# Patient Record
Sex: Male | Born: 1935
Health system: Southern US, Community
[De-identification: ages and names within clinical notes are randomized; demographics above are authoritative.]

## PROBLEM LIST (undated history)

## (undated) DIAGNOSIS — I10 Essential (primary) hypertension: Secondary | ICD-10-CM

## (undated) DIAGNOSIS — C801 Malignant (primary) neoplasm, unspecified: Secondary | ICD-10-CM

## (undated) DIAGNOSIS — R251 Tremor, unspecified: Secondary | ICD-10-CM

## (undated) DIAGNOSIS — G709 Myoneural disorder, unspecified: Secondary | ICD-10-CM

## (undated) DIAGNOSIS — L910 Hypertrophic scar: Secondary | ICD-10-CM

## (undated) DIAGNOSIS — M1990 Unspecified osteoarthritis, unspecified site: Secondary | ICD-10-CM

## (undated) DIAGNOSIS — R413 Other amnesia: Secondary | ICD-10-CM

## (undated) HISTORY — DX: Other amnesia: R41.3

## (undated) HISTORY — PX: HERNIA REPAIR: SHX51

## (undated) HISTORY — PX: EYE SURGERY: SHX253

## (undated) HISTORY — DX: Tremor, unspecified: R25.1

---

## 2000-02-25 ENCOUNTER — Observation Stay (HOSPITAL_COMMUNITY): Admission: RE | Admit: 2000-02-25 | Discharge: 2000-02-26 | Payer: Self-pay | Admitting: General Surgery

## 2000-11-24 ENCOUNTER — Encounter: Admission: RE | Admit: 2000-11-24 | Discharge: 2000-11-24 | Payer: Self-pay | Admitting: Orthopaedic Surgery

## 2000-11-24 ENCOUNTER — Encounter: Payer: Self-pay | Admitting: Orthopaedic Surgery

## 2004-07-16 ENCOUNTER — Ambulatory Visit (HOSPITAL_COMMUNITY): Admission: RE | Admit: 2004-07-16 | Discharge: 2004-07-16 | Payer: Self-pay | Admitting: Urology

## 2004-08-04 ENCOUNTER — Ambulatory Visit: Admission: RE | Admit: 2004-08-04 | Discharge: 2004-08-21 | Payer: Self-pay | Admitting: Radiation Oncology

## 2004-09-16 ENCOUNTER — Ambulatory Visit: Admission: RE | Admit: 2004-09-16 | Discharge: 2004-12-05 | Payer: Self-pay | Admitting: Radiation Oncology

## 2005-01-01 ENCOUNTER — Ambulatory Visit: Admission: RE | Admit: 2005-01-01 | Discharge: 2005-01-01 | Payer: Self-pay | Admitting: Radiation Oncology

## 2011-08-17 ENCOUNTER — Other Ambulatory Visit: Payer: Self-pay | Admitting: Ophthalmology

## 2011-08-18 ENCOUNTER — Other Ambulatory Visit: Payer: Self-pay | Admitting: Ophthalmology

## 2011-08-18 NOTE — H&P (Signed)
H I S T O R Y   A N D   P H Y S I C A L  Patient's Name Todd Holmes Date   08/12/2011  Patient's Age 77 Time 18:19  Medical Record # [id number] Doctor Chalmers Guest   SUBJECTIVE: Referred By:   Chief Complaint: Cataract  Blurry vision at times(maybe due to TrobraDex St  ointment used qhs OU) A-scan OD schedule cat.surgery OD     Onset: Months   Duration:  Intermittent    Quality: Worse   Location:  Right   Intensity / Severity: Moderate    Mental Status: Oriented to Time, Person, Place     REVIEW OF SYSTEMS:     GEN:  Cancer:PROSTATE  Associated/Constitutional: negative.   Endocrine:negative.  Hematologic/Lymphatic:negative.  Allergic/Immunologic:negative.  JYN:WGNFAOZH.  Eyes:  LUNGS/Respiratory:negative.  HEART/Cardiovascular:negative.  ABD/Gastrointestinal:negative.  GENT/Genitourinary:negative.  BJE/Musculoskeletal: reportedArthritis.  SKIN/Integumentary:negative.  NEURO:negative  ACTIVE PROBLEMS: Nuclear sclerosis   ICD#366.16  .OD cataract  causing double vision, along with dryness   with up gaze Hypotropia   ICD#378.32    Drusen (degenerative)   ICD#362.57   Madarosis   ICD#374.55  SURGERIES: Cataract surgery with intraocular lens implant OS July 07, 2006  MEDICATIONS: AzaSite: 1% (solution) SIG-  one drop Ou qhs    TobraDex ST: Strength-  SIG-  OU qhs  DISCONTINUED MEDICATIONS:  not found  ALLERGIES: Drug Allergies.  No Known.  INTERVENTIONS: Refraction: 10.25.2012 OD: +2.50 + .050 x 150.....20/40 OS: +0.50 sph..................20/20 Add: 3.00  OD plus 1.25 combine with plus 0.50 axis 150 20/40 OS plus 0.25 combined with plus 0.50 axis 152 20/20                     add +3.00  SOCIAL HISTORY: Retired Mudlogger  OBJECTIVE: Vision:   OD: Acuity: With Correction -2 20/50 PinHole: 20/40  OS: Acuity: With Correction 20/20   Habitual Prescription: OD: +1.25 + 0.50 x 150 OS: +0.25 + 0.50 x  152 Add:  AutoRefraction: OD:  OS:  Refraction: 10.25.2012 OD: +2.50 + .050 x 150.....20/40 OS: +0.50 sph..................20/20 Add: 3.00  Visual  Fields: OD:  Visual Fields: Full To Confrontation OS:  Visual Fields: Full To Confrontation  Lids and Lashes:   Clear   Motility: Full with mild hypotropia upgage worse with head tilted to the right    SLIT-LAMP EXAMINATION  Conjunctiva OD: Conjunctiva:  Clear  OS: Conjunctiva:  Clear   Pupils: OD: Pupils:   3mm __ millimeters not detected Page Spiro   OS: Pupils:   3mm    __ millimeters not detected Page Spiro  Iris: OD: IrisManson Passey  OS: Iris:  Brown   Cornea: OD: Cornea: Arcus  OS: Cornea: Arcus    Anterior Chamber: OD: Anterior Chamber  deep & quiet  OS: Anterior Chamber  deep & quiet   Lens: OD: 2+ -3+  Nuclear Sclerotic Anterior Vacular  OS: Posterior Chamber IntraOcular Lens   Vitreous: OD: Vitreous detachment   ICD#379.21    OS:  Vitreous:   POSTERIOR VITREOUS DETACHMENT #379.21.   Intra-Ocular Pressures in mm of Mercury:  Target: Today: OD:12 OS:12 Time:08/12/2011 16:52   Central Corneal Thickness:  Gonio: OD: Gonio Open 360 Degrees to Scleral Spur   OS: Gonio Open 360 Degrees to Scleral Spur    Dilation Phenylephrine 2.5% Tropicamide   Optic Discs: OD: Optic Discs 25% cup pink OS: Optic Discs  25% cup pink  Macula: OD: Macula:  drusen OS: Macula: Drusen   Retina and Vessels:drusen OU OD: Retina and Vessels Clear OS: Retina and Vessels  Clear  Periphery: OD: Periphery Clear OS: Periphery Clear      PHYSICAL  Blood Pressure:130/82  Pulse:68  Respiration:20  GENERAL: No acute distress  HEENT: MM's WNL, TM's normal, PERLA, Conjunctiva WNL, Fundi  As Before  note above  NECK: Full ROM, no adenopathy or masses, Thyroid WNL  LUNGS: Clear to auscultation, equal BS  HEART: RR&R, no murmur, gallop, or rub, not enlarged  ABD: Normal  GENT: deferred  BJE:  normal  NEURO: CN II through XII intact  SKIN: normal  STUDIES:  A-Scan  ASSESSMENT: Nuclear sclerosis   ICD#366.16  .OD cataract  causing double vision, along with dryness   with up gaze Hypotropia   ICD#378.32    Drusen (degenerative)   ICD#362.57   Madarosis   ICD#374.55  PLAN: A-scan, k's today  Discussed the procedure with the patient and he decided to proceed.  Schedule for phacoemulsification with intraocular lens implant [facility] on August 26, 2011.    MEDICATION: ocuvite  TobraDex ST: Strength-  SIG-  OU qhs Dispense-   Substitutions-  Refills- 0 Notes-  FOLLOW UP:    One Day PostOp    Printed on 08/18/2011 at 18:19   Blake Divine MD

## 2011-08-25 ENCOUNTER — Encounter (HOSPITAL_COMMUNITY): Payer: Self-pay

## 2011-08-25 MED ORDER — PHENYLEPHRINE HCL 2.5 % OP SOLN: 1.0000 [drp] | OPHTHALMIC | Status: AC

## 2011-08-25 MED ORDER — KETOROLAC TROMETHAMINE 0.5 % OP SOLN: 1.0000 [drp] | OPHTHALMIC | Status: AC

## 2011-08-25 MED ORDER — CYCLOPENTOLATE-PHENYLEPHRINE 0.2-1 % OP SOLN: 1.0000 [drp] | OPHTHALMIC | Status: AC

## 2011-08-26 ENCOUNTER — Encounter (HOSPITAL_COMMUNITY): Payer: Self-pay

## 2011-08-26 ENCOUNTER — Other Ambulatory Visit: Payer: Self-pay

## 2011-08-26 ENCOUNTER — Ambulatory Visit (HOSPITAL_COMMUNITY)
Admission: RE | Admit: 2011-08-26 | Discharge: 2011-08-26 | Disposition: A | Discharge status: Home / Self Care | Payer: Medicare Other | Source: Ambulatory Visit | Attending: Ophthalmology | Admitting: Ophthalmology

## 2011-08-26 DIAGNOSIS — Z538 Procedure and treatment not carried out for other reasons: Secondary | ICD-10-CM | POA: Insufficient documentation

## 2011-08-26 DIAGNOSIS — H269 Unspecified cataract: Secondary | ICD-10-CM | POA: Insufficient documentation

## 2011-08-26 DIAGNOSIS — Z01810 Encounter for preprocedural cardiovascular examination: Secondary | ICD-10-CM | POA: Insufficient documentation

## 2011-08-26 HISTORY — DX: Myoneural disorder, unspecified: G70.9

## 2011-08-26 HISTORY — DX: Hypertrophic scar: L91.0

## 2011-08-26 HISTORY — DX: Unspecified osteoarthritis, unspecified site: M19.90

## 2011-08-26 HISTORY — DX: Malignant (primary) neoplasm, unspecified: C80.1

## 2011-08-26 MED ORDER — PHENYLEPHRINE HCL 2.5 % OP SOLN
OPHTHALMIC | Status: AC
  Filled 2011-08-26: qty 3

## 2011-08-26 MED ORDER — CYCLOPENTOLATE-PHENYLEPHRINE 0.2-1 % OP SOLN
OPHTHALMIC | Status: AC
  Filled 2011-08-26: qty 2

## 2011-08-26 MED ORDER — KETOROLAC TROMETHAMINE 0.5 % OP SOLN
OPHTHALMIC | Status: AC
  Filled 2011-08-26: qty 5

## 2011-08-26 NOTE — Progress Notes (Signed)
Asked A. Zelenak,PAC to review EKG, per pt. last ekg- 1980's. Pt .reports that he regularly sees Dr. Earl Gala, [facility] Fearing

## 2011-08-26 NOTE — Progress Notes (Signed)
Pt. Being d/c'd today, surgery not done to sch. Problem, per  Dr Harlon Flor.  Pt. Aware that MD will followup with date & time.

## 2011-08-26 NOTE — Patient Instructions (Signed)
Pt. Informed that surgery is going to be rescheduled for January 2, MD office  will call pt. To confirm & pt. Is aware.

## 2011-08-28 NOTE — Consult Note (Addendum)
Anesthesia:  Patient is an 77 year old male who was initially scheduled for phacoemulsification with intraocular lens for a right cataract on 08/26/11.  There were some OR delays however, and Dr. Harlon Flor decided to reschedule the procedure for 09/16/11.  An EKG was done as part of his preoperative evaluation, but other testing was deferred since his procedure was canceled.  I was asked to review his EKG showing SB with bifascicular block, LVH with QRS widening and repolarization abnormality.  He has rather diffuse T wave abnormality, but more prominent in lateral leads.  These findings appear similar to an old EKG from 02/23/00 which was done prior to him undergoing left IHR.  He did not report any other cardiac studies.  His history includes OSA, OA, GERD, prostate CA, and benign tremors.  Dr. Theressa Millard is his PCP.  He was prescribed Corgard for his benign tremors, not for history of HTN.  I reviewed his history and prior EKGs with Dr. Jean Rosenthal, who agrees that since EKGs are stable, Anesthesia would not require any additional cardiac testing preoperatively unless he developed new CV symptoms.  I was unable to reach him today, so I will attempt to contact him next week.    CXR and labs to be done when he returns for PAT appointment or surgery.  Addendum:  09/01/11 1700 I called to speak with Mr. Thayer Jew.  He reports a prior stress test but over five years ago.  When asked about CV symptoms he reports an occasional left sided chest "ache" that typically only lasts a few seconds up to a minute.  This occurs about once per month over the past several months.  Since his symptoms were so mild and rather infrequent, he has not been impressed to discuss them with Dr. Earl Gala.  He has not noticed a particular pattern with his symptoms, and they resolved spontaneously.  He has not had any associated jaw or left arm pain, diaphoresis, nausea, or palpitations.  He has had occasional left back pain located at the  location of his scapula, but this has not been associated with other symptoms.  He does not do daily aerobic activity, but walks laps at the mall on occasion without difficulty.  This summer he was able to mow his lawn with a self propelled push lawn mower.  He did not have any CP with this, and only described becoming a little SOB if it was humid and he was going up an incline.  He is asymptomatic currently.  He is aware that he should contact Dr. Earl Gala or EMS in the event of persistent or worsening symptoms.  I reviewed above and his prior EKGs with Dr. Ivin Booty.  This procedure is posted to be done under MAC.  Again, his EKG is felt overall stable, and he has reasonable exercise tolerance.  At this point, Anesthesia feels Mr. Pantaleo can proceed to the OR if his labs are CXR results are acceptable and if he does not have progression of his symptoms.

## 2011-09-10 ENCOUNTER — Other Ambulatory Visit: Payer: Self-pay | Admitting: Ophthalmology

## 2011-09-10 NOTE — H&P (Signed)
   Chief Complaint: Cataract  Blurry vision OD   Onset: Months   Duration:  Intermittent    Quality: Worse   Location:  Right   Intensity / Severity: Moderate    Mental Status: Oriented to Time, Person, Place     REVIEW OF SYSTEMS:     GEN:  Cancer:PROSTATE  Associated/Constitutional: negative.   Endocrine:negative.  Hematologic/Lymphatic:negative.  Allergic/Immunologic:negative.  BJY:NWGNFAOZ.  Eyes:  LUNGS/Respiratory:negative.  HEART/Cardiovascular:negative.  ABD/Gastrointestinal:negative.  GENT/Genitourinary:negative.  BJE/Musculoskeletal: (+)Arthritis.  SKIN/Integumentary:negative.  NEURO:negative  OBjECTIVE:   Vision:   OD: Acuity: With Correction -2 20/50 PinHole: 20/40  OS: Acuity: With Correction 20/20   Habitual Prescription: OD: +1.25 + 0.50 x 150 OS: +0.25 + 0.50 x 152 Add:  AutoRefraction: OD:  OS:  Refraction: 10.25.2012 OD: +2.50 + .050 x 150.....20/40 OS: +0.50 sph..................20/20 Add: 3.00  Visual  Fields: OD:  Visual Fields: Full To Confrontation OS:  Visual Fields: Full To Confrontation  Lids and Lashes:   Clear   Motility: Full with mild hypotropia upgage worse with head tilted to the right    SLIT-LAMP EXAMINATION  Conjunctiva OD: Conjunctiva:  Clear  OS: Conjunctiva:  Clear   Pupils: OD: Pupils:   3mm __ millimeters (-) Page Spiro   OS: Pupils:   3mm    __ millimeters (-) Page Spiro  Iris: OD: IrisManson Passey  OS: Iris:  Brown   Cornea: OD: Cornea: Arcus  OS: Cornea: Arcus    Anterior Chamber: OD: Anterior Chamber  deep & quiet  OS: Anterior Chamber  deep & quiet   Lens: OD: 2+ -3+  Nuclear Sclerotic Anterior Vacular  OS: Posterior Chamber IntraOcular Lens   Vitreous: OD: Vitreous detachment   ICD#379.21    OS:  Vitreous:   POSTERIOR VITREOUS DETACHMENT #379.21.   Intra-Ocular Pressures in mm of Mercury: OD:12 OS:12 Time:08/12/2011 16:52   Gonio: OD: Gonio Open 360 Degrees to Scleral Spur     OS: Gonio Open 360 Degrees to Scleral Spur    Dilation Phenylephrine 2.5% Tropicamide   Optic Discs: OD: Optic Discs 25% cup pink OS: Optic Discs  25% cup pink  Macula: OD: Macula: drusen OS: Macula: Drusen   Retina and Vessels:drusen OU OD: Retina and Vessels Clear OS: Retina and Vessels  Clear  Periphery: OD: Periphery Clear OS: Periphery Clear      PHYSICAL  Blood Pressure:130/82  Pulse:68  Respiration:20  GENERAL: No acute distress  HEENT: MM's WNL, TM's normal, PERLA, Conjunctiva WNL, Fundi  As Before  note above  NECK: Full ROM, no adenopathy or masses, Thyroid WNL  LUNGS: Clear to auscultation, equal BS  HEART: RR&R, no murmur, gallop, or rub, not enlarged  ABD: Normal  GENT: deferred  BJE: normal  NEURO: CN II through XII intact  SKIN: normal  STUDIES:  A-Scan     ASSESSMENT:   Nuclear sclerosis   ICD#366.16  .OD cataract  causing double vision, along with dryness   with up gaze Hypotropia   ICD#378.32    Drusen (degenerative)   ICD#362.57   Madarosis   ICD#374.55         PLAN:  A-scan, k's today  Discussed the procedure with the patient and he decided to proceed.  Schedule for phacoemulsification with intraocular lens implant OD Hospital.   FOLLOW UP:  One Day PostOp

## 2011-09-11 ENCOUNTER — Encounter (HOSPITAL_COMMUNITY): Payer: Self-pay

## 2011-09-11 ENCOUNTER — Encounter (HOSPITAL_COMMUNITY)
Admission: RE | Admit: 2011-09-11 | Discharge: 2011-09-11 | Disposition: A | Discharge status: Home / Self Care | Payer: Medicare Other | Source: Ambulatory Visit | Attending: Ophthalmology | Admitting: Ophthalmology

## 2011-09-11 ENCOUNTER — Other Ambulatory Visit (HOSPITAL_COMMUNITY): Payer: Self-pay | Admitting: *Deleted

## 2011-09-11 ENCOUNTER — Ambulatory Visit (HOSPITAL_COMMUNITY)
Admission: RE | Admit: 2011-09-11 | Discharge: 2011-09-11 | Disposition: A | Discharge status: Home / Self Care | Payer: Medicare Other | Source: Ambulatory Visit | Attending: Anesthesiology | Admitting: Anesthesiology

## 2011-09-11 DIAGNOSIS — Z01818 Encounter for other preprocedural examination: Secondary | ICD-10-CM | POA: Insufficient documentation

## 2011-09-11 DIAGNOSIS — I77819 Aortic ectasia, unspecified site: Secondary | ICD-10-CM | POA: Insufficient documentation

## 2011-09-11 DIAGNOSIS — M4120 Other idiopathic scoliosis, site unspecified: Secondary | ICD-10-CM | POA: Insufficient documentation

## 2011-09-11 DIAGNOSIS — Z01812 Encounter for preprocedural laboratory examination: Secondary | ICD-10-CM | POA: Insufficient documentation

## 2011-09-11 LAB — BASIC METABOLIC PANEL
BUN: 14 mg/dL (ref 6–23)
CO2: 27 mEq/L (ref 19–32)
Calcium: 9.5 mg/dL (ref 8.4–10.5)
Glucose, Bld: 109 mg/dL — ABNORMAL HIGH (ref 70–99)
Sodium: 143 mEq/L (ref 135–145)

## 2011-09-11 LAB — CBC
MCH: 29.6 pg (ref 26.0–34.0)
MCHC: 33.8 g/dL (ref 30.0–36.0)
MCV: 87.8 fL (ref 78.0–100.0)
Platelets: 166 10*3/uL (ref 150–400)
RBC: 5.33 MIL/uL (ref 4.22–5.81)

## 2011-09-11 NOTE — Pre-Procedure Instructions (Addendum)
22 Todd Holmes  09/11/2011   Your procedure is scheduled on:  09/16/11  Report to [facility] at 800AM.  Call this number if you have problems the morning of surgery: [phone]   Remember:   Do not eat food:After Midnight.  May have clear liquids: up to 4 Hours before arrival.  Clear liquids include soda, tea, black coffee, apple or grape juice, broth.  Take these medicines the morning of surgery with A SIP OF WATER  :corgard, claritin   Do not wear jewelry, make-up or nail polish.  Do not wear lotions, powders, or perfumes. You may wear deodorant.  Do not shave 48 hours prior to surgery.  Do not bring valuables to the hospital.  Contacts, dentures or bridgework may not be worn into surgery.  Leave suitcase in the car. After surgery it may be brought to your room.  For patients admitted to the hospital, checkout time is 11:00 AM the day of discharge.   Patients discharged the day of surgery will not be allowed to drive home.  Name and phone number of your driver:earnie cain [phone]  Special Instructions: CHG Shower Use Special Wash: 1/2 bottle night before surgery and 1/2 bottle morning of surgery.   Please read over the following fact sheets that you were given: Pain Booklet, Coughing and Deep Breathing, MRSA Information and Surgical Site Infection Prevention

## 2011-09-11 NOTE — Progress Notes (Signed)
Patients surgery changed to 09/23/11 per pt req per office

## 2011-09-16 SURGERY — PHACOEMULSIFICATION, CATARACT, WITH IOL INSERTION
Anesthesia: Monitor Anesthesia Care | Laterality: Right

## 2011-09-18 ENCOUNTER — Other Ambulatory Visit: Payer: Self-pay | Admitting: Ophthalmology

## 2011-09-21 ENCOUNTER — Encounter (HOSPITAL_COMMUNITY): Payer: Self-pay | Admitting: *Deleted

## 2011-09-21 NOTE — Progress Notes (Signed)
Told patient to be here at 1000 on 09/23/2011 for surgery.  Patient has instructions that were given in preop.

## 2011-09-23 ENCOUNTER — Encounter (HOSPITAL_COMMUNITY)
Admission: RE | Disposition: A | Discharge status: Home / Self Care | Payer: Self-pay | Source: Ambulatory Visit | Attending: Ophthalmology

## 2011-09-23 ENCOUNTER — Encounter (HOSPITAL_COMMUNITY): Payer: Self-pay | Admitting: *Deleted

## 2011-09-23 ENCOUNTER — Ambulatory Visit (HOSPITAL_COMMUNITY)
Admission: RE | Admit: 2011-09-23 | Discharge: 2011-09-23 | Disposition: A | Discharge status: Home / Self Care | Payer: Medicare Other | Source: Ambulatory Visit | Attending: Ophthalmology | Admitting: Ophthalmology

## 2011-09-23 ENCOUNTER — Encounter (HOSPITAL_COMMUNITY): Payer: Self-pay | Admitting: Vascular Surgery

## 2011-09-23 ENCOUNTER — Ambulatory Visit (HOSPITAL_COMMUNITY): Payer: Medicare Other | Admitting: Vascular Surgery

## 2011-09-23 DIAGNOSIS — H269 Unspecified cataract: Secondary | ICD-10-CM | POA: Diagnosis not present

## 2011-09-23 DIAGNOSIS — H35369 Drusen (degenerative) of macula, unspecified eye: Secondary | ICD-10-CM | POA: Diagnosis not present

## 2011-09-23 DIAGNOSIS — H5052 Exophoria: Secondary | ICD-10-CM | POA: Diagnosis not present

## 2011-09-23 DIAGNOSIS — H2510 Age-related nuclear cataract, unspecified eye: Secondary | ICD-10-CM | POA: Diagnosis not present

## 2011-09-23 HISTORY — PX: CATARACT EXTRACTION W/PHACO: SHX586

## 2011-09-23 SURGERY — PHACOEMULSIFICATION, CATARACT, WITH IOL INSERTION
Anesthesia: Monitor Anesthesia Care | Site: Eye | Laterality: Right | Wound class: Clean

## 2011-09-23 MED ORDER — PROVISC 10 MG/ML IO SOLN
INTRAOCULAR | Status: DC | PRN
  Administered 2011-09-23: .85 mL via INTRAOCULAR

## 2011-09-23 MED ORDER — KETOROLAC TROMETHAMINE 0.5 % OP SOLN
OPHTHALMIC | Status: AC
  Administered 2011-09-23 (×2): 1 [drp]
  Filled 2011-09-23: qty 5

## 2011-09-23 MED ORDER — TOBRAMYCIN 0.3 % OP OINT
TOPICAL_OINTMENT | OPHTHALMIC | Status: DC | PRN
  Administered 2011-09-23: 1 via OPHTHALMIC

## 2011-09-23 MED ORDER — GATIFLOXACIN 0.5 % OP SOLN
OPHTHALMIC | Status: AC
  Administered 2011-09-23 (×2): 1 [drp]
  Filled 2011-09-23: qty 2.5

## 2011-09-23 MED ORDER — CYCLOPENTOLATE HCL 1 % OP SOLN
OPHTHALMIC | Status: AC
  Administered 2011-09-23 (×2): 1 [drp]
  Filled 2011-09-23: qty 2

## 2011-09-23 MED ORDER — NA CHONDROIT SULF-NA HYALURON 40-30 MG/ML IO SOLN
INTRAOCULAR | Status: DC | PRN
  Administered 2011-09-23: 0.5 mL via INTRAOCULAR

## 2011-09-23 MED ORDER — BALANCED SALT IO SOLN
INTRAOCULAR | Status: DC | PRN
  Administered 2011-09-23: 15 mL via INTRAOCULAR

## 2011-09-23 MED ORDER — HYALURONIDASE HUMAN 150 UNIT/ML IJ SOLN
INTRAMUSCULAR | Status: DC | PRN
  Administered 2011-09-23: 150 [IU] via SUBCUTANEOUS

## 2011-09-23 MED ORDER — PHENYLEPHRINE HCL 2.5 % OP SOLN
OPHTHALMIC | Status: AC
  Administered 2011-09-23 (×2): 1 [drp]
  Filled 2011-09-23: qty 3

## 2011-09-23 MED ORDER — TROPICAMIDE 1 % OP SOLN
OPHTHALMIC | Status: AC
  Administered 2011-09-23 (×2): 1 [drp]
  Filled 2011-09-23: qty 3

## 2011-09-23 MED ORDER — FENTANYL CITRATE 0.05 MG/ML IJ SOLN
INTRAMUSCULAR | Status: DC | PRN
  Administered 2011-09-23: 25 ug via INTRAVENOUS

## 2011-09-23 MED ORDER — PROPOFOL 10 MG/ML IV EMUL
INTRAVENOUS | Status: DC | PRN
  Administered 2011-09-23: 75 mg via INTRAVENOUS

## 2011-09-23 MED ORDER — BSS IO SOLN
INTRAOCULAR | Status: DC | PRN
  Administered 2011-09-23: 500 mL via INTRAOCULAR

## 2011-09-23 MED ORDER — LACTATED RINGERS IV SOLN: INTRAVENOUS | Status: DC

## 2011-09-23 MED ORDER — LIDOCAINE-EPINEPHRINE 2 %-1:100000 IJ SOLN
INTRAMUSCULAR | Status: DC | PRN
  Administered 2011-09-23: 4 mL

## 2011-09-23 MED ORDER — MIDAZOLAM HCL 5 MG/5ML IJ SOLN
INTRAMUSCULAR | Status: DC | PRN
  Administered 2011-09-23: 0.5 mg via INTRAVENOUS

## 2011-09-23 MED ORDER — SODIUM CHLORIDE 0.9 % IV SOLN
INTRAVENOUS | Status: DC | PRN
  Administered 2011-09-23: 12:00:00 via INTRAVENOUS
  Administered 2011-09-23: 500 mL

## 2011-09-23 MED ORDER — EPINEPHRINE HCL 1 MG/ML IJ SOLN
INTRAMUSCULAR | Status: DC | PRN
  Administered 2011-09-23: .3 mL

## 2011-09-23 MED ORDER — BUPIVACAINE HCL 0.75 % IJ SOLN
INTRAMUSCULAR | Status: DC | PRN
  Administered 2011-09-23: 4 mL

## 2011-09-23 MED ORDER — ACETYLCHOLINE CHLORIDE 1:100 IO SOLR
INTRAOCULAR | Status: DC | PRN
  Administered 2011-09-23: 2 mg via INTRAOCULAR

## 2011-09-23 SURGICAL SUPPLY — 41 items
APPLICATOR COTTON TIP 6IN STRL (MISCELLANEOUS) ×2 IMPLANT
APPLICATOR DR MATTHEWS STRL (MISCELLANEOUS) ×2 IMPLANT
BLADE KERATOME 2.75 (BLADE) ×2 IMPLANT
BLADE MINI RND TIP GREEN BEAV (BLADE) IMPLANT
BLADE STAB KNIFE 45DEG (BLADE) IMPLANT
CANNULA ANTERIOR CHAMBER 27GA (MISCELLANEOUS) ×2 IMPLANT
CLOTH BEACON ORANGE TIMEOUT ST (SAFETY) ×2 IMPLANT
CORDS BIPOLAR (ELECTRODE) IMPLANT
DRAPE OPHTHALMIC 40X48 W POUCH (DRAPES) ×2 IMPLANT
DRAPE RETRACTOR (MISCELLANEOUS) ×2 IMPLANT
EYE SHIELD UNIVERSAL CLEAR (GAUZE/BANDAGES/DRESSINGS) ×2 IMPLANT
FILTER BLUE MILLIPORE (MISCELLANEOUS) IMPLANT
GLOVE BIO SURGEON STRL SZ8 (GLOVE) ×2 IMPLANT
GLOVE BIO SURGEON STRL SZ8.5 (GLOVE) IMPLANT
GLOVE BIOGEL PI IND STRL 6.5 (GLOVE) ×1 IMPLANT
GLOVE BIOGEL PI INDICATOR 6.5 (GLOVE) ×1
GLOVE ECLIPSE 7.0 STRL STRAW (GLOVE) IMPLANT
GOWN STRL NON-REIN LRG LVL3 (GOWN DISPOSABLE) ×4 IMPLANT
KIT ROOM TURNOVER OR (KITS) ×2 IMPLANT
KNIFE CRESCENT 2.5 55 ANG (BLADE) ×2 IMPLANT
LENS IOL ACRSF IQ PC 20.5 (Intraocular Lens) ×1 IMPLANT
LENS IOL ACRYSOF IQ POST 20.5 (Intraocular Lens) ×2 IMPLANT
MARKER SKIN DUAL TIP RULER LAB (MISCELLANEOUS) ×2 IMPLANT
NEEDLE 18GX1X1/2 (RX/OR ONLY) (NEEDLE) ×2 IMPLANT
NEEDLE 25GX 5/8IN NON SAFETY (NEEDLE) ×2 IMPLANT
NS IRRIG 1000ML POUR BTL (IV SOLUTION) ×2 IMPLANT
PACK CATARACT CUSTOM (CUSTOM PROCEDURE TRAY) ×2 IMPLANT
PACK CATARACT MCHSCP (PACKS) ×2 IMPLANT
PAD ARMBOARD 7.5X6 YLW CONV (MISCELLANEOUS) ×2 IMPLANT
PAD EYE OVAL STERILE LF (GAUZE/BANDAGES/DRESSINGS) ×2 IMPLANT
PROBE ANTERIOR 20G W/INFUS NDL (MISCELLANEOUS) IMPLANT
SPEAR EYE SURG WECK-CEL (MISCELLANEOUS) IMPLANT
SUT ETHILON 10 0 CS140 6 (SUTURE) IMPLANT
SUT SILK 4 0 C 3 735G (SUTURE) IMPLANT
SUT SILK 6 0 G 6 (SUTURE) IMPLANT
SUT VICRYL 8 0 TG140 8 (SUTURE) IMPLANT
SYR 3ML LL SCALE MARK (SYRINGE) IMPLANT
SYR TB 1ML LUER SLIP (SYRINGE) ×2 IMPLANT
TAPE TRANSPARENT 1/2IN (GAUZE/BANDAGES/DRESSINGS) ×2 IMPLANT
TOWEL OR 17X24 6PK STRL BLUE (TOWEL DISPOSABLE) ×4 IMPLANT
WATER STERILE IRR 1000ML POUR (IV SOLUTION) ×2 IMPLANT

## 2011-09-23 NOTE — Anesthesia Preprocedure Evaluation (Addendum)
Anesthesia Evaluation  Patient identified by MRN, date of birth, ID band  Reviewed: Allergy & Precautions, H&P , NPO status , Patient's Chart, lab work & pertinent test results  Airway Mallampati: II      Dental   Pulmonary neg pulmonary ROS,  clear to auscultation        Cardiovascular Regular Normal    Neuro/Psych Negative Psych ROS   GI/Hepatic negative GI ROS, Neg liver ROS,   Endo/Other  Negative Endocrine ROS  Renal/GU negative Renal ROS   History of prostrate cancer with radiation.    Musculoskeletal   Abdominal   Peds  Hematology   Anesthesia Other Findings   Reproductive/Obstetrics                          Anesthesia Physical Anesthesia Plan  ASA: II  Anesthesia Plan: MAC   Post-op Pain Management:    Induction: Intravenous  Airway Management Planned: Mask  Additional Equipment:   Intra-op Plan:   Post-operative Plan:   Informed Consent: I have reviewed the patients History and Physical, chart, labs and discussed the procedure including the risks, benefits and alternatives for the proposed anesthesia with the patient or authorized representative who has indicated his/her understanding and acceptance.     Plan Discussed with: CRNA  Anesthesia Plan Comments:         Anesthesia Quick Evaluation

## 2011-09-23 NOTE — Interval H&P Note (Signed)
History and Physical Interval Note:  09/23/2011 12:28 PM  Todd Holmes  has presented today for surgery, with the diagnosis of cataract right eye  The various methods of treatment have been discussed with the patient and family. After consideration of risks, benefits and other options for treatment, the patient has consented to  Procedure(s): CATARACT EXTRACTION PHACO AND INTRAOCULAR LENS PLACEMENT (IOL) as a surgical intervention .  The patients' history has been reviewed, patient examined, no change in status, stable for surgery.  I have reviewed the patients' chart and labs.  Questions were answered to the patient's satisfaction.     Todd Holmes

## 2011-09-23 NOTE — Preoperative (Addendum)
Beta Blockers   Reason not to administer Beta Blockers:Not Applicable 

## 2011-09-23 NOTE — H&P (View-Only) (Signed)
   Chief Complaint: Cataract  Blurry vision OD   Onset: Months   Duration:  Intermittent    Quality: Worse   Location:  Right   Intensity / Severity: Moderate    Mental Status: Oriented to Time, Person, Place     REVIEW OF SYSTEMS:     GEN:  Cancer:PROSTATE  Associated/Constitutional: negative.   Endocrine:negative.  Hematologic/Lymphatic:negative.  Allergic/Immunologic:negative.  ENT:negative.  Eyes:  LUNGS/Respiratory:negative.  HEART/Cardiovascular:negative.  ABD/Gastrointestinal:negative.  GENT/Genitourinary:negative.  BJE/Musculoskeletal: (+)Arthritis.  SKIN/Integumentary:negative.  NEURO:negative  OBjECTIVE:   Vision:   OD: Acuity: With Correction -2 20/50 PinHole: 20/40  OS: Acuity: With Correction 20/20   Habitual Prescription: OD: +1.25 + 0.50 x 150 OS: +0.25 + 0.50 x 152 Add:  AutoRefraction: OD:  OS:  Refraction: 10.25.2012 OD: +2.50 + .050 x 150.....20/40 OS: +0.50 sph..................20/20 Add: 3.00  Visual  Fields: OD:  Visual Fields: Full To Confrontation OS:  Visual Fields: Full To Confrontation  Lids and Lashes:   Clear   Motility: Full with mild hypotropia upgage worse with head tilted to the right    SLIT-LAMP EXAMINATION  Conjunctiva OD: Conjunctiva:  Clear  OS: Conjunctiva:  Clear   Pupils: OD: Pupils:   3mm __ millimeters (-) Marcus Gunn   OS: Pupils:   3mm    __ millimeters (-) Marcus Gunn  Iris: OD: Iris:  Brown  OS: Iris:  Brown   Cornea: OD: Cornea: Arcus  OS: Cornea: Arcus    Anterior Chamber: OD: Anterior Chamber  deep & quiet  OS: Anterior Chamber  deep & quiet   Lens: OD: 2+ -3+  Nuclear Sclerotic Anterior Vacular  OS: Posterior Chamber IntraOcular Lens   Vitreous: OD: Vitreous detachment   ICD#379.21    OS:  Vitreous:   POSTERIOR VITREOUS DETACHMENT #379.21.   Intra-Ocular Pressures in mm of Mercury: OD:12 OS:12 Time:08/12/2011 16:52   Gonio: OD: Gonio Open 360 Degrees to Scleral Spur     OS: Gonio Open 360 Degrees to Scleral Spur    Dilation Phenylephrine 2.5% Tropicamide   Optic Discs: OD: Optic Discs 25% cup pink OS: Optic Discs  25% cup pink  Macula: OD: Macula: drusen OS: Macula: Drusen   Retina and Vessels:drusen OU OD: Retina and Vessels Clear OS: Retina and Vessels  Clear  Periphery: OD: Periphery Clear OS: Periphery Clear      PHYSICAL  Blood Pressure:130/82  Pulse:68  Respiration:20  GENERAL: No acute distress  HEENT: MM's WNL, TM's normal, PERLA, Conjunctiva WNL, Fundi  As Before  note above  NECK: Full ROM, no adenopathy or masses, Thyroid WNL  LUNGS: Clear to auscultation, equal BS  HEART: RR&R, no murmur, gallop, or rub, not enlarged  ABD: Normal  GENT: deferred  BJE: normal  NEURO: CN II through XII intact  SKIN: normal  STUDIES:  A-Scan     ASSESSMENT:   Nuclear sclerosis   ICD#366.16  .OD cataract  causing double vision, along with dryness   with up gaze Hypotropia   ICD#378.32    Drusen (degenerative)   ICD#362.57   Madarosis   ICD#374.55         PLAN:  A-scan, k's today  Discussed the procedure with the patient and he decided to proceed.  Schedule for phacoemulsification with intraocular lens implant OD Hospital.   FOLLOW UP:  One Day PostOp   

## 2011-09-23 NOTE — Transfer of Care (Signed)
Immediate Anesthesia Transfer of Care Note  Patient: Todd Holmes  Procedure(s) Performed:  CATARACT EXTRACTION PHACO AND INTRAOCULAR LENS PLACEMENT (IOC)  Patient Location: PACU and Short Stay  Anesthesia Type: MAC  Level of Consciousness: awake, alert  and oriented  Airway & Oxygen Therapy: Patient Spontanous Breathing  Post-op Assessment: Report given to PACU RN and Post -op Vital signs reviewed and stable  Post vital signs: Reviewed and stable  Complications: No apparent anesthesia complications

## 2011-09-23 NOTE — Op Note (Signed)
Operative note Preoperative diagnosis vision significant cataract right eye  postoperative diagnosis: same Anesthesia consisted of 2% Xylocaine with epinephrine a 50-50 mixture 0.75% Marcaine with ample Wydase Complications: None Assistant: Mindy Procedure: The patient was taken to the operating room where he was given a peribulbar block with the aforementioned local anesthetic agent under monitored anesthesia following this the patient's face was prepped and draped in the usual sterile fashion. With the operating microscope and positioned in the surgeon sitting temporally a . lid speculum was inserted and Weck-Cel sponges used to fixate the globe and a 15 blade was used to enter through superior clear cornea at the 11:00 position Viscoat was injected in the eye following this an additional Weck-Cel sponges used to fixate the eye and a 2.75 mm keratome blade was used to stepwise fashion through temporal clear cornea to into the eye. A bent 25-gauge needle was used to incise anterior capsule and a continuous tear curvilinear capsulorrhexis was formed. BSS was used to hydrodissect and hydrodelineate the nucleus following this the phacoemulsification unit was used to sculpt the nucleus and snap the nucleus into 3 quadrants the nuclear fragments were removed following this the I/A was then used to  strip cortical fibers from the posterior capsule all cortex was removed except subincisional cortex the posterior capsule remained intact therefore Provisc was injected in the eye and intraocular lens implant was examined and noted to have no defect it was an Alcon AcrySof SN 60 WF 20.5 diopter lens SN #[id number]. The lens was injected with the lens injector it was unfolded in position with a Kuglen hook the eye was then used to remove subincisional cortex and viscoelastic from the eye Miostat was injected in the eye the eye was pressurized the incision was hydrated there being no leakage of instrumentation was  removed from the eye topical TobraDex ointment was applied a patch and Fox shield were placed and the patient returned to recovery area in stable condition  Blake Divine M.D.

## 2011-09-23 NOTE — Progress Notes (Signed)
ALL EYE DROPS WERE GIVEN AS ORDERED 1120 -1124 AM TODAY .Marland Kitchen.09/23/2011.  BARCODE SCANNER WILL NOT ACCEPT THE SCAN... THE DROPS WERE COMPLETED AT THIS ABOVE STATED TIME.

## 2011-09-24 ENCOUNTER — Encounter (HOSPITAL_COMMUNITY): Payer: Self-pay | Admitting: Ophthalmology

## 2011-09-24 NOTE — Anesthesia Postprocedure Evaluation (Signed)
  Anesthesia Post-op Note  Patient: Todd Holmes  Procedure(s) Performed:  CATARACT EXTRACTION PHACO AND INTRAOCULAR LENS PLACEMENT (IOC)  Patient Location: PACU  Anesthesia Type: MAC  Level of Consciousness: awake  Airway and Oxygen Therapy: Patient Spontanous Breathing  Post-op Pain: mild  Post-op Assessment: Post-op Vital signs reviewed  Post-op Vital Signs: stable  Complications: No apparent anesthesia complications

## 2012-02-15 DIAGNOSIS — M7989 Other specified soft tissue disorders: Secondary | ICD-10-CM | POA: Diagnosis not present

## 2012-02-15 DIAGNOSIS — M79609 Pain in unspecified limb: Secondary | ICD-10-CM | POA: Diagnosis not present

## 2012-02-17 ENCOUNTER — Other Ambulatory Visit: Payer: Self-pay | Admitting: Internal Medicine

## 2012-02-17 ENCOUNTER — Ambulatory Visit
Admission: RE | Admit: 2012-02-17 | Discharge: 2012-02-17 | Disposition: A | Discharge status: Home / Self Care | Payer: Medicare Other | Source: Ambulatory Visit | Attending: Internal Medicine | Admitting: Internal Medicine

## 2012-02-17 DIAGNOSIS — M79609 Pain in unspecified limb: Secondary | ICD-10-CM | POA: Diagnosis not present

## 2012-02-17 DIAGNOSIS — M7120 Synovial cyst of popliteal space [Baker], unspecified knee: Secondary | ICD-10-CM | POA: Diagnosis not present

## 2012-02-17 DIAGNOSIS — R609 Edema, unspecified: Secondary | ICD-10-CM

## 2012-02-24 DIAGNOSIS — R259 Unspecified abnormal involuntary movements: Secondary | ICD-10-CM | POA: Diagnosis not present

## 2012-02-24 DIAGNOSIS — R0602 Shortness of breath: Secondary | ICD-10-CM | POA: Diagnosis not present

## 2012-02-24 DIAGNOSIS — M7120 Synovial cyst of popliteal space [Baker], unspecified knee: Secondary | ICD-10-CM | POA: Diagnosis not present

## 2012-02-24 DIAGNOSIS — IMO0002 Reserved for concepts with insufficient information to code with codable children: Secondary | ICD-10-CM | POA: Diagnosis not present

## 2012-02-26 DIAGNOSIS — M25569 Pain in unspecified knee: Secondary | ICD-10-CM | POA: Diagnosis not present

## 2012-03-02 DIAGNOSIS — M7120 Synovial cyst of popliteal space [Baker], unspecified knee: Secondary | ICD-10-CM | POA: Diagnosis not present

## 2012-03-02 DIAGNOSIS — M25579 Pain in unspecified ankle and joints of unspecified foot: Secondary | ICD-10-CM | POA: Diagnosis not present

## 2012-03-02 DIAGNOSIS — M79609 Pain in unspecified limb: Secondary | ICD-10-CM | POA: Diagnosis not present

## 2012-03-08 DIAGNOSIS — M25579 Pain in unspecified ankle and joints of unspecified foot: Secondary | ICD-10-CM | POA: Diagnosis not present

## 2012-03-08 DIAGNOSIS — M7120 Synovial cyst of popliteal space [Baker], unspecified knee: Secondary | ICD-10-CM | POA: Diagnosis not present

## 2012-03-14 DIAGNOSIS — M7120 Synovial cyst of popliteal space [Baker], unspecified knee: Secondary | ICD-10-CM | POA: Diagnosis not present

## 2012-03-14 DIAGNOSIS — M25579 Pain in unspecified ankle and joints of unspecified foot: Secondary | ICD-10-CM | POA: Diagnosis not present

## 2012-03-15 DIAGNOSIS — M25579 Pain in unspecified ankle and joints of unspecified foot: Secondary | ICD-10-CM | POA: Diagnosis not present

## 2012-03-15 DIAGNOSIS — M7120 Synovial cyst of popliteal space [Baker], unspecified knee: Secondary | ICD-10-CM | POA: Diagnosis not present

## 2012-03-21 DIAGNOSIS — M7120 Synovial cyst of popliteal space [Baker], unspecified knee: Secondary | ICD-10-CM | POA: Diagnosis not present

## 2012-03-21 DIAGNOSIS — M25579 Pain in unspecified ankle and joints of unspecified foot: Secondary | ICD-10-CM | POA: Diagnosis not present

## 2012-03-23 DIAGNOSIS — M25579 Pain in unspecified ankle and joints of unspecified foot: Secondary | ICD-10-CM | POA: Diagnosis not present

## 2012-03-23 DIAGNOSIS — M7120 Synovial cyst of popliteal space [Baker], unspecified knee: Secondary | ICD-10-CM | POA: Diagnosis not present

## 2012-04-04 DIAGNOSIS — R259 Unspecified abnormal involuntary movements: Secondary | ICD-10-CM | POA: Diagnosis not present

## 2012-04-04 DIAGNOSIS — IMO0002 Reserved for concepts with insufficient information to code with codable children: Secondary | ICD-10-CM | POA: Diagnosis not present

## 2012-04-04 DIAGNOSIS — R0602 Shortness of breath: Secondary | ICD-10-CM | POA: Diagnosis not present

## 2012-04-04 DIAGNOSIS — M7120 Synovial cyst of popliteal space [Baker], unspecified knee: Secondary | ICD-10-CM | POA: Diagnosis not present

## 2012-04-14 DIAGNOSIS — M7120 Synovial cyst of popliteal space [Baker], unspecified knee: Secondary | ICD-10-CM | POA: Diagnosis not present

## 2012-04-19 DIAGNOSIS — Z8546 Personal history of malignant neoplasm of prostate: Secondary | ICD-10-CM | POA: Diagnosis not present

## 2012-04-26 DIAGNOSIS — Z8546 Personal history of malignant neoplasm of prostate: Secondary | ICD-10-CM | POA: Diagnosis not present

## 2012-05-04 DIAGNOSIS — H43819 Vitreous degeneration, unspecified eye: Secondary | ICD-10-CM | POA: Diagnosis not present

## 2012-05-04 DIAGNOSIS — H538 Other visual disturbances: Secondary | ICD-10-CM | POA: Diagnosis not present

## 2012-05-04 DIAGNOSIS — H31109 Choroidal degeneration, unspecified, unspecified eye: Secondary | ICD-10-CM | POA: Diagnosis not present

## 2012-05-04 DIAGNOSIS — H35369 Drusen (degenerative) of macula, unspecified eye: Secondary | ICD-10-CM | POA: Diagnosis not present

## 2012-08-08 DIAGNOSIS — K6289 Other specified diseases of anus and rectum: Secondary | ICD-10-CM | POA: Diagnosis not present

## 2012-08-08 DIAGNOSIS — Z0000 Encounter for general adult medical examination without abnormal findings: Secondary | ICD-10-CM | POA: Diagnosis not present

## 2012-08-08 DIAGNOSIS — E7800 Pure hypercholesterolemia, unspecified: Secondary | ICD-10-CM | POA: Diagnosis not present

## 2012-08-08 DIAGNOSIS — Z1331 Encounter for screening for depression: Secondary | ICD-10-CM | POA: Diagnosis not present

## 2012-08-08 DIAGNOSIS — Z131 Encounter for screening for diabetes mellitus: Secondary | ICD-10-CM | POA: Diagnosis not present

## 2012-08-08 DIAGNOSIS — R259 Unspecified abnormal involuntary movements: Secondary | ICD-10-CM | POA: Diagnosis not present

## 2013-04-25 DIAGNOSIS — Z8546 Personal history of malignant neoplasm of prostate: Secondary | ICD-10-CM | POA: Diagnosis not present

## 2013-04-26 DIAGNOSIS — R0602 Shortness of breath: Secondary | ICD-10-CM | POA: Diagnosis not present

## 2013-05-02 DIAGNOSIS — Z8546 Personal history of malignant neoplasm of prostate: Secondary | ICD-10-CM | POA: Diagnosis not present

## 2013-08-17 DIAGNOSIS — Z131 Encounter for screening for diabetes mellitus: Secondary | ICD-10-CM | POA: Diagnosis not present

## 2013-08-17 DIAGNOSIS — E7800 Pure hypercholesterolemia, unspecified: Secondary | ICD-10-CM | POA: Diagnosis not present

## 2013-08-17 DIAGNOSIS — R259 Unspecified abnormal involuntary movements: Secondary | ICD-10-CM | POA: Diagnosis not present

## 2013-08-17 DIAGNOSIS — R030 Elevated blood-pressure reading, without diagnosis of hypertension: Secondary | ICD-10-CM | POA: Diagnosis not present

## 2013-08-17 DIAGNOSIS — Z23 Encounter for immunization: Secondary | ICD-10-CM | POA: Diagnosis not present

## 2013-08-17 DIAGNOSIS — K6289 Other specified diseases of anus and rectum: Secondary | ICD-10-CM | POA: Diagnosis not present

## 2013-08-17 DIAGNOSIS — Z0000 Encounter for general adult medical examination without abnormal findings: Secondary | ICD-10-CM | POA: Diagnosis not present

## 2013-08-17 DIAGNOSIS — Z1331 Encounter for screening for depression: Secondary | ICD-10-CM | POA: Diagnosis not present

## 2013-08-24 DIAGNOSIS — E782 Mixed hyperlipidemia: Secondary | ICD-10-CM | POA: Diagnosis not present

## 2013-08-24 DIAGNOSIS — Z131 Encounter for screening for diabetes mellitus: Secondary | ICD-10-CM | POA: Diagnosis not present

## 2013-08-31 DIAGNOSIS — H6120 Impacted cerumen, unspecified ear: Secondary | ICD-10-CM | POA: Diagnosis not present

## 2013-08-31 DIAGNOSIS — H902 Conductive hearing loss, unspecified: Secondary | ICD-10-CM | POA: Diagnosis not present

## 2013-11-01 IMAGING — US US EXTREM LOW VENOUS*L*
1 series · 14 of 24 positions shown · non-contrast
Comparison: None

CLINICAL DATA: LEFT LEG PAIN AND SWELLING ? DVT;;

LEFT LOWER EXTREMITY VENOUS DUPLEX ULTRASOUND
TECHNIQUE: Gray-scale sonography with compression, as well as color
and duplex ultrasound, were performed to evaluate the deep venous
system from the level of the common femoral vein through the
popliteal and proximal calf veins.

[Series 1: us extrem low venous*left* · 14 of 47 slices shown]
[im 1/47]
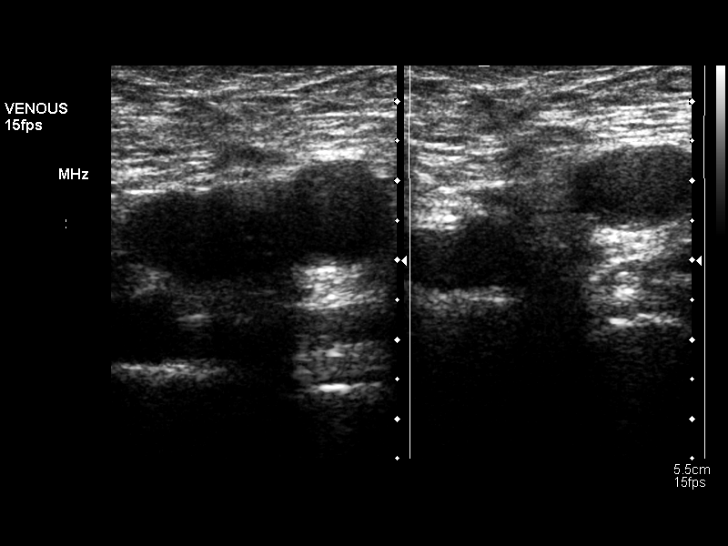
[im 5/47]
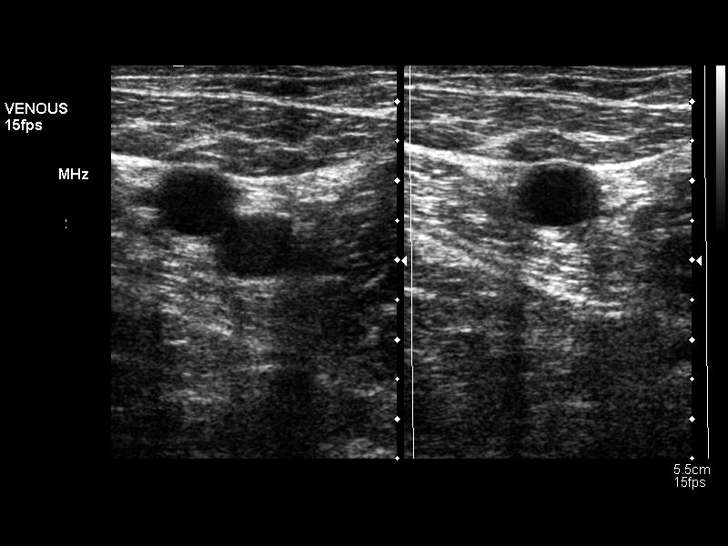
[im 9/47]
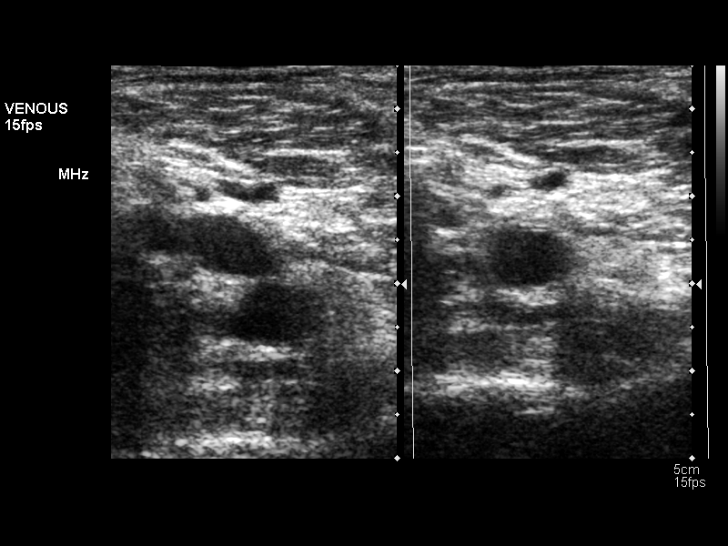
[im 13/47]
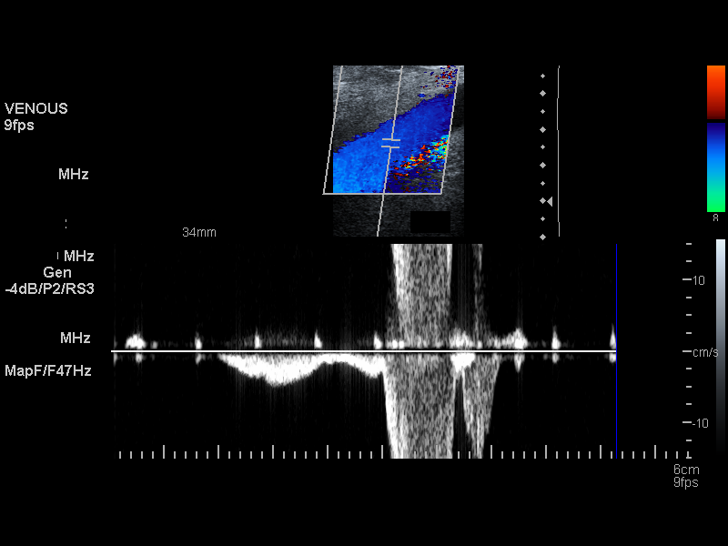
[im 15/47]
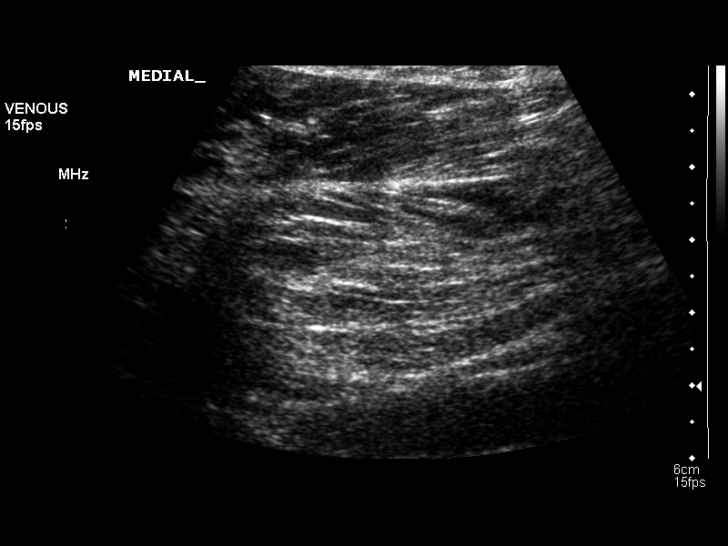
[im 19/47]
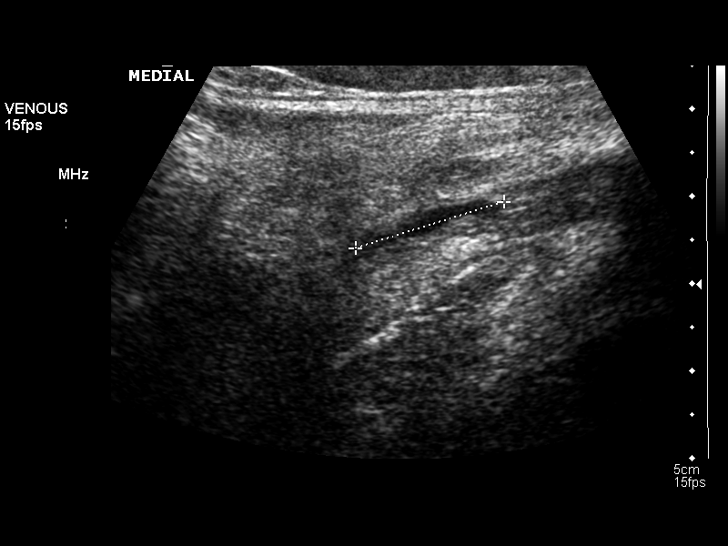
[im 23/47]
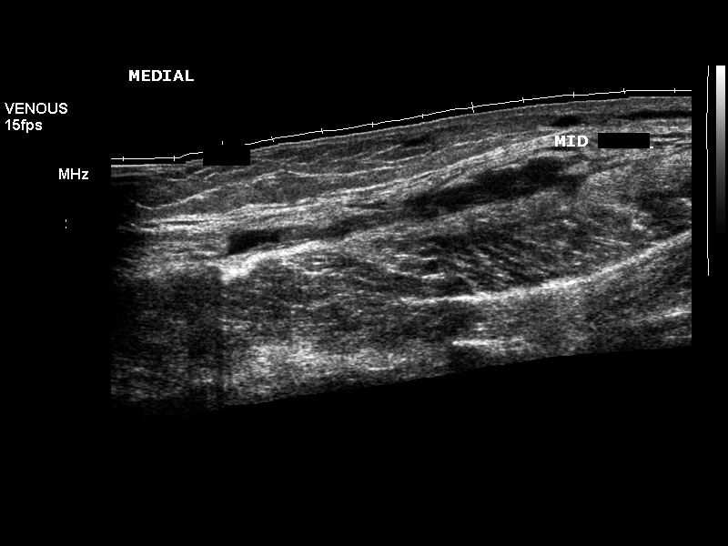
[im 25/47]
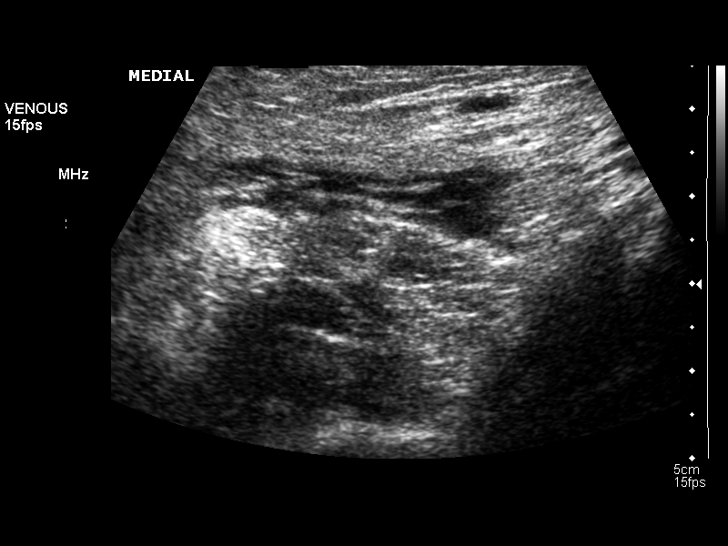
[im 29/47]
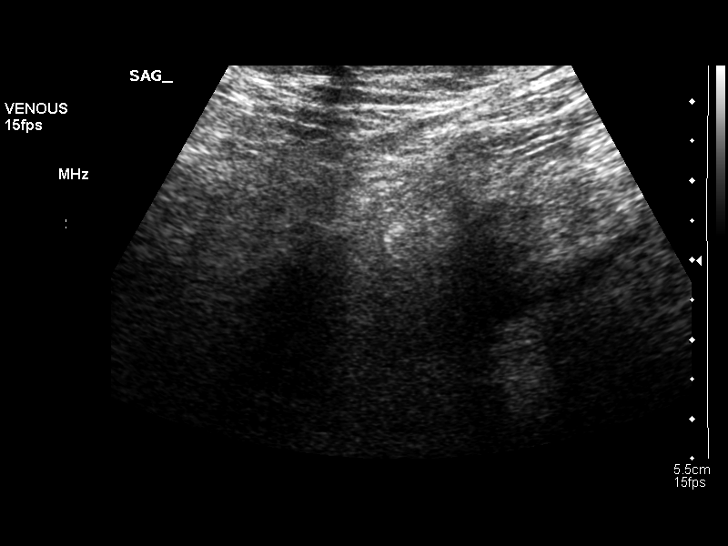
[im 33/47]
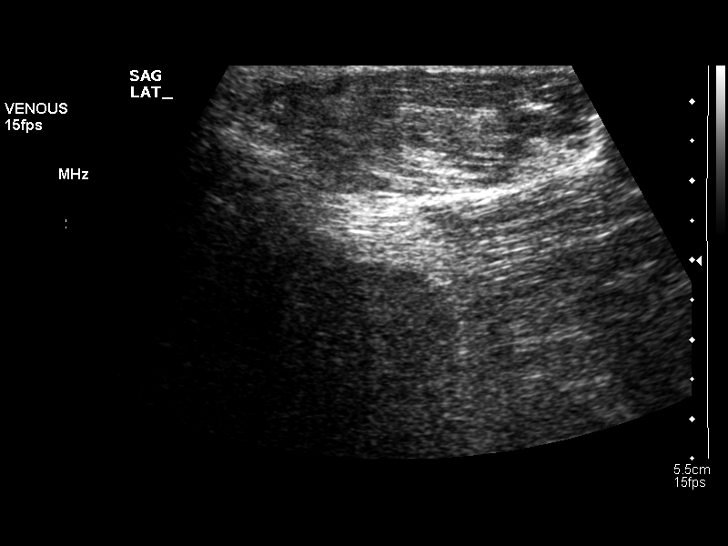
[im 37/47]
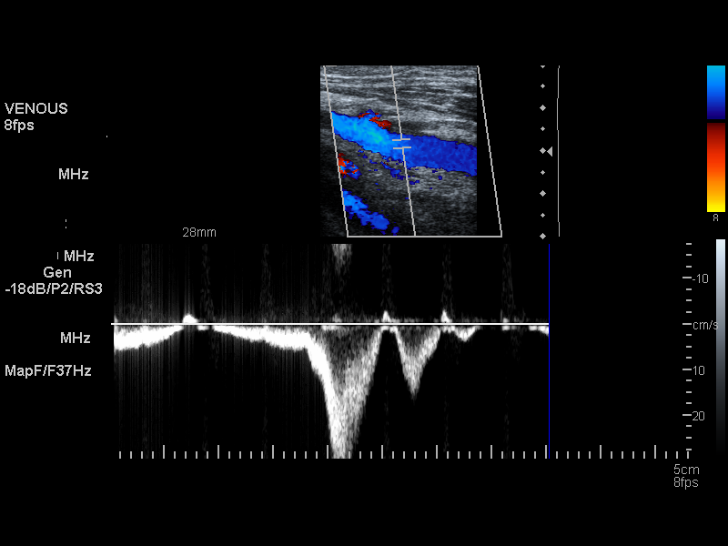
[im 39/47]
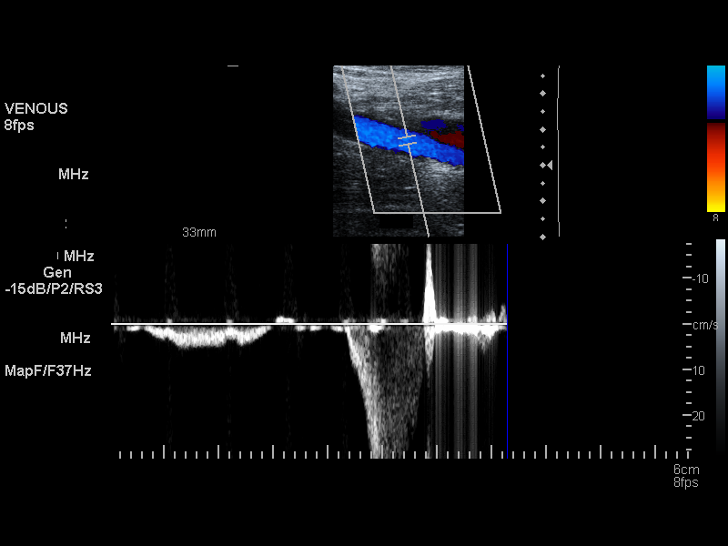
[im 43/47]
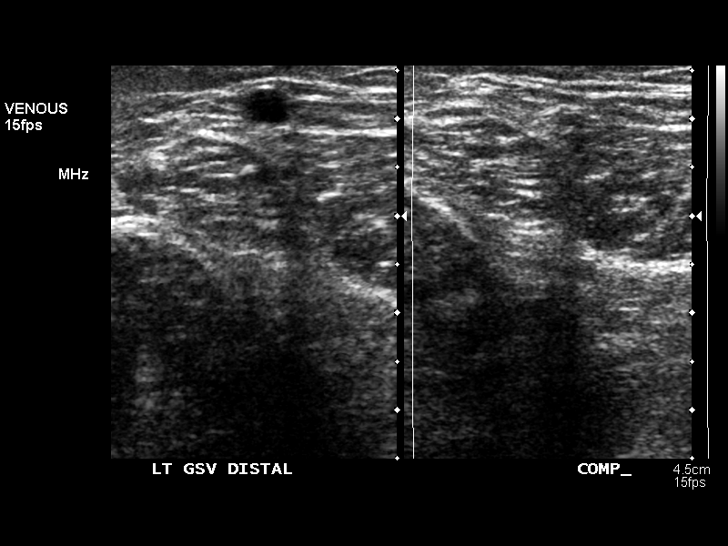
[im 47/47]
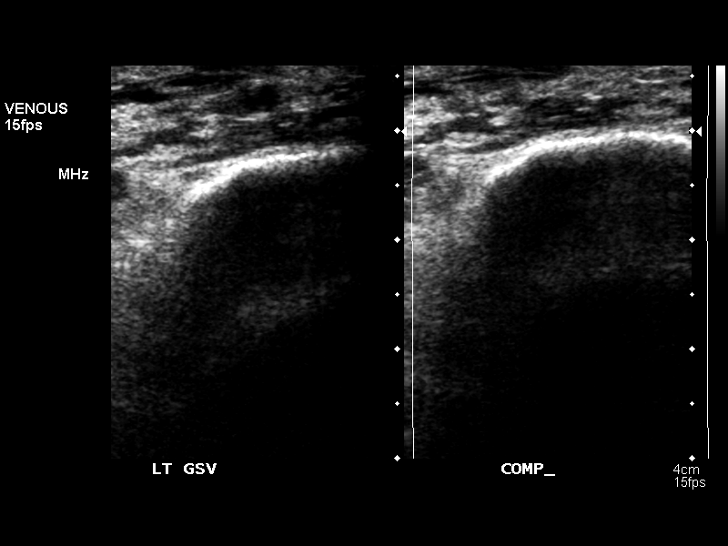

[14 of 24 positions shown; findings below may reference images not displayed]

FINDINGS: Normal compressibility and normal Doppler signal within
the common femoral, superficial femoral and popliteal veins, down
to the proximal calf veins.  No grayscale filling defects to
suggest DVT.

Complex fluid collection in the posterior medial knee region
measuring 7.0 x 4.1 x 0.7 cm compatible with complex Baker's cyst.
IMPRESSION: No evidence of left lower extremity deep vein thrombosis.

Baker's cyst.

## 2014-04-05 ENCOUNTER — Other Ambulatory Visit: Payer: Self-pay | Admitting: General Surgery

## 2014-04-05 DIAGNOSIS — I77819 Aortic ectasia, unspecified site: Secondary | ICD-10-CM

## 2014-04-06 ENCOUNTER — Ambulatory Visit (HOSPITAL_COMMUNITY): Payer: Medicare Other | Attending: Internal Medicine | Admitting: Radiology

## 2014-04-06 DIAGNOSIS — I091 Rheumatic diseases of endocardium, valve unspecified: Secondary | ICD-10-CM

## 2014-04-06 DIAGNOSIS — I77819 Aortic ectasia, unspecified site: Secondary | ICD-10-CM | POA: Insufficient documentation

## 2014-04-06 DIAGNOSIS — I059 Rheumatic mitral valve disease, unspecified: Secondary | ICD-10-CM | POA: Diagnosis not present

## 2014-04-06 DIAGNOSIS — I359 Nonrheumatic aortic valve disorder, unspecified: Secondary | ICD-10-CM | POA: Diagnosis not present

## 2014-04-06 NOTE — Progress Notes (Signed)
Echocardiogram performed.  

## 2014-04-09 ENCOUNTER — Other Ambulatory Visit: Payer: Self-pay | Admitting: General Surgery

## 2014-04-09 DIAGNOSIS — I77810 Thoracic aortic ectasia: Secondary | ICD-10-CM

## 2014-05-02 DIAGNOSIS — Z8546 Personal history of malignant neoplasm of prostate: Secondary | ICD-10-CM | POA: Diagnosis not present

## 2014-05-08 DIAGNOSIS — Z8546 Personal history of malignant neoplasm of prostate: Secondary | ICD-10-CM | POA: Diagnosis not present

## 2014-07-06 DIAGNOSIS — Z23 Encounter for immunization: Secondary | ICD-10-CM | POA: Diagnosis not present

## 2014-07-06 DIAGNOSIS — T148 Other injury of unspecified body region: Secondary | ICD-10-CM | POA: Diagnosis not present

## 2014-07-27 DIAGNOSIS — B354 Tinea corporis: Secondary | ICD-10-CM | POA: Diagnosis not present

## 2014-09-20 DIAGNOSIS — L259 Unspecified contact dermatitis, unspecified cause: Secondary | ICD-10-CM | POA: Diagnosis not present

## 2014-09-27 ENCOUNTER — Encounter (HOSPITAL_COMMUNITY): Payer: Self-pay | Admitting: Ophthalmology

## 2014-11-06 DIAGNOSIS — Z1389 Encounter for screening for other disorder: Secondary | ICD-10-CM | POA: Diagnosis not present

## 2014-11-06 DIAGNOSIS — C61 Malignant neoplasm of prostate: Secondary | ICD-10-CM | POA: Diagnosis not present

## 2014-11-06 DIAGNOSIS — Z0000 Encounter for general adult medical examination without abnormal findings: Secondary | ICD-10-CM | POA: Diagnosis not present

## 2014-11-06 DIAGNOSIS — E780 Pure hypercholesterolemia: Secondary | ICD-10-CM | POA: Diagnosis not present

## 2014-11-06 DIAGNOSIS — R413 Other amnesia: Secondary | ICD-10-CM | POA: Diagnosis not present

## 2014-11-06 DIAGNOSIS — G4700 Insomnia, unspecified: Secondary | ICD-10-CM | POA: Diagnosis not present

## 2014-11-28 ENCOUNTER — Ambulatory Visit (INDEPENDENT_AMBULATORY_CARE_PROVIDER_SITE_OTHER): Payer: Medicare Other | Admitting: Neurology

## 2014-11-28 ENCOUNTER — Encounter: Payer: Self-pay | Admitting: Neurology

## 2014-11-28 VITALS — BP 160/82 | HR 59 | Ht 62.0 in | Wt 162.2 lb

## 2014-11-28 DIAGNOSIS — R259 Unspecified abnormal involuntary movements: Secondary | ICD-10-CM | POA: Diagnosis not present

## 2014-11-28 DIAGNOSIS — E538 Deficiency of other specified B group vitamins: Secondary | ICD-10-CM

## 2014-11-28 DIAGNOSIS — R251 Tremor, unspecified: Secondary | ICD-10-CM | POA: Diagnosis not present

## 2014-11-28 DIAGNOSIS — R413 Other amnesia: Secondary | ICD-10-CM | POA: Diagnosis not present

## 2014-11-28 HISTORY — DX: Other amnesia: R41.3

## 2014-11-28 HISTORY — DX: Tremor, unspecified: R25.1

## 2014-11-28 NOTE — Patient Instructions (Signed)
   Begin Aricept (donepezil) at 5 mg at night for one month. If this medication is well-tolerated, please call our office and we will call in a prescription for the 10 mg tablets. Look out for side effects that may include nausea, diarrhea, weight loss, or stomach cramps. This medication will also cause a runny nose, therefore there is no need for allergy medications for this purpose.  Tremor Tremor is a rhythmic, involuntary muscular contraction characterized by oscillations (to-and-fro movements) of a part of the body. The most common of all involuntary movements, tremor can affect various body parts such as the hands, head, facial structures, vocal cords, trunk, and legs; most tremors, however, occur in the hands. Tremor often accompanies neurological disorders associated with aging. Although the disorder is not life-threatening, it can be responsible for functional disability and social embarrassment. TREATMENT  There are many types of tremor and several ways in which tremor is classified. The most common classification is by behavioral context or position. There are five categories of tremor within this classification: resting, postural, kinetic, task-specific, and psychogenic. Resting or static tremor occurs when the muscle is at rest, for example when the hands are lying on the lap. This type of tremor is often seen in patients with Parkinson's disease. Postural tremor occurs when a patient attempts to maintain posture, such as holding the hands outstretched. Postural tremors include physiological tremor, essential tremor, tremor with basal ganglia disease (also seen in patients with Parkinson's disease), cerebellar postural tremor, tremor with peripheral neuropathy, post-traumatic tremor, and alcoholic tremor. Kinetic or intention (action) tremor occurs during purposeful movement, for example during finger-to-nose testing. Task-specific tremor appears when performing goal-oriented tasks such as  handwriting, speaking, or standing. This group consists of primary writing tremor, vocal tremor, and orthostatic tremor. Psychogenic tremor occurs in both older and younger patients. The key feature of this tremor is that it dramatically lessens or disappears when the patient is distracted. PROGNOSIS There are some treatment options available for tremor; the appropriate treatment depends on accurate diagnosis of the cause. Some tremors respond to treatment of the underlying condition, for example in some cases of psychogenic tremor treating the patient's underlying mental problem may cause the tremor to disappear. Also, patients with tremor due to Parkinson's disease may be treated with Levodopa drug therapy. Symptomatic drug therapy is available for several other tremors as well. For those cases of tremor in which there is no effective drug treatment, physical measures such as teaching the patient to brace the affected limb during the tremor are sometimes useful. Surgical intervention such as thalamotomy or deep brain stimulation may be useful in certain cases. Document Released: 08/21/2002 Document Revised: 11/23/2011 Document Reviewed: 08/31/2005 [facility] Patient Information 2015 [place], [place]. This information is not intended to replace advice given to you by your health care provider. Make sure you discuss any questions you have with your health care provider.

## 2014-11-28 NOTE — Progress Notes (Signed)
Reason for visit: Memory disorder  Referring physician: Dr. Dorian Heckle  Todd Holmes is a 81 y.o. male  History of present illness:  Todd Holmes is an 81 year old right-handed black male with a history of problems with a memory disturbance over the last 2-3 years. He indicates that the memory has changed gradually over time, associated with difficulty with remembering names for people. He also has problems with memory of recent events, and he will misplace things about the house frequently. He is a bit unsure of himself when it comes to managing his finances, but he does this independently. He lives alone. 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 He denies any problems with getting lost. He is able to manage his medications and appointments fairly well. He has noted some problems with difficulty with focusing, he denies any drowsiness per se during the day. He indicates that he does not sleep long at night, he may wake up after only 4 or 5 hours of sleep, and stay awake for several hours before going back to sleep. He denies any issues controlling the bowels or the bladder. He has a history of prostate cancer, and following radiation therapy he has had frequent bowel movements. The patient is sent to this office for further evaluation of the memory disturbance.  Past Medical History  Diagnosis Date  . Arthritis     knees  . Keloid     keloid across chest, pt. unsure of how he encountered it  . Cancer     prostate, tx 2005- radiation   . Neuromuscular disorder     benign- tremor- occas., treated /w nadolol  . Memory disorder 11/28/2014  . Tremor 11/28/2014    Past Surgical History  Procedure Laterality Date  . Hernia repair      bilateral hernia repair, inguinal, 1980's    . Eye surgery Bilateral     L cataract removed & IOL  . Cataract extraction w/phaco  09/23/2011    Procedure:  CATARACT EXTRACTION PHACO AND INTRAOCULAR LENS PLACEMENT (IOC);  Surgeon: Marylynn Pearson, MD;  Location: [facility];  Service: Ophthalmology;  Laterality: Right;    Family History  Problem Relation Age of Onset  . Anesthesia problems Neg Hx   . Hypotension Neg Hx   . Malignant hyperthermia Neg Hx   . Pseudochol deficiency Neg Hx   . Cancer Mother   . Stroke Father   . Lymphoma Brother     Social history:  reports that he has never smoked. He has never used smokeless tobacco. He reports that he does not drink alcohol or use illicit drugs.  Medications:  Prior to Admission medications   Medication Sig Start Date End Date Taking? Authorizing Provider  aspirin EC 81 MG tablet Take 81 mg by mouth daily.      Historical Provider, MD  Carboxymethylcellulose Sodium (REFRESH OP) Apply 2-3 drops to eye every morning.      Historical Provider, MD  loratadine (CLARITIN) 10 MG tablet Take 10 mg by mouth daily as needed. For allergies    Historical Provider, MD  Multiple Vitamins-Minerals (MULTIVITAMINS THER. W/MINERALS) TABS Take 1 tablet by mouth daily.      Historical Provider, MD  nadolol (CORGARD) 80 MG tablet Take 80 mg by mouth daily.     Historical Provider, MD     No Known Allergies  ROS:  Out of a  complete 14 system review of symptoms, the patient complains only of the following symptoms, and all other reviewed systems are negative.  Not enough sleep Memory disturbance  Blood pressure 160/82, pulse 59, height 5\' 2"  (1.575 m), weight 162 lb 3.2 oz (73.573 kg).  Physical Exam  General: The patient is alert and cooperative at the time of the examination. The patient is minimally obese.  Eyes: Pupils are equal, round, and reactive to light. Discs are flat bilaterally.  Neck: The neck is supple, no carotid bruits are noted.  Respiratory: The respiratory examination is clear.  Cardiovascular: The cardiovascular examination reveals a regular rate and rhythm, no obvious murmurs or rubs  are noted.  Skin: Extremities are without significant edema.  Neurologic Exam  Mental status: The patient is alert and oriented x 3 at the time of the examination. The patient has apparent normal recent and remote memory, with an apparently normal attention span and concentration ability. Mini-Mental Status Examination done today shows a total score of 24/30.  Cranial nerves: Facial symmetry is present. There is good sensation of the face to pinprick and soft touch bilaterally. The strength of the facial muscles and the muscles to head turning and shoulder shrug are normal bilaterally. Speech is well enunciated, no aphasia or dysarthria is noted. Extraocular movements are full. Visual fields are full. The tongue is midline, and the patient has symmetric elevation of the soft palate. No obvious hearing deficits are noted.  Motor: The motor testing reveals 5 over 5 strength of all 4 extremities. Good symmetric motor tone is noted throughout.  Sensory: Sensory testing is intact to pinprick, soft touch, vibration sensation, and position sense on all 4 extremities. No evidence of extinction is noted.  Coordination: Cerebellar testing reveals good finger-nose-finger and heel-to-shin bilaterally.  Gait and station: Gait is normal. Tandem gait is minimally unsteady. Romberg is negative. No drift is seen.  Reflexes: Deep tendon reflexes are symmetric and normal bilaterally. Toes are downgoing bilaterally.   Assessment/Plan:  1. Memory disturbance  The patient has had a mild change in memory over the last 2 or 3 years. He is living alone, functioning independently currently. The patient will be set up for blood work today, he will have MRI evaluation of the brain. I discussed the possibility of starting Aricept. The patient will follow-up in about 6 months. The patient will decide about the medication in the upcoming weeks.  Jill Alexanders MD 11/28/2014 8:37 PM  [facility] [address] [place], [place] [address]  Phone [phone] Fax [phone]

## 2014-11-29 ENCOUNTER — Telehealth: Payer: Self-pay | Admitting: Neurology

## 2014-11-29 MED ORDER — DONEPEZIL HCL 5 MG PO TABS: 5.0000 mg | ORAL_TABLET | Freq: Every day | ORAL | Status: DC

## 2014-11-29 NOTE — Telephone Encounter (Signed)
I will call in a prescription for donepezil.

## 2014-11-29 NOTE — Telephone Encounter (Signed)
Spoke to patient and he relayed that he would like to start the Donepezil at 5 mg and have it sent to [facility].

## 2014-11-30 LAB — RPR: RPR Ser Ql: NONREACTIVE

## 2014-11-30 LAB — COPPER, SERUM: Copper: 102 ug/dL (ref 72–166)

## 2014-11-30 LAB — METHYLMALONIC ACID, SERUM: Methylmalonic Acid: 187 nmol/L (ref 0–378)

## 2014-11-30 LAB — VITAMIN B12: VITAMIN B 12: 778 pg/mL (ref 211–946)

## 2014-12-10 ENCOUNTER — Telehealth: Payer: Self-pay | Admitting: Neurology

## 2014-12-10 NOTE — Telephone Encounter (Signed)
I called the patient. I see no interactions between Aricept and trazodone, the Corgard can slow down the heart rate, as can Aricept, these 2 medications may therefore have an interaction. The Corgard is taking in the morning, the Aricept will be taken at night. I recommended that the patient not start 2 new medications at the same time, he will start the trazodone first, get acclimated to the medication after 2 weeks, and then begin the Aricept. If he gets dizzy or fainting, he is to stop the Aricept.

## 2014-12-10 NOTE — Telephone Encounter (Signed)
Patient stated PCP prescribed Trazadon 50 mg for sleep aid.  Went to pick up Rx donepezil (ARICEPT) 5 MG tablet and pharmacy advised not to take medications together due to adverse reaction.  Patient also states he takes Rx nadolol (CORGARD) 80 MG tablet in am and questioning if he could take this medication along with either of the above meds or if he could use alternate medication.  Please call and advise.

## 2014-12-20 ENCOUNTER — Ambulatory Visit
Admission: RE | Admit: 2014-12-20 | Discharge: 2014-12-20 | Disposition: A | Discharge status: Home / Self Care | Payer: Medicare Other | Source: Ambulatory Visit | Attending: Neurology | Admitting: Neurology

## 2014-12-20 DIAGNOSIS — R251 Tremor, unspecified: Secondary | ICD-10-CM | POA: Diagnosis not present

## 2014-12-20 DIAGNOSIS — R413 Other amnesia: Secondary | ICD-10-CM

## 2014-12-23 ENCOUNTER — Telehealth: Payer: Self-pay | Admitting: Neurology

## 2014-12-23 NOTE — Telephone Encounter (Signed)
  I called the patient. The MRI of the brain shows minimal SVD. This of no clinical concern. The patient is on aspirin.  MRI brain results 12/22/14:  IMPRESSION: Abnormal MRI scan of the brain showing mild changes of chronic microvascular ischemia and generalized cerebral atrophy. Incidental finding of cavum septum to visit him vergae and mild paranasal sinusitis

## 2015-04-21 DIAGNOSIS — S46919A Strain of unspecified muscle, fascia and tendon at shoulder and upper arm level, unspecified arm, initial encounter: Secondary | ICD-10-CM | POA: Diagnosis not present

## 2015-04-30 DIAGNOSIS — C61 Malignant neoplasm of prostate: Secondary | ICD-10-CM | POA: Diagnosis not present

## 2015-05-07 DIAGNOSIS — C61 Malignant neoplasm of prostate: Secondary | ICD-10-CM | POA: Diagnosis not present

## 2015-05-12 ENCOUNTER — Encounter (HOSPITAL_COMMUNITY): Payer: Self-pay | Admitting: Emergency Medicine

## 2015-05-12 ENCOUNTER — Emergency Department (INDEPENDENT_AMBULATORY_CARE_PROVIDER_SITE_OTHER)
Admission: EM | Admit: 2015-05-12 | Discharge: 2015-05-12 | Disposition: A | Discharge status: Home / Self Care | Payer: Medicare Other | Source: Home / Self Care | Attending: Family Medicine | Admitting: Family Medicine

## 2015-05-12 DIAGNOSIS — N4889 Other specified disorders of penis: Secondary | ICD-10-CM

## 2015-05-12 DIAGNOSIS — N478 Other disorders of prepuce: Secondary | ICD-10-CM

## 2015-05-12 MED ORDER — MUPIROCIN 2 % EX OINT: TOPICAL_OINTMENT | CUTANEOUS | Status: DC

## 2015-05-12 MED ORDER — CEPHALEXIN 500 MG PO CAPS: 500.0000 mg | ORAL_CAPSULE | Freq: Three times a day (TID) | ORAL | Status: DC

## 2015-05-12 NOTE — ED Notes (Addendum)
Penile issue, patient reports this is balanitis

## 2015-05-12 NOTE — ED Provider Notes (Signed)
CSN: [id number]     Arrival date & time 05/12/15  1423 History   First MD Initiated Contact with Patient 05/12/15 1556     Chief Complaint  Patient presents with  . Rash   (Consider location/radiation/quality/duration/timing/severity/associated sxs/prior Treatment) Patient is a 81 y.o. male presenting with rash. The history is provided by the patient.  Rash Location:  Ano-genital Ano-genital rash location:  Penis Quality: draining, painful and redness   Pain details:    Severity:  Mild   Onset quality:  Gradual   Duration:  1 week   Progression:  Unchanged Severity:  Mild Onset quality:  Gradual Progression:  Unchanged Chronicity:  New Relieved by:  None tried Worsened by:  Nothing tried Ineffective treatments:  None tried   Past Medical History  Diagnosis Date  . Arthritis     knees  . Keloid     keloid across chest, pt. unsure of how he encountered it  . Cancer     prostate, tx 2005- radiation   . Neuromuscular disorder     benign- tremor- occas., treated /w nadolol  . Memory disorder 11/28/2014  . Tremor 11/28/2014   Past Surgical History  Procedure Laterality Date  . Hernia repair      bilateral hernia repair, inguinal, 1980's    . Eye surgery Bilateral     L cataract removed & IOL  . Cataract extraction w/phaco  09/23/2011    Procedure: CATARACT EXTRACTION PHACO AND INTRAOCULAR LENS PLACEMENT (IOC);  Surgeon: Marylynn Pearson, MD;  Location: [facility];  Service: Ophthalmology;  Laterality: Right;   Family History  Problem Relation Age of Onset  . Anesthesia problems Neg Hx   . Hypotension Neg Hx   . Malignant hyperthermia Neg Hx   . Pseudochol deficiency Neg Hx   . Cancer Mother   . Stroke Father   . Lymphoma Brother    Social History  Substance Use Topics  . Smoking status: Never Smoker   . Smokeless tobacco: Never Used  . Alcohol Use: No    Review of Systems  Constitutional: Negative.   Genitourinary: Positive for penile pain. Negative for discharge,  penile swelling, scrotal swelling and testicular pain.  Skin: Positive for rash.    Allergies  Review of patient's allergies indicates no known allergies.  Home Medications   Prior to Admission medications   Medication Sig Start Date End Date Taking? Authorizing Provider  aspirin EC 81 MG tablet Take 81 mg by mouth daily.      Historical Provider, MD  Carboxymethylcellulose Sodium (REFRESH OP) Apply 2-3 drops to eye every morning.      Historical Provider, MD  cephALEXin (KEFLEX) 500 MG capsule Take 1 capsule (500 mg total) by mouth 3 (three) times daily. Take all of medicine and drink lots of fluids 05/12/15   Billy Fischer, MD  donepezil (ARICEPT) 5 MG tablet Take 1 tablet (5 mg total) by mouth at bedtime. 11/29/14   Kathrynn Ducking, MD  loratadine (CLARITIN) 10 MG tablet Take 10 mg by mouth daily as needed. For allergies    Historical Provider, MD  Multiple Vitamins-Minerals (MULTIVITAMINS THER. W/MINERALS) TABS Take 1 tablet by mouth daily.      Historical Provider, MD  mupirocin ointment (BACTROBAN) 2 % Apply to skin bid 05/12/15   Billy Fischer, MD  nadolol (CORGARD) 80 MG tablet Take 80 mg by mouth daily.     Historical Provider, MD  traZODone (DESYREL) 50 MG tablet Take 50 mg by mouth  at bedtime as needed. 11/07/14   Historical Provider, MD   Meds Ordered and Administered this Visit  Medications - No data to display  BP 161/80 mmHg  Pulse 70  Temp(Src) 98 F (36.7 C)  Resp 16  SpO2 100% No data found.   Physical Exam  Constitutional: He is oriented to person, place, and time. He appears well-developed and well-nourished.  Genitourinary: Testes normal.    Right testis shows no tenderness. Left testis shows no tenderness. Uncircumcised. Penile erythema and penile tenderness present. No phimosis.  Lymphadenopathy:       Right: No inguinal adenopathy present.       Left: No inguinal adenopathy present.  Neurological: He is alert and oriented to person, place, and time.    Skin: Skin is warm and dry.  Nursing note and vitals reviewed.   ED Course  Procedures (including critical care time)  Labs Review Labs Reviewed - No data to display  Imaging Review No results found.   Visual Acuity Review  Right Eye Distance:   Left Eye Distance:   Bilateral Distance:    Right Eye Near:   Left Eye Near:    Bilateral Near:         MDM   1. Foreskin problem        Billy Fischer, MD 05/12/15 1640

## 2015-05-12 NOTE — Discharge Instructions (Signed)
Wash, dry and apply ointment, take all of antibiotic, see your doctor this week for recheck.

## 2015-05-14 DIAGNOSIS — N489 Disorder of penis, unspecified: Secondary | ICD-10-CM | POA: Diagnosis not present

## 2015-05-30 ENCOUNTER — Ambulatory Visit: Payer: Self-pay | Admitting: Neurology

## 2015-05-30 ENCOUNTER — Ambulatory Visit (INDEPENDENT_AMBULATORY_CARE_PROVIDER_SITE_OTHER): Payer: Medicare Other | Admitting: Adult Health

## 2015-05-30 ENCOUNTER — Encounter: Payer: Self-pay | Admitting: Adult Health

## 2015-05-30 VITALS — BP 172/80 | HR 54 | Resp 20 | Ht 62.0 in | Wt 163.0 lb

## 2015-05-30 DIAGNOSIS — R413 Other amnesia: Secondary | ICD-10-CM | POA: Diagnosis not present

## 2015-05-30 NOTE — Patient Instructions (Addendum)
We will continue to monitor your memory. Will consider medication if your memory declines.  If your symptoms worsen or you develop new symptoms please let us know.

## 2015-05-30 NOTE — Progress Notes (Signed)
PATIENT: Todd Holmes DOB: [birth year]  REASON FOR VISIT: follow up- memory  HISTORY FROM: patient  HISTORY OF PRESENT ILLNESS: Todd Holmes is an 81 year old male with a history of memory disturbance. He returns today for follow-up. He was started on Aricept 5 mg daily however he reports that he did not start this because at the same time he began trazodone. Denies any significant changes in his memory. He states that he notices he has the most trouble with remembering names. He is able to complete all ADLs independently. He operates a Teacher, music without difficulty. Although he does report that he tries to stay away from busy traffic areas. Denies having to give up anything due to his memory. He returns today for an evaluation.   HISTORY  11/28/14: Todd Holmes is an 81 year old right-handed black male with a history of problems with a memory disturbance over the last 2-3 years. He indicates that the memory has changed gradually over time, associated with difficulty with remembering names for people. He also has problems with memory of recent events, and he will misplace things about the house frequently. He is a bit unsure of himself when it comes to managing his finances, but he does this independently. He lives alone. 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 He denies any problems with getting lost. He is able to manage his medications and appointments fairly well. He has noted some problems with difficulty with focusing, he denies any drowsiness per se during the day. He indicates that he does not sleep long at night, he may wake up after only 4 or 5 hours of sleep, and stay awake for several hours before going back to sleep. He denies any issues controlling the bowels or the bladder. He has a history of prostate cancer, and following radiation therapy he has had frequent bowel movements. The patient  is sent to this office for further evaluation of the memory disturbance.   REVIEW OF SYSTEMS: Out of a complete 14 system review of symptoms, the patient complains only of the following symptoms, and all other reviewed systems are negative.  ALLERGIES: No Known Allergies  HOME MEDICATIONS: Outpatient Prescriptions Prior to Visit  Medication Sig Dispense Refill  . aspirin EC 81 MG tablet Take 81 mg by mouth daily.      . Carboxymethylcellulose Sodium (REFRESH OP) Apply 2-3 drops to eye every morning.      . cephALEXin (KEFLEX) 500 MG capsule Take 1 capsule (500 mg total) by mouth 3 (three) times daily. Take all of medicine and drink lots of fluids 21 capsule 0  . donepezil (ARICEPT) 5 MG tablet Take 1 tablet (5 mg total) by mouth at bedtime. 30 tablet 1  . loratadine (CLARITIN) 10 MG tablet Take 10 mg by mouth daily as needed. For allergies    . Multiple Vitamins-Minerals (MULTIVITAMINS THER. W/MINERALS) TABS Take 1 tablet by mouth daily.      . mupirocin ointment (BACTROBAN) 2 % Apply to skin bid 22 g 0  . nadolol (CORGARD) 80 MG tablet Take 80 mg by mouth daily.     . traZODone (DESYREL) 50 MG tablet Take 50 mg by mouth at bedtime as needed.     No facility-administered medications prior to visit.    PAST MEDICAL HISTORY: Past Medical History  Diagnosis Date  . Arthritis     knees  . Keloid  keloid across chest, pt. unsure of how he encountered it  . Cancer     prostate, tx 2005- radiation   . Neuromuscular disorder     benign- tremor- occas., treated /w nadolol  . Memory disorder 11/28/2014  . Tremor 11/28/2014     FAMILY HISTORY: Family History  Problem Relation Age of Onset  . Anesthesia problems Neg Hx   . Hypotension Neg Hx   . Malignant hyperthermia Neg Hx   . Pseudochol deficiency Neg Hx   . Cancer Mother   . Stroke Father   . Lymphoma Brother     SOCIAL HISTORY: Social History   Social History  . Marital Status: Single    Spouse Name: N/A  .  Number of Children: 0  . Years of Education: 14   Occupational History  . retired    Social History Main Topics  . Smoking status: Never Smoker   . Smokeless tobacco: Never Used  . Alcohol Use: No  . Drug Use: No  . Sexual Activity: Not on file   Other Topics Concern  . Not on file   Social History Narrative   Patient is right handed.   Patient does not drink caffeine.    PHYSICAL EXAM  Filed Vitals:   05/30/15 1545  BP: 172/80  Pulse: 54  Resp: 20  Height: 5\' 2"  (1.575 m)  Weight: 163 lb (73.936 kg)   Body mass index is 29.81 kg/(m^2).  MMSE - Mini Mental State Exam 05/30/2015 11/28/2014  Orientation to time 5 5  Orientation to Place 5 5  Registration 3 3  Attention/ Calculation 5 1  Recall 3 2  Language- name 2 objects 2 2  Language- repeat 1 1  Language- follow 3 step command 1 3  Language- read & follow direction 1 1  Write a sentence 1 0  Copy design 1 1  Total score 28 24       Generalized: Well developed, in no acute distress   Neurological examination  Mentation: Alert oriented to time, place, history taking. Follows all commands speech and language fluent Cranial nerve II-XII: Pupils were equal round reactive to light. Extraocular movements were full, visual field were full on confrontational test. Facial sensation and strength were normal. Uvula tongue midline. Head turning and shoulder shrug  were normal and symmetric. Motor: The motor testing reveals 5 over 5 strength of all 4 extremities. Good symmetric motor tone is noted throughout.  Sensory: Sensory testing is intact to soft touch on all 4 extremities. No evidence of extinction is noted.  Coordination: Cerebellar testing reveals good finger-nose-finger and heel-to-shin bilaterally.  Gait and station: Gait is normal. Tandem gait is normal. Romberg is negative. No drift is seen.  Reflexes: Deep tendon reflexes are symmetric and normal bilaterally.   DIAGNOSTIC DATA (LABS, IMAGING, TESTING) - I  reviewed patient records, labs, notes, testing and imaging myself where available.   ASSESSMENT AND PLAN 81 y.o. year old male  has a past medical history of Arthritis; Keloid; Cancer; Neuromuscular disorder; Memory disorder (11/28/2014); and Tremor (11/28/2014). here with:  1. Memory disturbance   The patient's memory score has improved. His MMSE today is 28/30 was previously 24/30. The patient did not start Aricept due to starting trazodone at the same time. At this time I would hold off on starting Aricept as his memory score has improved. We will continue to monitor his memory and perhaps start Aricept in the future if needed. Patient advised that if his symptoms  worsen or he develops any new symptoms he should let us know. He will follow-up in 3-4 months or sooner if needed.  I spent 25 minutes with the patient 50% of this time was spent counseling the patient on medication and diagnosis.  Ward Givens, MSN, NP-C 05/30/2015, 3:44 PM [facility] [address], [place], [place] [address] [phone]

## 2015-06-03 ENCOUNTER — Ambulatory Visit: Payer: Medicare Other | Admitting: Neurology

## 2015-08-21 DIAGNOSIS — H0289 Other specified disorders of eyelid: Secondary | ICD-10-CM | POA: Diagnosis not present

## 2015-08-21 DIAGNOSIS — H47323 Drusen of optic disc, bilateral: Secondary | ICD-10-CM | POA: Diagnosis not present

## 2015-08-26 DIAGNOSIS — Z23 Encounter for immunization: Secondary | ICD-10-CM | POA: Diagnosis not present

## 2015-08-29 ENCOUNTER — Encounter (INDEPENDENT_AMBULATORY_CARE_PROVIDER_SITE_OTHER): Payer: Self-pay

## 2015-08-29 ENCOUNTER — Ambulatory Visit (INDEPENDENT_AMBULATORY_CARE_PROVIDER_SITE_OTHER): Payer: Medicare Other | Admitting: Adult Health

## 2015-08-29 ENCOUNTER — Encounter: Payer: Self-pay | Admitting: Adult Health

## 2015-08-29 VITALS — BP 176/81 | HR 58 | Ht 62.0 in | Wt 163.5 lb

## 2015-08-29 DIAGNOSIS — G4700 Insomnia, unspecified: Secondary | ICD-10-CM

## 2015-08-29 DIAGNOSIS — R413 Other amnesia: Secondary | ICD-10-CM | POA: Diagnosis not present

## 2015-08-29 NOTE — Progress Notes (Signed)
I have read the note, and I agree with the clinical assessment and plan.  WILLIS,CHARLES KEITH   

## 2015-08-29 NOTE — Progress Notes (Signed)
PATIENT: Todd Holmes DOB: 1929/07/30  REASON FOR VISIT: follow up- memory  HISTORY FROM: patient  HISTORY OF PRESENT ILLNESS: Todd Holmes is a 81 year old male with a history of memory disturbance. He returns today for follow-up. The patient is currently not on any memory medication. He reports that his memory has remained stable. He is able to complete all ADLs independently. He operates a Teacher, music without difficulty. Denies getting lost or receiving any traffic violations. He states that he does not drive on highways anymore. The patient is able to complete all of his finances without difficulty. Although sometimes he wonders if he's keeping up with everything. He does have a Music therapist that helps him. He is able to prepare his own meals. He states that occasionally he will start cooking and doing the next room and watch TV and forget about his meal. He states this happens very seldom. The patient states he continues to have trouble with his sleep. Some nights he will only get a couple hours of sleep. The patient was taking trazodone but he stopped because he was unsure if he should take trazodone or the Aricept. He denies any new neurological symptoms. He returns today for an evaluation.  HISTORY 05/30/15: Todd Holmes is an 81 year old male with a history of memory disturbance. He returns today for follow-up. He was started on Aricept 5 mg daily however he reports that he did not start this because at the same time he began trazodone. Denies any significant changes in his memory. He states that he notices he has the most trouble with remembering names. He is able to complete all ADLs independently. He operates a Teacher, music without difficulty. Although he does report that he tries to stay away from busy traffic areas. Denies having to give up anything due to his memory. He returns today for an evaluation.   HISTORY  11/28/14: Todd Holmes is an 81 year old right-handed black male  with a history of problems with a memory disturbance over the last 2-3 years. He indicates that the memory has changed gradually over time, associated with difficulty with remembering names for people. He also has problems with memory of recent events, and he will misplace things about the house frequently. He is a bit unsure of himself when it comes to managing his finances, but he does this independently. He lives alone. 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 He denies any problems with getting lost. He is able to manage his medications and appointments fairly well. He has noted some problems with difficulty with focusing, he denies any drowsiness per se during the day. He indicates that he does not sleep long at night, he may wake up after only 4 or 5 hours of sleep, and stay awake for several hours before going back to sleep. He denies any issues controlling the bowels or the bladder. He has a history of prostate cancer, and following radiation therapy he has had frequent bowel movements. The patient is sent to this office for further evaluation of the memory disturbance.  REVIEW OF SYSTEMS: Out of a complete 14 system review of symptoms, the patient complains only of the following symptoms, and all other reviewed systems are negative.  Frequency of urination, insomnia, memory loss ALLERGIES: No Known Allergies  HOME MEDICATIONS: Outpatient Prescriptions Prior to Visit  Medication Sig Dispense Refill  . aspirin EC 81 MG tablet  Take 81 mg by mouth daily.      . Carboxymethylcellulose Sodium (REFRESH OP) Apply 2-3 drops to eye daily as needed.     . cephALEXin (KEFLEX) 500 MG capsule Take 1 capsule (500 mg total) by mouth 3 (three) times daily. Take all of medicine and drink lots of fluids 21 capsule 0  . donepezil (ARICEPT) 5 MG tablet Take 1 tablet (5 mg total) by mouth at bedtime. 30 tablet 1  .  loratadine (CLARITIN) 10 MG tablet Take 10 mg by mouth daily as needed. For allergies    . Multiple Vitamins-Minerals (MULTIVITAMINS THER. W/MINERALS) TABS Take 1 tablet by mouth daily.      . mupirocin ointment (BACTROBAN) 2 % Apply to skin bid 22 g 0  . nadolol (CORGARD) 80 MG tablet Take 80 mg by mouth daily.     . traZODone (DESYREL) 50 MG tablet Take 50 mg by mouth at bedtime as needed.     No facility-administered medications prior to visit.    PAST MEDICAL HISTORY: Past Medical History  Diagnosis Date  . Arthritis     knees  . Keloid     keloid across chest, pt. unsure of how he encountered it  . Cancer     prostate, tx 2005- radiation   . Neuromuscular disorder     benign- tremor- occas., treated /w nadolol  . Memory disorder 11/28/2014  . Tremor 11/28/2014    PAST SURGICAL HISTORY: Past Surgical History  Procedure Laterality Date  . Hernia repair      bilateral hernia repair, inguinal, 1980's    . Eye surgery Bilateral     L cataract removed & IOL  . Cataract extraction w/phaco  09/23/2011    Procedure: CATARACT EXTRACTION PHACO AND INTRAOCULAR LENS PLACEMENT (IOC);  Surgeon: Marylynn Pearson, MD;  Location: [facility];  Service: Ophthalmology;  Laterality: Right;    FAMILY HISTORY: Family History  Problem Relation Age of Onset  . Anesthesia problems Neg Hx   . Hypotension Neg Hx   . Malignant hyperthermia Neg Hx   . Pseudochol deficiency Neg Hx   . Cancer Mother   . Stroke Father   . Lymphoma Brother     SOCIAL HISTORY: Social History   Social History  . Marital Status: Single    Spouse Name: N/A  . Number of Children: 0  . Years of Education: 14   Occupational History  . retired    Social History Main Topics  . Smoking status: Never Smoker   . Smokeless tobacco: Never Used  . Alcohol Use: No  . Drug Use: No  . Sexual Activity: Not on file   Other Topics Concern  . Not on file   Social History Narrative   Patient is right handed.   Patient does not  drink caffeine.      PHYSICAL EXAM  Filed Vitals:   08/29/15 1505  BP: 176/81  Pulse: 58  Height: 5\' 2"  (1.575 m)  Weight: 163 lb 8 oz (74.163 kg)   Body mass index is 29.9 kg/(m^2).  Generalized: Well developed, in no acute distress   Neurological examination  Mentation: Alert oriented to time, place, history taking. Follows all commands speech and language fluent Cranial nerve II-XII: Pupils were equal round reactive to light. Extraocular movements were full, visual field were full on confrontational test. Facial sensation and strength were normal. Uvula tongue midline. Head turning and shoulder shrug  were normal and symmetric. Motor: The motor testing reveals 5 over  5 strength of all 4 extremities. Good symmetric motor tone is noted throughout.  Sensory: Sensory testing is intact to soft touch on all 4 extremities. No evidence of extinction is noted.  Coordination: Cerebellar testing reveals good finger-nose-finger and heel-to-shin bilaterally.  Gait and station: Gait is normal. Tandem gait is slightly unsteady. Romberg is negative. No drift is seen.  Reflexes: Deep tendon reflexes are symmetric and normal bilaterally.   DIAGNOSTIC DATA (LABS, IMAGING, TESTING) - I reviewed patient records, labs, notes, testing and imaging myself where available.    ASSESSMENT AND PLAN 81 y.o. year old male  has a past medical history of Arthritis; Keloid; Cancer; Neuromuscular disorder; Memory disorder (11/28/2014); and Tremor (11/28/2014). here with:  1. Memory loss 2. Insomnia  Overall the patient is doing well. His memory score has remained stable. His MMSE today is 29/30. At this point I do not think we need to restart the Aricept. Patient is amenable to this plan. The patient however continues to have difficulty with his sleep. I advised the patient that he should restart the trazodone. This was prescribed by his primary care provider. Patient verbalized understanding. He will follow-up  in 6 months or sooner if needed.     Ward Givens, MSN, NP-C 08/29/2015, 3:13 PM [facility] [address], [place], [place] [address] [phone]

## 2015-08-29 NOTE — Patient Instructions (Signed)
Memory score is stable Restart trazodone If your symptoms worsen or you develop new symptoms please let us know.

## 2015-11-11 DIAGNOSIS — G250 Essential tremor: Secondary | ICD-10-CM | POA: Diagnosis not present

## 2015-11-11 DIAGNOSIS — G4700 Insomnia, unspecified: Secondary | ICD-10-CM | POA: Diagnosis not present

## 2015-11-11 DIAGNOSIS — Z0000 Encounter for general adult medical examination without abnormal findings: Secondary | ICD-10-CM | POA: Diagnosis not present

## 2015-11-11 DIAGNOSIS — Z1389 Encounter for screening for other disorder: Secondary | ICD-10-CM | POA: Diagnosis not present

## 2015-11-11 DIAGNOSIS — E781 Pure hyperglyceridemia: Secondary | ICD-10-CM | POA: Diagnosis not present

## 2016-03-04 ENCOUNTER — Ambulatory Visit (INDEPENDENT_AMBULATORY_CARE_PROVIDER_SITE_OTHER): Payer: Medicare Other | Admitting: Neurology

## 2016-03-04 ENCOUNTER — Encounter: Payer: Self-pay | Admitting: Neurology

## 2016-03-04 VITALS — BP 158/86 | HR 58 | Ht 62.0 in | Wt 160.6 lb

## 2016-03-04 DIAGNOSIS — R413 Other amnesia: Secondary | ICD-10-CM | POA: Diagnosis not present

## 2016-03-04 NOTE — Progress Notes (Signed)
Reason for visit: Memory disturbance  Todd Holmes is an 82 y.o. male  History of present illness:  Todd Holmes is an 82 year old right-handed black male with a history of a mild memory disturbance. The patient mainly had some difficulty remembering names of people. He may go into a room and not remember what he went in the room for. Overall, he believes that there has not been much change in his memory since last seen 6 months ago. The patient has gone off of Aricept, he has done well off the medication. The patient has not given up any activities of daily living because of the memory problem. He does report occasional cramps at night in the toes, occasional episodes of numbness or tingling in the anterolateral aspects of the thighs bilaterally, no back pain is noted. He returns for an evaluation.  Past Medical History  Diagnosis Date  . Arthritis     knees  . Keloid     keloid across chest, pt. unsure of how he encountered it  . Cancer [facility])     prostate, tx 2005- radiation   . Neuromuscular disorder ([place])     benign- tremor- occas., treated /w nadolol  . Memory disorder 11/28/2014  . Tremor 11/28/2014    Past Surgical History  Procedure Laterality Date  . Hernia repair      bilateral hernia repair, inguinal, 1980's    . Eye surgery Bilateral     L cataract removed & IOL  . Cataract extraction w/phaco  09/23/2011    Procedure: CATARACT EXTRACTION PHACO AND INTRAOCULAR LENS PLACEMENT (IOC);  Surgeon: Marylynn Pearson, MD;  Location: [facility];  Service: Ophthalmology;  Laterality: Right;    Family History  Problem Relation Age of Onset  . Anesthesia problems Neg Hx   . Hypotension Neg Hx   . Malignant hyperthermia Neg Hx   . Pseudochol deficiency Neg Hx   . Cancer Mother   . Stroke Father   . Lymphoma Brother     Social history:  reports that he has never smoked. He has never used smokeless tobacco. He reports that he does not drink alcohol or use illicit drugs.   No Known  Allergies  Medications:  Prior to Admission medications   Medication Sig Start Date End Date Taking? Authorizing Provider  aspirin EC 81 MG tablet Take 81 mg by mouth daily.     Yes Historical Provider, MD  Carboxymethylcellulose Sodium (REFRESH OP) Apply 2-3 drops to eye daily as needed.    Yes Historical Provider, MD  loratadine (CLARITIN) 10 MG tablet Take 10 mg by mouth daily as needed. Reported on 08/29/2015   Yes Historical Provider, MD  Multiple Vitamins-Minerals (MULTIVITAMINS THER. W/MINERALS) TABS Take 1 tablet by mouth daily.     Yes Historical Provider, MD  nadolol (CORGARD) 80 MG tablet Take 80 mg by mouth daily.    Yes Historical Provider, MD    ROS:  Out of a complete 14 system review of symptoms, the patient complains only of the following symptoms, and all other reviewed systems are negative.  Insomnia, frequent waking Muscle cramps Memory loss, numbness, tremors  Blood pressure 158/86, pulse 58, height 5\' 2"  (1.575 m), weight 160 lb 9.6 oz (72.848 kg).  Physical Exam  General: The patient is alert and cooperative at the time of the examination.  Skin: No significant peripheral edema is noted.   Neurologic Exam  Mental status: The patient is alert and oriented x 3 at the time  of the examination. The patient has apparent normal recent and remote memory, with an apparently normal attention span and concentration ability. Mini-Mental Status Examination done today shows a total score 28/30.   Cranial nerves: Facial symmetry is present. Speech is normal, no aphasia or dysarthria is noted. Extraocular movements are full. Visual fields are full.  Motor: The patient has good strength in all 4 extremities.  Sensory examination: Soft touch sensation is symmetric on the face, arms, and legs.  Coordination: The patient has good finger-nose-finger and heel-to-shin bilaterally.  Gait and station: The patient has a normal gait. Tandem gait is normal. Romberg is negative.  No drift is seen.  Reflexes: Deep tendon reflexes are symmetric.   Assessment/Plan:  1. Mild memory disturbance  The patient is relatively stable with his memory. He does not wish to go back on Aricept at this time, I discussed the possibility of entering into research for memory problems, the patient does not wish to consider this option at this time. He will follow-up in one year, sooner if needed.  Jill Alexanders MD 03/04/2016 5:26 PM  [facility] [address] [place], [place] [address]  Phone [phone] Fax [phone]

## 2016-05-07 DIAGNOSIS — Z8546 Personal history of malignant neoplasm of prostate: Secondary | ICD-10-CM | POA: Diagnosis not present

## 2016-05-07 DIAGNOSIS — N489 Disorder of penis, unspecified: Secondary | ICD-10-CM | POA: Diagnosis not present

## 2016-05-22 ENCOUNTER — Other Ambulatory Visit: Payer: Self-pay

## 2016-05-29 DIAGNOSIS — R51 Headache: Secondary | ICD-10-CM | POA: Diagnosis not present

## 2016-06-18 DIAGNOSIS — Z23 Encounter for immunization: Secondary | ICD-10-CM | POA: Diagnosis not present

## 2016-07-05 DIAGNOSIS — R509 Fever, unspecified: Secondary | ICD-10-CM | POA: Diagnosis not present

## 2016-07-05 DIAGNOSIS — R05 Cough: Secondary | ICD-10-CM | POA: Diagnosis not present

## 2016-07-12 DIAGNOSIS — J069 Acute upper respiratory infection, unspecified: Secondary | ICD-10-CM | POA: Diagnosis not present

## 2016-07-12 DIAGNOSIS — R05 Cough: Secondary | ICD-10-CM | POA: Diagnosis not present

## 2016-09-11 DIAGNOSIS — H47323 Drusen of optic disc, bilateral: Secondary | ICD-10-CM | POA: Diagnosis not present

## 2016-09-11 DIAGNOSIS — H0289 Other specified disorders of eyelid: Secondary | ICD-10-CM | POA: Diagnosis not present

## 2016-11-11 DIAGNOSIS — K627 Radiation proctitis: Secondary | ICD-10-CM | POA: Diagnosis not present

## 2016-11-11 DIAGNOSIS — J309 Allergic rhinitis, unspecified: Secondary | ICD-10-CM | POA: Diagnosis not present

## 2016-11-11 DIAGNOSIS — G250 Essential tremor: Secondary | ICD-10-CM | POA: Diagnosis not present

## 2016-11-11 DIAGNOSIS — Z1389 Encounter for screening for other disorder: Secondary | ICD-10-CM | POA: Diagnosis not present

## 2016-11-11 DIAGNOSIS — R413 Other amnesia: Secondary | ICD-10-CM | POA: Diagnosis not present

## 2016-11-11 DIAGNOSIS — G4700 Insomnia, unspecified: Secondary | ICD-10-CM | POA: Diagnosis not present

## 2016-11-11 DIAGNOSIS — R7301 Impaired fasting glucose: Secondary | ICD-10-CM | POA: Diagnosis not present

## 2016-11-11 DIAGNOSIS — Z0000 Encounter for general adult medical examination without abnormal findings: Secondary | ICD-10-CM | POA: Diagnosis not present

## 2016-11-11 DIAGNOSIS — C61 Malignant neoplasm of prostate: Secondary | ICD-10-CM | POA: Diagnosis not present

## 2016-11-11 DIAGNOSIS — E785 Hyperlipidemia, unspecified: Secondary | ICD-10-CM | POA: Diagnosis not present

## 2017-01-12 DIAGNOSIS — E781 Pure hyperglyceridemia: Secondary | ICD-10-CM | POA: Diagnosis not present

## 2017-01-15 DIAGNOSIS — R7303 Prediabetes: Secondary | ICD-10-CM | POA: Diagnosis not present

## 2017-01-15 DIAGNOSIS — G250 Essential tremor: Secondary | ICD-10-CM | POA: Diagnosis not present

## 2017-01-15 DIAGNOSIS — E785 Hyperlipidemia, unspecified: Secondary | ICD-10-CM | POA: Diagnosis not present

## 2017-03-08 ENCOUNTER — Encounter: Payer: Self-pay | Admitting: Adult Health

## 2017-03-08 ENCOUNTER — Ambulatory Visit (INDEPENDENT_AMBULATORY_CARE_PROVIDER_SITE_OTHER): Payer: Medicare Other | Admitting: Adult Health

## 2017-03-08 ENCOUNTER — Encounter (INDEPENDENT_AMBULATORY_CARE_PROVIDER_SITE_OTHER): Payer: Self-pay

## 2017-03-08 VITALS — BP 158/69 | HR 53 | Ht 63.0 in | Wt 155.8 lb

## 2017-03-08 DIAGNOSIS — G479 Sleep disorder, unspecified: Secondary | ICD-10-CM | POA: Diagnosis not present

## 2017-03-08 DIAGNOSIS — R413 Other amnesia: Secondary | ICD-10-CM

## 2017-03-08 NOTE — Progress Notes (Signed)
I have read the note, and I agree with the clinical assessment and plan.  Altan Kraai KEITH   

## 2017-03-08 NOTE — Patient Instructions (Signed)
We will continue to  Monitor memory Can try Melatonin 1-3 mg tablet 2 hour before bedtime If your symptoms worsen or you develop new symptoms please let us know.

## 2017-03-08 NOTE — Progress Notes (Signed)
PATIENT: LOYS Holmes DOB: Jan 14, 1929  REASON FOR VISIT: follow up- memory disturbance HISTORY FROM: patient  HISTORY OF PRESENT ILLNESS: Mr. Todd Holmes is an 83 year old male with a history of memory disturbance. He returns today for follow-up. He feels that his memory has remained stable. He is able to complete all ADLs independently. 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 He is able to prepare meals if needed. He states that he typically goes to bed around 1:59 AM and will be by 6:54 AM.. Denies hallucinations. Denies any changes with his mood or behavior. He reports occasionally he does have some mild depression but it resolves fairly quickly. He is currently not on any memory medication. He returns today for an evaluation.   HISTORY 03/04/16: Mr. Todd Holmes is an 83 year old right-handed black male with a history of a mild memory disturbance. The patient mainly had some difficulty remembering names of people. He may go into a room and not remember what he went in the room for. Overall, he believes that there has not been much change in his memory since last seen 6 months ago. The patient has gone off of Aricept, he has done well off the medication. The patient has not given up any activities of daily living because of the memory problem. He does report occasional cramps at night in the toes, occasional episodes of numbness or tingling in the anterolateral aspects of the thighs bilaterally, no back pain is noted. He returns for an evaluation.  REVIEW OF SYSTEMS: Out of a complete 14 system review of symptoms, the patient complains only of the following symptoms, and all other reviewed systems are negative.  ALLERGIES: No Known Allergies  HOME MEDICATIONS: Outpatient Medications Prior to Visit  Medication Sig Dispense Refill  . aspirin EC 81 MG tablet Take 81 mg by mouth daily.      . Carboxymethylcellulose Sodium (REFRESH OP) Apply 2-3 drops to eye  daily as needed.     . loratadine (CLARITIN) 10 MG tablet Take 10 mg by mouth daily as needed. Reported on 08/29/2015    . Multiple Vitamins-Minerals (MULTIVITAMINS THER. W/MINERALS) TABS Take 1 tablet by mouth daily.      . nadolol (CORGARD) 80 MG tablet Take 80 mg by mouth daily.      No facility-administered medications prior to visit.     PAST MEDICAL HISTORY: Past Medical History:  Diagnosis Date  . Arthritis    knees  . Cancer [facility])    prostate, tx 2005- radiation   . Keloid    keloid across chest, pt. unsure of how he encountered it  . Memory disorder 11/28/2014  . Neuromuscular disorder ([place])    benign- tremor- occas., treated /w nadolol  . Tremor 11/28/2014    PAST SURGICAL HISTORY: Past Surgical History:  Procedure Laterality Date  . CATARACT EXTRACTION W/PHACO  09/23/2011   Procedure: CATARACT EXTRACTION PHACO AND INTRAOCULAR LENS PLACEMENT (IOC);  Surgeon: Marylynn Pearson, MD;  Location: [facility];  Service: Ophthalmology;  Laterality: Right;  . EYE SURGERY Bilateral    L cataract removed & IOL  . HERNIA REPAIR     bilateral hernia repair, inguinal, 1980's      FAMILY HISTORY: Family History  Problem Relation Age of Onset  . Anesthesia problems Neg Hx   . Hypotension Neg Hx   . Malignant hyperthermia Neg Hx   . Pseudochol deficiency Neg Hx   . Cancer Mother   . Stroke Father   .  Lymphoma Brother     SOCIAL HISTORY: Social History   Social History  . Marital status: Single    Spouse name: N/A  . Number of children: 0  . Years of education: 14   Occupational History  . retired    Social History Main Topics  . Smoking status: Never Smoker  . Smokeless tobacco: Never Used  . Alcohol use No  . Drug use: No  . Sexual activity: Not on file   Other Topics Concern  . Not on file   Social History Narrative   Patient is right handed.   Patient does not drink caffeine.      PHYSICAL EXAM  Vitals:   03/08/17 1535  BP: (!) 158/69  Pulse: (!) 53    Weight: 155 lb 12.8 oz (70.7 kg)  Height: 5\' 3"  (1.6 m)   Body mass index is 27.6 kg/m.   MMSE - Mini Mental State Exam 03/08/2017 03/04/2016 08/29/2015  Orientation to time 5 5 5   Orientation to Place 5 5 5   Registration 3 3 3   Attention/ Calculation 4 4 5   Recall 1 2 2   Language- name 2 objects 2 2 2   Language- repeat 1 1 1   Language- follow 3 step command 3 3 3   Language- read & follow direction 1 1 1   Write a sentence 1 1 1   Copy design 1 1 1   Total score 27 28 29      Generalized: Well developed, in no acute distress   Neurological examination  Mentation: Alert oriented to time, place, history taking. Follows all commands speech and language fluent Cranial nerve II-XII: Pupils were equal round reactive to light. Extraocular movements were full, visual field were full on confrontational test. Facial sensation and strength were normal. Uvula tongue midline. Head turning and shoulder shrug  were normal and symmetric. Motor: The motor testing reveals 5 over 5 strength of all 4 extremities. Good symmetric motor tone is noted throughout.  Sensory: Sensory testing is intact to soft touch on all 4 extremities. No evidence of extinction is noted.  Coordination: Cerebellar testing reveals good finger-nose-finger and heel-to-shin bilaterally.  Gait and station: Gait is normal. Tandem gait is normal. Romberg is negative. No drift is seen.  Reflexes: Deep tendon reflexes are symmetric and normal bilaterally.   DIAGNOSTIC DATA (LABS, IMAGING, TESTING) - I reviewed patient records, labs, notes, testing and imaging myself where available.  Lab Results  Component Value Date   WBC 6.7 09/11/2011   HGB 15.8 09/11/2011   HCT 46.8 09/11/2011   MCV 87.8 09/11/2011   PLT 166 09/11/2011      Component Value Date/Time   NA 143 09/11/2011 1359   K 3.7 09/11/2011 1359   CL 105 09/11/2011 1359   CO2 27 09/11/2011 1359   GLUCOSE 109 (H) 09/11/2011 1359   BUN 14 09/11/2011 1359   CREATININE  0.93 09/11/2011 1359   CALCIUM 9.5 09/11/2011 1359   GFRNONAA 77 (L) 09/11/2011 1359   GFRAA 90 (L) 09/11/2011 1359      ASSESSMENT AND PLAN 83 y.o. year old male  has a past medical history of Arthritis; Cancer ([place]); Keloid; Memory disorder (11/28/2014); Neuromuscular disorder ([place]); and Tremor (11/28/2014). here with :  1. Memory disturbance 2. Sleep disturbance  The patient's memory score has remained stable. Over the years it has not been a big decline in his memory score. He is currently not on any medication for his memory. We will continue to monitor. The patient is  having trouble sleeping more than 4-5 hours each night. I advised that he can try melatonin 1-3 mg 2 hours before bedtime. Patient voices understanding. He will follow-up in 6 months or sooner if needed.    Ward Givens, MSN, NP-C 03/08/2017, 3:35 PM [facility] [address], [place], [place] [address] [phone]

## 2017-05-04 DIAGNOSIS — C61 Malignant neoplasm of prostate: Secondary | ICD-10-CM | POA: Diagnosis not present

## 2017-05-11 DIAGNOSIS — N489 Disorder of penis, unspecified: Secondary | ICD-10-CM | POA: Diagnosis not present

## 2017-05-11 DIAGNOSIS — Z8546 Personal history of malignant neoplasm of prostate: Secondary | ICD-10-CM | POA: Diagnosis not present

## 2017-05-12 DIAGNOSIS — J309 Allergic rhinitis, unspecified: Secondary | ICD-10-CM | POA: Diagnosis not present

## 2017-05-12 DIAGNOSIS — K627 Radiation proctitis: Secondary | ICD-10-CM | POA: Diagnosis not present

## 2017-05-12 DIAGNOSIS — G250 Essential tremor: Secondary | ICD-10-CM | POA: Diagnosis not present

## 2017-05-12 DIAGNOSIS — R7303 Prediabetes: Secondary | ICD-10-CM | POA: Diagnosis not present

## 2017-05-12 DIAGNOSIS — G4700 Insomnia, unspecified: Secondary | ICD-10-CM | POA: Diagnosis not present

## 2017-05-12 DIAGNOSIS — R197 Diarrhea, unspecified: Secondary | ICD-10-CM | POA: Diagnosis not present

## 2017-05-20 DIAGNOSIS — K5790 Diverticulosis of intestine, part unspecified, without perforation or abscess without bleeding: Secondary | ICD-10-CM | POA: Diagnosis not present

## 2017-06-08 ENCOUNTER — Telehealth: Payer: Self-pay | Admitting: Adult Health

## 2017-06-08 NOTE — Telephone Encounter (Signed)
Called patient to discuss. He stated that when he last saw Edman Circle, NP she suggested melatonin 1-3 mg at night to help him sleep. He stated he was only able to find 1 mg in gummies for children. He stated he only found 5 mg,  And he has been taking it nightly. He stated he has started having "crazy dreams".  He had multiple questions regarding melatonin side effects, it's safety with his other medications , whether he can crush and take less. This RN advised he stop taking melatonin, see if the dreams go away. Advised he talk to a pharmacist to discuss his questions. Advised him this RN found 3 mg tablets on Internet, so advised if he takes it again, a lower dose is available.  He stated he would call his pharmacist, verbalized understanding, appreciation of call back.

## 2017-06-08 NOTE — Telephone Encounter (Signed)
Pt called in he has some questions reg melatonin 5mg . He has been using this for awhile. The patient does not want to give any information. He is requesting to speak with [facility]

## 2017-06-08 NOTE — Telephone Encounter (Signed)
LVM requesting call back.

## 2017-07-01 DIAGNOSIS — Z23 Encounter for immunization: Secondary | ICD-10-CM | POA: Diagnosis not present

## 2017-08-06 DIAGNOSIS — R112 Nausea with vomiting, unspecified: Secondary | ICD-10-CM | POA: Diagnosis not present

## 2017-08-12 DIAGNOSIS — M25551 Pain in right hip: Secondary | ICD-10-CM | POA: Diagnosis not present

## 2017-08-12 DIAGNOSIS — M545 Low back pain: Secondary | ICD-10-CM | POA: Diagnosis not present

## 2017-08-16 DIAGNOSIS — C61 Malignant neoplasm of prostate: Secondary | ICD-10-CM | POA: Diagnosis not present

## 2017-08-16 DIAGNOSIS — R1031 Right lower quadrant pain: Secondary | ICD-10-CM | POA: Diagnosis not present

## 2017-08-16 DIAGNOSIS — R030 Elevated blood-pressure reading, without diagnosis of hypertension: Secondary | ICD-10-CM | POA: Diagnosis not present

## 2017-08-18 ENCOUNTER — Other Ambulatory Visit: Payer: Self-pay | Admitting: Internal Medicine

## 2017-08-18 DIAGNOSIS — R1031 Right lower quadrant pain: Secondary | ICD-10-CM

## 2017-08-19 ENCOUNTER — Other Ambulatory Visit: Payer: Medicare Other

## 2017-08-23 ENCOUNTER — Other Ambulatory Visit: Payer: Medicare Other

## 2017-08-26 ENCOUNTER — Other Ambulatory Visit: Payer: Medicare Other

## 2017-09-01 ENCOUNTER — Ambulatory Visit
Admission: RE | Admit: 2017-09-01 | Discharge: 2017-09-01 | Disposition: A | Discharge status: Home / Self Care | Payer: Medicare Other | Source: Ambulatory Visit | Attending: Internal Medicine | Admitting: Internal Medicine

## 2017-09-01 DIAGNOSIS — R1031 Right lower quadrant pain: Secondary | ICD-10-CM

## 2017-09-01 DIAGNOSIS — K5730 Diverticulosis of large intestine without perforation or abscess without bleeding: Secondary | ICD-10-CM | POA: Diagnosis not present

## 2017-09-01 MED ORDER — IOHEXOL 300 MG/ML  SOLN
30.0000 mL | Freq: Once | INTRAMUSCULAR | Status: AC | PRN
  Administered 2017-09-01: 30 mL via ORAL

## 2017-09-02 ENCOUNTER — Ambulatory Visit: Payer: Medicare Other | Admitting: Adult Health

## 2017-10-01 DIAGNOSIS — J069 Acute upper respiratory infection, unspecified: Secondary | ICD-10-CM | POA: Diagnosis not present

## 2017-10-27 DIAGNOSIS — H43813 Vitreous degeneration, bilateral: Secondary | ICD-10-CM | POA: Diagnosis not present

## 2017-11-15 DIAGNOSIS — C61 Malignant neoplasm of prostate: Secondary | ICD-10-CM | POA: Diagnosis not present

## 2017-11-15 DIAGNOSIS — Z7189 Other specified counseling: Secondary | ICD-10-CM | POA: Diagnosis not present

## 2017-11-15 DIAGNOSIS — G250 Essential tremor: Secondary | ICD-10-CM | POA: Diagnosis not present

## 2017-11-15 DIAGNOSIS — Z1389 Encounter for screening for other disorder: Secondary | ICD-10-CM | POA: Diagnosis not present

## 2017-11-15 DIAGNOSIS — R413 Other amnesia: Secondary | ICD-10-CM | POA: Diagnosis not present

## 2017-11-15 DIAGNOSIS — N5201 Erectile dysfunction due to arterial insufficiency: Secondary | ICD-10-CM | POA: Diagnosis not present

## 2017-11-15 DIAGNOSIS — Z0000 Encounter for general adult medical examination without abnormal findings: Secondary | ICD-10-CM | POA: Diagnosis not present

## 2017-11-15 DIAGNOSIS — E785 Hyperlipidemia, unspecified: Secondary | ICD-10-CM | POA: Diagnosis not present

## 2017-11-15 DIAGNOSIS — K5730 Diverticulosis of large intestine without perforation or abscess without bleeding: Secondary | ICD-10-CM | POA: Diagnosis not present

## 2017-11-15 DIAGNOSIS — K627 Radiation proctitis: Secondary | ICD-10-CM | POA: Diagnosis not present

## 2017-12-02 DIAGNOSIS — C61 Malignant neoplasm of prostate: Secondary | ICD-10-CM | POA: Diagnosis not present

## 2017-12-22 ENCOUNTER — Ambulatory Visit (INDEPENDENT_AMBULATORY_CARE_PROVIDER_SITE_OTHER): Payer: Medicare Other | Admitting: Adult Health

## 2017-12-22 ENCOUNTER — Encounter: Payer: Self-pay | Admitting: Adult Health

## 2017-12-22 ENCOUNTER — Encounter (INDEPENDENT_AMBULATORY_CARE_PROVIDER_SITE_OTHER): Payer: Self-pay

## 2017-12-22 VITALS — BP 165/73 | HR 50 | Ht 62.0 in | Wt 147.0 lb

## 2017-12-22 DIAGNOSIS — R413 Other amnesia: Secondary | ICD-10-CM

## 2017-12-22 NOTE — Patient Instructions (Signed)
Your Plan:  Continue to monitor symptoms Memory score is stable If your symptoms worsen or you develop new symptoms please let us know.       Thank you for coming to see us at [facility]. I hope we have been able to provide you high quality care today.  You may receive a patient satisfaction survey over the next few weeks. We would appreciate your feedback and comments so that we may continue to improve ourselves and the health of our patients.  

## 2017-12-22 NOTE — Progress Notes (Signed)
PATIENT: Todd Holmes DOB: 1929/04/22  REASON FOR VISIT: follow up HISTORY FROM: patient  HISTORY OF PRESENT ILLNESS: Today 12/22/17 Todd Holmes is an 84 year old male with a history of memory disturbance.  He returns today for follow-up.  He lives at home alone.  He is able to complete all ADLs independently.  He operates a Teacher, music without difficulty.  He continues to manages his finances and prepares meals without difficulty.  Denies hallucinations.  Denies any changes in mood or behavior.  Patient reports that he is not very social but he does participate in church activities.  He states that sometimes in the morning he may feel slightly depressed but this feeling slowly resolves as the day progresses.  He returns today for an evaluation.   HISTORY Todd Holmes is an 84 year old male with a history of memory disturbance. He returns today for follow-up. He feels that his memory has remained stable. He is able to complete all ADLs independently. 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 He is able to prepare meals if needed. He states that he typically goes to bed around 1:59 AM and will be by 6:54 AM.. Denies hallucinations. Denies any changes with his mood or behavior. He reports occasionally he does have some mild depression but it resolves fairly quickly. He is currently not on any memory medication. He returns today for an evaluation.    REVIEW OF SYSTEMS: Out of a complete 14 system review of symptoms, the patient complains only of the following symptoms, and all other reviewed systems are negative.  See HPI  ALLERGIES: No Known Allergies  HOME MEDICATIONS: Outpatient Medications Prior to Visit  Medication Sig Dispense Refill  . aspirin EC 81 MG tablet Take 81 mg by mouth daily.      . Carboxymethylcellulose Sodium (REFRESH OP) Apply 2-3 drops to eye daily as needed.     . fenofibrate 160 MG tablet Take 160 mg by mouth daily.     Marland Kitchen  loratadine (CLARITIN) 10 MG tablet Take 10 mg by mouth daily as needed. Reported on 08/29/2015    . Multiple Vitamins-Minerals (MULTIVITAMINS THER. W/MINERALS) TABS Take 1 tablet by mouth daily.      . nadolol (CORGARD) 80 MG tablet Take 80 mg by mouth daily.      No facility-administered medications prior to visit.     PAST MEDICAL HISTORY: Past Medical History:  Diagnosis Date  . Arthritis    knees  . Cancer [facility])    prostate, tx 2005- radiation   . Keloid    keloid across chest, pt. unsure of how he encountered it  . Memory disorder 11/28/2014  . Neuromuscular disorder ([place])    benign- tremor- occas., treated /w nadolol  . Tremor 11/28/2014    PAST SURGICAL HISTORY: Past Surgical History:  Procedure Laterality Date  . CATARACT EXTRACTION W/PHACO  09/23/2011   Procedure: CATARACT EXTRACTION PHACO AND INTRAOCULAR LENS PLACEMENT (IOC);  Surgeon: Marylynn Pearson, MD;  Location: [facility];  Service: Ophthalmology;  Laterality: Right;  . EYE SURGERY Bilateral    L cataract removed & IOL  . HERNIA REPAIR     bilateral hernia repair, inguinal, 1980's      FAMILY HISTORY: Family History  Problem Relation Age of Onset  . Anesthesia problems Neg Hx   . Hypotension Neg Hx   . Malignant hyperthermia Neg Hx   . Pseudochol deficiency Neg Hx   . Cancer Mother   . Stroke  Father   . Lymphoma Brother     SOCIAL HISTORY: Social History   Socioeconomic History  . Marital status: Single    Spouse name: Not on file  . Number of children: 0  . Years of education: 53  . Highest education level: Not on file  Occupational History  . Occupation: retired  Scientific laboratory technician  . Financial resource strain: Not on file  . Food insecurity:    Worry: Not on file    Inability: Not on file  . Transportation needs:    Medical: Not on file    Non-medical: Not on file  Tobacco Use  . Smoking status: Never Smoker  . Smokeless tobacco: Never Used  Substance and Sexual Activity  . Alcohol use: No  . Drug  use: No  . Sexual activity: Not on file  Lifestyle  . Physical activity:    Days per week: Not on file    Minutes per session: Not on file  . Stress: Not on file  Relationships  . Social connections:    Talks on phone: Not on file    Gets together: Not on file    Attends religious service: Not on file    Active member of club or organization: Not on file    Attends meetings of clubs or organizations: Not on file    Relationship status: Not on file  . Intimate partner violence:    Fear of current or ex partner: Not on file    Emotionally abused: Not on file    Physically abused: Not on file    Forced sexual activity: Not on file  Other Topics Concern  . Not on file  Social History Narrative   Patient is right handed.   Patient does not drink caffeine.      PHYSICAL EXAM  Vitals:   12/22/17 1428  BP: (!) 165/73  Pulse: (!) 50  Weight: 147 lb (66.7 kg)  Height: 5\' 2"  (1.575 m)   Body mass index is 26.89 kg/m.   MMSE - Mini Mental State Exam 12/22/2017 03/08/2017 03/04/2016  Orientation to time 5 5 5   Orientation to Place 4 5 5   Registration 3 3 3   Attention/ Calculation 4 4 4   Recall 2 1 2   Language- name 2 objects 2 2 2   Language- repeat 1 1 1   Language- follow 3 step command 3 3 3   Language- read & follow direction 1 1 1   Write a sentence 1 1 1   Copy design 1 1 1   Total score 27 27 28      Generalized: Well developed, in no acute distress   Neurological examination  Mentation: Alert oriented to time, place, history taking. Follows all commands speech and language fluent Cranial nerve II-XII: Pupils were equal round reactive to light. Extraocular movements were full, visual field were full on confrontational test. Facial sensation and strength were normal. Uvula tongue midline. Head turning and shoulder shrug  were normal and symmetric. Motor: The motor testing reveals 5 over 5 strength of all 4 extremities. Good symmetric motor tone is noted throughout.    Sensory: Sensory testing is intact to soft touch on all 4 extremities. No evidence of extinction is noted.  Coordination: Cerebellar testing reveals good finger-nose-finger and heel-to-shin bilaterally.  Gait and station: Gait is normal. Tandem gait is normal. Romberg is negative. No drift is seen.  Reflexes: Deep tendon reflexes are symmetric and normal bilaterally.   DIAGNOSTIC DATA (LABS, IMAGING, TESTING) - I reviewed patient records,  labs, notes, testing and imaging myself where available.  Lab Results  Component Value Date   WBC 6.7 09/11/2011   HGB 15.8 09/11/2011   HCT 46.8 09/11/2011   MCV 87.8 09/11/2011   PLT 166 09/11/2011      Component Value Date/Time   NA 143 09/11/2011 1359   K 3.7 09/11/2011 1359   CL 105 09/11/2011 1359   CO2 27 09/11/2011 1359   GLUCOSE 109 (H) 09/11/2011 1359   BUN 14 09/11/2011 1359   CREATININE 0.93 09/11/2011 1359   CALCIUM 9.5 09/11/2011 1359   GFRNONAA 77 (L) 09/11/2011 1359   GFRAA 90 (L) 09/11/2011 1359      ASSESSMENT AND PLAN 84 y.o. year old male  has a past medical history of Arthritis, Cancer ([place]), Keloid, Memory disorder (11/28/2014), Neuromuscular disorder ([place]), and Tremor (11/28/2014). here with:  1.  Memory disturbance  Patient has remained stable.  We will continue to monitor the memory.  We discussed his symptoms of depression however the patient does not feel medication is needed at this time.  Patient is advised that if his symptoms worsen or he develops new symptoms he should let us know.  He will follow-up in 1 year or sooner if needed.     Ward Givens, MSN, NP-C 12/22/2017, 2:28 PM [facility] [address] [place], [place] [address] [phone]  I spent 15 minutes with the patient. 50% of this time was spent discussing his memory score

## 2017-12-22 NOTE — Progress Notes (Signed)
I have read the note, and I agree with the clinical assessment and plan.  Roey Coopman K Kiri Hinderliter   

## 2018-01-05 DIAGNOSIS — C61 Malignant neoplasm of prostate: Secondary | ICD-10-CM | POA: Diagnosis not present

## 2018-01-13 DIAGNOSIS — R9721 Rising PSA following treatment for malignant neoplasm of prostate: Secondary | ICD-10-CM | POA: Diagnosis not present

## 2018-01-13 DIAGNOSIS — Z8546 Personal history of malignant neoplasm of prostate: Secondary | ICD-10-CM | POA: Diagnosis not present

## 2018-01-19 ENCOUNTER — Other Ambulatory Visit (HOSPITAL_COMMUNITY): Payer: Self-pay | Admitting: Urology

## 2018-01-19 DIAGNOSIS — Z8546 Personal history of malignant neoplasm of prostate: Secondary | ICD-10-CM

## 2018-02-01 ENCOUNTER — Encounter (HOSPITAL_COMMUNITY)
Admission: RE | Admit: 2018-02-01 | Discharge: 2018-02-01 | Disposition: A | Discharge status: Home / Self Care | Payer: Medicare Other | Source: Ambulatory Visit | Attending: Urology | Admitting: Urology

## 2018-02-01 DIAGNOSIS — C61 Malignant neoplasm of prostate: Secondary | ICD-10-CM | POA: Diagnosis not present

## 2018-02-01 DIAGNOSIS — R9721 Rising PSA following treatment for malignant neoplasm of prostate: Secondary | ICD-10-CM | POA: Diagnosis not present

## 2018-02-01 DIAGNOSIS — M5136 Other intervertebral disc degeneration, lumbar region: Secondary | ICD-10-CM | POA: Insufficient documentation

## 2018-02-01 DIAGNOSIS — Z8546 Personal history of malignant neoplasm of prostate: Secondary | ICD-10-CM | POA: Diagnosis not present

## 2018-02-01 DIAGNOSIS — M4186 Other forms of scoliosis, lumbar region: Secondary | ICD-10-CM | POA: Diagnosis not present

## 2018-02-01 DIAGNOSIS — K5730 Diverticulosis of large intestine without perforation or abscess without bleeding: Secondary | ICD-10-CM | POA: Diagnosis not present

## 2018-02-01 MED ORDER — TECHNETIUM TC 99M MEDRONATE IV KIT
20.0000 | PACK | Freq: Once | INTRAVENOUS | Status: AC | PRN
  Administered 2018-02-01: 21.8 via INTRAVENOUS

## 2018-05-23 DIAGNOSIS — N183 Chronic kidney disease, stage 3 (moderate): Secondary | ICD-10-CM | POA: Diagnosis not present

## 2018-05-23 DIAGNOSIS — G250 Essential tremor: Secondary | ICD-10-CM | POA: Diagnosis not present

## 2018-05-23 DIAGNOSIS — Z23 Encounter for immunization: Secondary | ICD-10-CM | POA: Diagnosis not present

## 2018-05-23 DIAGNOSIS — E781 Pure hyperglyceridemia: Secondary | ICD-10-CM | POA: Diagnosis not present

## 2018-10-20 DIAGNOSIS — R3915 Urgency of urination: Secondary | ICD-10-CM | POA: Diagnosis not present

## 2018-10-20 DIAGNOSIS — C61 Malignant neoplasm of prostate: Secondary | ICD-10-CM | POA: Diagnosis not present

## 2018-11-01 DIAGNOSIS — C61 Malignant neoplasm of prostate: Secondary | ICD-10-CM | POA: Diagnosis not present

## 2018-11-02 ENCOUNTER — Other Ambulatory Visit (HOSPITAL_COMMUNITY): Payer: Self-pay | Admitting: Urology

## 2018-11-02 ENCOUNTER — Other Ambulatory Visit: Payer: Self-pay | Admitting: Urology

## 2018-11-02 DIAGNOSIS — R3915 Urgency of urination: Secondary | ICD-10-CM | POA: Diagnosis not present

## 2018-11-02 DIAGNOSIS — C61 Malignant neoplasm of prostate: Secondary | ICD-10-CM

## 2018-11-14 ENCOUNTER — Encounter (HOSPITAL_COMMUNITY)
Admission: RE | Admit: 2018-11-14 | Discharge: 2018-11-14 | Disposition: A | Discharge status: Home / Self Care | Payer: Medicare Other | Source: Ambulatory Visit | Attending: Urology | Admitting: Urology

## 2018-11-14 ENCOUNTER — Encounter: Payer: Self-pay | Admitting: Adult Health

## 2018-11-14 DIAGNOSIS — C61 Malignant neoplasm of prostate: Secondary | ICD-10-CM | POA: Insufficient documentation

## 2018-11-14 MED ORDER — TECHNETIUM TC 99M MEDRONATE IV KIT
21.4000 | PACK | Freq: Once | INTRAVENOUS | Status: AC | PRN
  Administered 2018-11-14: 21.4 via INTRAVENOUS

## 2018-11-17 DIAGNOSIS — R9721 Rising PSA following treatment for malignant neoplasm of prostate: Secondary | ICD-10-CM | POA: Diagnosis not present

## 2018-11-17 DIAGNOSIS — C61 Malignant neoplasm of prostate: Secondary | ICD-10-CM | POA: Diagnosis not present

## 2018-11-17 DIAGNOSIS — C7951 Secondary malignant neoplasm of bone: Secondary | ICD-10-CM | POA: Diagnosis not present

## 2018-11-18 DIAGNOSIS — K627 Radiation proctitis: Secondary | ICD-10-CM | POA: Diagnosis not present

## 2018-11-18 DIAGNOSIS — Z1389 Encounter for screening for other disorder: Secondary | ICD-10-CM | POA: Diagnosis not present

## 2018-11-18 DIAGNOSIS — E785 Hyperlipidemia, unspecified: Secondary | ICD-10-CM | POA: Diagnosis not present

## 2018-11-18 DIAGNOSIS — R413 Other amnesia: Secondary | ICD-10-CM | POA: Diagnosis not present

## 2018-11-18 DIAGNOSIS — G250 Essential tremor: Secondary | ICD-10-CM | POA: Diagnosis not present

## 2018-11-18 DIAGNOSIS — Z0000 Encounter for general adult medical examination without abnormal findings: Secondary | ICD-10-CM | POA: Diagnosis not present

## 2018-11-18 DIAGNOSIS — N183 Chronic kidney disease, stage 3 (moderate): Secondary | ICD-10-CM | POA: Diagnosis not present

## 2018-11-18 DIAGNOSIS — C61 Malignant neoplasm of prostate: Secondary | ICD-10-CM | POA: Diagnosis not present

## 2018-12-26 ENCOUNTER — Ambulatory Visit: Payer: Medicare Other | Admitting: Adult Health

## 2019-01-20 ENCOUNTER — Other Ambulatory Visit (HOSPITAL_COMMUNITY): Payer: Self-pay | Admitting: Urology

## 2019-01-20 DIAGNOSIS — C61 Malignant neoplasm of prostate: Secondary | ICD-10-CM

## 2019-01-20 DIAGNOSIS — C7951 Secondary malignant neoplasm of bone: Secondary | ICD-10-CM

## 2019-02-21 DIAGNOSIS — C61 Malignant neoplasm of prostate: Secondary | ICD-10-CM | POA: Diagnosis not present

## 2019-03-08 ENCOUNTER — Telehealth: Payer: Self-pay | Admitting: *Deleted

## 2019-03-08 NOTE — Telephone Encounter (Signed)
Due to current COVID 19 pandemic, our office is severely reducing in office visits until further notice, in order to minimize the risk to our patients and healthcare providers. We see for memory.  He was unsure about using his phone.  Will make in office visit 03-22-19 at 1345 with SS/NP- Willis.  He was ok with this.

## 2019-03-09 ENCOUNTER — Ambulatory Visit: Payer: Medicare Other | Admitting: Adult Health

## 2019-03-21 NOTE — Progress Notes (Deleted)
PATIENT: Todd Holmes DOB: 06/17/1929  REASON FOR VISIT: follow up HISTORY FROM: patient  HISTORY OF PRESENT ILLNESS: Today 03/22/19 Todd Holmes is an 85 year old male with history of memory disturbance.  His last memory score was 27/30.   HISTORY  12/22/17 MM: Todd Holmes is an 85 year old male with a history of memory disturbance.  He returns today for follow-up.  He lives at home alone. He is able to complete all ADLs independently.  He operates a Teacher, music without difficulty.  He continues to manages his finances and prepares meals without difficulty.  Denies hallucinations.  Denies any changes in mood or behavior.  Patient reports that he is not very social but he does participate in church activities.  He states that sometimes in the morning he may feel slightly depressed but this feeling slowly resolves as the day progresses.  He returns today for an evaluation.  REVIEW OF SYSTEMS: Out of a complete 14 system review of symptoms, the patient complains only of the following symptoms, and all other reviewed systems are negative.  ALLERGIES: No Known Allergies  HOME MEDICATIONS: Outpatient Medications Prior to Visit  Medication Sig Dispense Refill  . aspirin EC 81 MG tablet Take 81 mg by mouth daily.      . Carboxymethylcellulose Sodium (REFRESH OP) Apply 2-3 drops to eye daily as needed.     . fenofibrate 160 MG tablet Take 160 mg by mouth daily.     Marland Kitchen loratadine (CLARITIN) 10 MG tablet Take 10 mg by mouth daily as needed. Reported on 08/29/2015    . Multiple Vitamins-Minerals (MULTIVITAMINS THER. W/MINERALS) TABS Take 1 tablet by mouth daily.      . nadolol (CORGARD) 80 MG tablet Take 80 mg by mouth daily.      No facility-administered medications prior to visit.     PAST MEDICAL HISTORY: Past Medical History:  Diagnosis Date  . Arthritis    knees  . Cancer [facility])    prostate, tx 2005- radiation   . Keloid    keloid across chest, pt. unsure of how he  encountered it  . Memory disorder 11/28/2014  . Neuromuscular disorder ([place])    benign- tremor- occas., treated /w nadolol  . Tremor 11/28/2014    PAST SURGICAL HISTORY: Past Surgical History:  Procedure Laterality Date  . CATARACT EXTRACTION W/PHACO  09/23/2011   Procedure: CATARACT EXTRACTION PHACO AND INTRAOCULAR LENS PLACEMENT (IOC);  Surgeon: Marylynn Pearson, MD;  Location: [place];  Service: Ophthalmology;  Laterality: Right;  . EYE SURGERY Bilateral    L cataract removed & IOL  . HERNIA REPAIR     bilateral hernia repair, inguinal, 1980's      FAMILY HISTORY: Family History  Problem Relation Age of Onset  . Anesthesia problems Neg Hx   . Hypotension Neg Hx   . Malignant hyperthermia Neg Hx   . Pseudochol deficiency Neg Hx   . Cancer Mother   . Stroke Father   . Lymphoma Brother     SOCIAL HISTORY: Social History   Socioeconomic History  . Marital status: Single    Spouse name: Not on file  . Number of children: 0  . Years of education: 52  . Highest education level: Not on file  Occupational History  . Occupation: retired  Scientific laboratory technician  . Financial resource strain: Not on file  . Food insecurity    Worry: Not on file    Inability: Not on file  . Transportation needs  Medical: Not on file    Non-medical: Not on file  Tobacco Use  . Smoking status: Never Smoker  . Smokeless tobacco: Never Used  Substance and Sexual Activity  . Alcohol use: No  . Drug use: No  . Sexual activity: Not on file  Lifestyle  . Physical activity    Days per week: Not on file    Minutes per session: Not on file  . Stress: Not on file  Relationships  . Social Herbalist on phone: Not on file    Gets together: Not on file    Attends religious service: Not on file    Active member of club or organization: Not on file    Attends meetings of clubs or organizations: Not on file    Relationship status: Not on file  . Intimate partner violence    Fear of current or ex  partner: Not on file    Emotionally abused: Not on file    Physically abused: Not on file    Forced sexual activity: Not on file  Other Topics Concern  . Not on file  Social History Narrative   Patient is right handed.   Patient does not drink caffeine.      PHYSICAL EXAM  There were no vitals filed for this visit. There is no height or weight on file to calculate BMI.  Generalized: Well developed, in no acute distress   Neurological examination  Mentation: Alert oriented to time, place, history taking. Follows all commands speech and language fluent Cranial nerve II-XII: Pupils were equal round reactive to light. Extraocular movements were full, visual field were full on confrontational test. Facial sensation and strength were normal. Uvula tongue midline. Head turning and shoulder shrug  were normal and symmetric. Motor: The motor testing reveals 5 over 5 strength of all 4 extremities. Good symmetric motor tone is noted throughout.  Sensory: Sensory testing is intact to soft touch on all 4 extremities. No evidence of extinction is noted.  Coordination: Cerebellar testing reveals good finger-nose-finger and heel-to-shin bilaterally.  Gait and station: Gait is normal. Tandem gait is normal. Romberg is negative. No drift is seen.  Reflexes: Deep tendon reflexes are symmetric and normal bilaterally.   DIAGNOSTIC DATA (LABS, IMAGING, TESTING) - I reviewed patient records, labs, notes, testing and imaging myself where available.  Lab Results  Component Value Date   WBC 6.7 09/11/2011   HGB 15.8 09/11/2011   HCT 46.8 09/11/2011   MCV 87.8 09/11/2011   PLT 166 09/11/2011      Component Value Date/Time   NA 143 09/11/2011 1359   K 3.7 09/11/2011 1359   CL 105 09/11/2011 1359   CO2 27 09/11/2011 1359   GLUCOSE 109 (H) 09/11/2011 1359   BUN 14 09/11/2011 1359   CREATININE 0.93 09/11/2011 1359   CALCIUM 9.5 09/11/2011 1359   GFRNONAA 77 (L) 09/11/2011 1359   GFRAA 90 (L)  09/11/2011 1359   No results found for: CHOL, HDL, LDLCALC, LDLDIRECT, TRIG, CHOLHDL No results found for: HGBA1C Lab Results  Component Value Date   [id number] 009 11/28/2014   No results found for: TSH    ASSESSMENT AND PLAN 85 y.o. year old male  has a past medical history of Arthritis, Cancer ([place]), Keloid, Memory disorder (11/28/2014), Neuromuscular disorder ([place]), and Tremor (11/28/2014). here with ***   I spent 15 minutes with the patient. 50% of this time was spent   Butler Denmark, AGNP-C, DNP 03/22/2019, 11:55 AM  [facility] [address], [address] [place], [place] [address] [phone]   

## 2019-03-22 ENCOUNTER — Telehealth: Payer: Self-pay | Admitting: Neurology

## 2019-03-22 ENCOUNTER — Ambulatory Visit: Payer: Self-pay | Admitting: Neurology

## 2019-03-22 NOTE — Telephone Encounter (Signed)
Patient called and CX his apt because he had diarrhea and upset stomach. Patient will call to reschedule .

## 2019-03-22 NOTE — Telephone Encounter (Signed)
Noted  

## 2019-03-23 DIAGNOSIS — K2900 Acute gastritis without bleeding: Secondary | ICD-10-CM | POA: Diagnosis not present

## 2019-05-23 DIAGNOSIS — C61 Malignant neoplasm of prostate: Secondary | ICD-10-CM | POA: Diagnosis not present

## 2019-05-30 ENCOUNTER — Encounter (HOSPITAL_COMMUNITY)
Admission: RE | Admit: 2019-05-30 | Discharge: 2019-05-30 | Disposition: A | Discharge status: Home / Self Care | Payer: Medicare Other | Source: Ambulatory Visit | Attending: Urology | Admitting: Urology

## 2019-05-30 ENCOUNTER — Other Ambulatory Visit: Payer: Self-pay

## 2019-05-30 DIAGNOSIS — C61 Malignant neoplasm of prostate: Secondary | ICD-10-CM | POA: Insufficient documentation

## 2019-05-30 DIAGNOSIS — C7951 Secondary malignant neoplasm of bone: Secondary | ICD-10-CM | POA: Diagnosis present

## 2019-05-30 MED ORDER — TECHNETIUM TC 99M MEDRONATE IV KIT
19.5000 | PACK | Freq: Once | INTRAVENOUS | Status: AC
  Administered 2019-05-30: 19.5 via INTRAVENOUS

## 2019-06-06 DIAGNOSIS — C7951 Secondary malignant neoplasm of bone: Secondary | ICD-10-CM | POA: Diagnosis not present

## 2019-06-06 DIAGNOSIS — C61 Malignant neoplasm of prostate: Secondary | ICD-10-CM | POA: Diagnosis not present

## 2019-07-12 DIAGNOSIS — K2900 Acute gastritis without bleeding: Secondary | ICD-10-CM | POA: Diagnosis not present

## 2019-07-12 DIAGNOSIS — C61 Malignant neoplasm of prostate: Secondary | ICD-10-CM | POA: Diagnosis not present

## 2019-07-12 DIAGNOSIS — Z23 Encounter for immunization: Secondary | ICD-10-CM | POA: Diagnosis not present

## 2019-07-26 DIAGNOSIS — H26492 Other secondary cataract, left eye: Secondary | ICD-10-CM | POA: Diagnosis not present

## 2019-07-26 DIAGNOSIS — H43813 Vitreous degeneration, bilateral: Secondary | ICD-10-CM | POA: Diagnosis not present

## 2019-07-26 DIAGNOSIS — Z961 Presence of intraocular lens: Secondary | ICD-10-CM | POA: Diagnosis not present

## 2019-07-26 DIAGNOSIS — H01001 Unspecified blepharitis right upper eyelid: Secondary | ICD-10-CM | POA: Diagnosis not present

## 2019-08-22 ENCOUNTER — Other Ambulatory Visit: Payer: Self-pay | Admitting: Internal Medicine

## 2019-08-22 DIAGNOSIS — R1013 Epigastric pain: Secondary | ICD-10-CM

## 2019-08-24 ENCOUNTER — Other Ambulatory Visit: Payer: Self-pay

## 2019-08-24 ENCOUNTER — Ambulatory Visit
Admission: RE | Admit: 2019-08-24 | Discharge: 2019-08-24 | Disposition: A | Discharge status: Home / Self Care | Payer: Medicare Other | Source: Ambulatory Visit | Attending: Internal Medicine | Admitting: Internal Medicine

## 2019-08-24 DIAGNOSIS — R1013 Epigastric pain: Secondary | ICD-10-CM | POA: Diagnosis not present

## 2020-05-22 ENCOUNTER — Ambulatory Visit: Payer: Medicare Other | Admitting: Podiatry

## 2020-05-23 ENCOUNTER — Ambulatory Visit: Payer: Medicare Other | Admitting: Podiatry

## 2020-05-23 ENCOUNTER — Encounter: Payer: Self-pay | Admitting: Podiatry

## 2020-05-23 ENCOUNTER — Other Ambulatory Visit: Payer: Self-pay

## 2020-05-23 DIAGNOSIS — M79675 Pain in left toe(s): Secondary | ICD-10-CM | POA: Diagnosis not present

## 2020-05-23 DIAGNOSIS — B351 Tinea unguium: Secondary | ICD-10-CM | POA: Diagnosis not present

## 2020-05-23 DIAGNOSIS — M79674 Pain in right toe(s): Secondary | ICD-10-CM

## 2020-05-24 NOTE — Progress Notes (Signed)
Subjective:   Patient ID: Todd Holmes, male   DOB: 86 y.o.   MRN: [id number]   HPI Patient presents with very thickened toenails of both feet and states that he cannot cut them himself and at times they do become tender when they get too long.  Patient states he cannot see them or reach them and he needs routine care for condition.  Patient does not smoke likes to be active if possible   Review of Systems  All other systems reviewed and are negative.       Objective:  Physical Exam Vitals and nursing note reviewed.  Constitutional:      Appearance: He is well-developed.  Pulmonary:     Effort: Pulmonary effort is normal.  Musculoskeletal:        General: Normal range of motion.  Skin:    General: Skin is warm.  Neurological:     Mental Status: He is alert.     Neurovascular status found to be intact muscle strength found to be adequate range of motion adequate.  Patient is found to have significant thick yellow brittle nailbeds 1-5 both feet that are painful and from a dorsal direction and difficult for him to cut.  He is noted to have good digital perfusion well oriented x3     Assessment:  Chronic mycotic nail infection 1-5 both feet with moderate discomfort associated with the length and incurvation     Plan:  H&P reviewed condition.  At this point I recommended routine debridement to help him and debridement commenced today with no iatrogenic bleeding and he will reappoint 3 months for routine care to take care of this chronic problem

## 2020-08-28 ENCOUNTER — Ambulatory Visit: Payer: Medicare Other | Admitting: Podiatry

## 2021-07-28 ENCOUNTER — Ambulatory Visit (HOSPITAL_COMMUNITY)
Admission: RE | Admit: 2021-07-28 | Discharge: 2021-07-28 | Disposition: A | Discharge status: Home / Self Care | Payer: Medicare Other | Source: Ambulatory Visit | Attending: Orthopedic Surgery | Admitting: Orthopedic Surgery

## 2021-07-28 ENCOUNTER — Other Ambulatory Visit (HOSPITAL_COMMUNITY): Payer: Self-pay | Admitting: Orthopedic Surgery

## 2021-07-28 ENCOUNTER — Other Ambulatory Visit: Payer: Self-pay

## 2021-07-28 DIAGNOSIS — M79662 Pain in left lower leg: Secondary | ICD-10-CM | POA: Insufficient documentation

## 2021-10-09 ENCOUNTER — Other Ambulatory Visit (HOSPITAL_COMMUNITY): Payer: Self-pay | Admitting: Urology

## 2021-10-09 ENCOUNTER — Other Ambulatory Visit: Payer: Self-pay | Admitting: Urology

## 2021-10-09 DIAGNOSIS — C61 Malignant neoplasm of prostate: Secondary | ICD-10-CM

## 2021-10-16 ENCOUNTER — Other Ambulatory Visit: Payer: Self-pay

## 2021-10-16 ENCOUNTER — Encounter (HOSPITAL_COMMUNITY)
Admission: RE | Admit: 2021-10-16 | Discharge: 2021-10-16 | Disposition: A | Discharge status: Home / Self Care | Payer: Medicare Other | Source: Ambulatory Visit | Attending: Urology | Admitting: Urology

## 2021-10-16 DIAGNOSIS — C61 Malignant neoplasm of prostate: Secondary | ICD-10-CM | POA: Diagnosis present

## 2021-10-16 MED ORDER — TECHNETIUM TC 99M MEDRONATE IV KIT
20.0000 | PACK | Freq: Once | INTRAVENOUS | Status: AC | PRN
  Administered 2021-10-16: 21.7 via INTRAVENOUS

## 2022-01-13 ENCOUNTER — Other Ambulatory Visit: Payer: Self-pay | Admitting: Internal Medicine

## 2022-01-13 ENCOUNTER — Ambulatory Visit
Admission: RE | Admit: 2022-01-13 | Discharge: 2022-01-13 | Disposition: A | Discharge status: Home / Self Care | Payer: Medicare Other | Source: Ambulatory Visit | Attending: Internal Medicine | Admitting: Internal Medicine

## 2022-01-13 DIAGNOSIS — M542 Cervicalgia: Secondary | ICD-10-CM

## 2022-03-10 ENCOUNTER — Other Ambulatory Visit: Payer: Self-pay | Admitting: Internal Medicine

## 2022-03-10 ENCOUNTER — Ambulatory Visit
Admission: RE | Admit: 2022-03-10 | Discharge: 2022-03-10 | Disposition: A | Discharge status: Home / Self Care | Payer: Medicare Other | Source: Ambulatory Visit | Attending: Internal Medicine | Admitting: Internal Medicine

## 2022-03-10 DIAGNOSIS — M545 Low back pain, unspecified: Secondary | ICD-10-CM

## 2022-04-30 ENCOUNTER — Telehealth: Payer: Self-pay | Admitting: Radiation Oncology

## 2022-04-30 NOTE — Telephone Encounter (Signed)
8/17 @ 9:13 am Left voicemail for patient to call our office to be schedule for consult with Dr. Tammi Klippel.

## 2022-05-12 ENCOUNTER — Institutional Professional Consult (permissible substitution): Payer: Medicare Other | Admitting: Radiation Oncology

## 2022-05-12 ENCOUNTER — Ambulatory Visit
Admission: RE | Admit: 2022-05-12 | Discharge: 2022-05-12 | Disposition: A | Discharge status: Home / Self Care | Payer: Medicare Other | Source: Ambulatory Visit | Attending: Radiation Oncology | Admitting: Radiation Oncology

## 2022-05-12 ENCOUNTER — Ambulatory Visit: Payer: Medicare Other

## 2022-05-12 DIAGNOSIS — Z192 Hormone resistant malignancy status: Secondary | ICD-10-CM

## 2022-05-13 NOTE — Progress Notes (Signed)
[facility] Location of Tumor / Histology: Prostate Ca  If Prostate Cancer, Gleason Score is (3 + 4) and PSA is (184.0 as of 04/16/2022, PSA of 89 in January 2023)      10/16/2021 Dr. Abner Greenspan NM Bone Scan Whole Body CLINICAL DATA:  Prostate cancer  FINDINGS: Since the prior examination, there has developed intense focal uptake at the sternomanubrial junction. While this may be degenerative in nature, focal metastatic disease is difficult to exclude. Additionally, there is focal uptake within the left fifth rib laterally, new since prior examination and suspicious for focal metastatic disease. Uptake is again seen along the concavity of a lumbar levoscoliosis, degenerative in nature. There is, however, intense focal uptake within the right sacral ala corresponding to the focal lesion noted on accompanying CT examination the abdomen pelvis on 10/15/2021 suspicious for development of an osseous metastasis.   IMPRESSION: Focal uptake within the sternum at the sternomanubrial junction, within the left fifth rib laterally, and within the right sacral ala, the latter of which corresponds to the sclerotic lesion seen on accompanying CT examination all suspicious for osseous metastatic disease.   05/30/2019 Dr. Karsten Ro NM Bone Scan Whole Body CLINICAL DATA:  Prostate cancer.  Bone metastases.  FINDINGS: Uptake in the posterior left ninth rib is less prominent than on the prior study. More lateral superior uptake is less prominent as well.  There is faint uptake in anterolateral right ribs which is new. No other new lesions are present. Degenerative changes are present in knees and shoulders.   IMPRESSION: 1. Previous left-sided lesions are less prominent on today's study may be degenerative or related to trauma rather than metastatic disease. Recommend continued follow-up. 2. 2 areas of faint uptake in the right anterior ribs could be related to disease. No other progression present. Recommend attention to  these areas on follow-up as well. 3. Degenerative changes are stable.   11/14/2018 Dr. Karsten Ro NM Bone Scan Whole Body CLINICAL DATA:  Prostate cancer, PSA 67.8 on 10/20/2018   FINDINGS: Thoracolumbar scoliosis with multiple sites of mild tracer localization, likely degenerative.  Abnormal increased tracer localization is identified at the medial aspect of the posterior LEFT ninth rib and a lateral upper LEFT rib question third versus fourth, sites suspicious for osseous metastatic disease.  Uptake at the LEFT lateral cervical spine is nonspecific, favor degenerative though a metastatic focus is not entirely excluded.  Additional scattered degenerative type uptake at the shoulders, elbows, LEFT wrist, knees, and RIGHT foot.  Expected urinary tract and soft tissue distribution of tracer.   IMPRESSION: Sites of abnormal osseous tracer accumulation at a lateral upper LEFT rib and at the posterior LEFT ninth rib, concerning for osseous metastases.   02/01/2018 Dr. Karsten Ro NM Bone Scan Whole Body CLINICAL DATA:  Prostate cancer, PSA 23.3 on 01/05/2018  FINDINGS: Uptake at the shoulders, knees, wrists, and feet typically degenerative.  Uptake at the RIGHT lateral aspect of the lumbar spine likely related to degenerative disc disease changes along the concave portion of noted levoconvex scoliosis, corresponding to CT findings.  No definite sites of abnormal osseous tracer accumulation are identified to suggest osseous metastatic disease.  Expected urinary tract and soft tissue distribution of tracer.   IMPRESSION: Scattered degenerative type uptake as above. Dextroconvex lumbar scoliosis.  No scintigraphic evidence of osseous metastatic disease.   Past/Anticipated interventions by urology, if any:  NA  Past/Anticipated interventions by medical oncology, if any: NA  Weight changes, if any:   IPPS: SHIM:  Bowel/Bladder complaints, if  any: {:18581}   Nausea/Vomiting, if any:  {:18581}  Pain issues, if any:  {:18581}  SAFETY ISSUES: Prior radiation? Yes, IMRT Prostate 7,800 cGy in 40 sessions utilizing 6 MV photons in  January 2006.  Gold Seed implantation. Pacemaker/ICD?  Possible current pregnancy? Male Is the patient on methotrexate? No  Current Complaints / other details:

## 2022-05-14 ENCOUNTER — Inpatient Hospital Stay: Payer: Medicare Other | Attending: Oncology | Admitting: Oncology

## 2022-05-14 ENCOUNTER — Other Ambulatory Visit: Payer: Self-pay

## 2022-05-14 ENCOUNTER — Ambulatory Visit
Admission: RE | Admit: 2022-05-14 | Discharge: 2022-05-14 | Disposition: A | Discharge status: Home / Self Care | Payer: Medicare Other | Source: Ambulatory Visit | Attending: Radiation Oncology | Admitting: Radiation Oncology

## 2022-05-14 VITALS — BP 186/93 | HR 55 | Temp 97.6°F | Resp 17 | Ht 61.0 in | Wt 127.8 lb

## 2022-05-14 VITALS — BP 186/93 | HR 55 | Temp 97.6°F | Resp 17 | Wt 127.5 lb

## 2022-05-14 DIAGNOSIS — L910 Hypertrophic scar: Secondary | ICD-10-CM | POA: Insufficient documentation

## 2022-05-14 DIAGNOSIS — Z923 Personal history of irradiation: Secondary | ICD-10-CM | POA: Insufficient documentation

## 2022-05-14 DIAGNOSIS — C7951 Secondary malignant neoplasm of bone: Secondary | ICD-10-CM | POA: Insufficient documentation

## 2022-05-14 DIAGNOSIS — C61 Malignant neoplasm of prostate: Secondary | ICD-10-CM | POA: Insufficient documentation

## 2022-05-14 DIAGNOSIS — Z79899 Other long term (current) drug therapy: Secondary | ICD-10-CM | POA: Diagnosis not present

## 2022-05-14 DIAGNOSIS — Z192 Hormone resistant malignancy status: Secondary | ICD-10-CM

## 2022-05-14 NOTE — Progress Notes (Signed)
Introduced myself to the patient as the prostate nurse navigator.  He is here to discuss his radiation treatment options, at this time no role for radiation is needed.  Patient was also scheduled to have [facility] consult with Dr. Alen Blew today, was unable to make appointment.  Request for rescheduling submitted.  I gave him my business card and asked him to call me with questions or concerns.  Verbalized understanding.  Will follow up once patient see's [facility] as well.

## 2022-05-15 ENCOUNTER — Telehealth: Payer: Self-pay | Admitting: Oncology

## 2022-05-15 NOTE — Telephone Encounter (Signed)
R/s pt's new pt appt. Pt is aware of appt date/time. Mailed updated calendar per pt request.

## 2022-05-15 NOTE — Progress Notes (Signed)
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    ICD-10-CM   1. Malignant neoplasm of prostate ([place])  C61     2. Secondary malignant neoplasm of bone and bone marrow ([facility])  C79.51    C79.52     3. Metastatic castration-resistant adenocarcinoma of prostate [facility])  C61    Z19.2       HISTORY OF PRESENT ILLNESS: Todd Holmes is a 88 y.o. male seen at the request of Dr. Abner Greenspan. In summary, he was initially diagnosed with cT2aN0, Gleason 3+4 prostate cancer, with a PSA of 5.3, in 06/2004 by Dr. Reece Agar. He opted to proceed with IMRT under the care of Dr. Valere Dross in early 2006. His PSA reached a nadir of 0.49 in 09/2006.   His surveillance CT and bone scans remained negative through 01/2018. However, he was found to have a significantly elevated PSA of 67.8 in 10/2018, and CT A/P in 11/2018 showed a prominent right-sided seminal vesicle, and bone scan showed two new osseous lesions in the ribs. He was started on ADT with Lupron through 05/2019, at which time he was lost to follow up.  He returned for follow up in 09/2021, and his PSA was found to be further elevated at 89.2. He underwent restaging CT A/P in 10/2021 showing mildly enlarged pelvic lymph nodes, a new 3.2 cm focus in the superior right sacral ala, and new tiny subcentimeter sclerotic focus in the medial left iliac bone.  A bone scan performed that same day also confirmed uptake in the sternum, left 5th rib, and right sacral ala. He was restarted on ADT with Firmagon on 11/11/21 followed by Eligard on 12/09/21. He was also started on Xtandi in 11/2021. Despite ADT with  escalated therapy, his PSA continued to rise, reaching 184 on 04/16/22.    PREVIOUS RADIATION THERAPY: Yes  09/30/04 - 11/24/04: Prostate / 78 Gy in 40 fractions Valere Dross)  PAST MEDICAL HISTORY:  Past Medical History:  Diagnosis Date   Arthritis    knees   Cancer ([place])    prostate, tx 2005- radiation    Keloid    keloid across chest, pt. unsure of how he encountered it   Memory disorder 11/28/2014   Neuromuscular disorder ([place])    benign- tremor- occas., treated /w nadolol   Tremor 11/28/2014      PAST SURGICAL HISTORY: Past Surgical History:  Procedure Laterality Date   CATARACT EXTRACTION W/PHACO  09/23/2011   Procedure: CATARACT EXTRACTION PHACO AND INTRAOCULAR LENS PLACEMENT (IOC);  Surgeon: Marylynn Pearson, MD;  Location: [place];  Service: Ophthalmology;  Laterality: Right;   EYE SURGERY Bilateral    L cataract removed & IOL   HERNIA REPAIR     bilateral hernia repair, inguinal, 1980's      FAMILY HISTORY:  Family History  Problem Relation Age of Onset   Anesthesia problems Neg Hx    Hypotension Neg Hx    Malignant hyperthermia Neg Hx    Pseudochol deficiency Neg Hx    Cancer Mother    Stroke Father    Lymphoma Brother     SOCIAL HISTORY:  Social History   Socioeconomic History  Marital status: Single    Spouse name: Not on file   Number of children: 0   Years of education: 14   Highest education level: Not on file  Occupational History   Occupation: retired  Tobacco Use   Smoking status: Never   Smokeless tobacco: Never  Substance and Sexual Activity   Alcohol use: No   Drug use: No   Sexual activity: Not on file  Other Topics Concern   Not on file  Social History Narrative   Patient is right handed.   Patient does not drink caffeine.   Social Determinants of Health   Financial Resource Strain: Not on file  Food Insecurity: Not on file  Transportation Needs: Not on file  Physical Activity: Not on file  Stress: Not on file  Social Connections: Not  on file  Intimate Partner Violence: Not on file    ALLERGIES: Patient has no known allergies.  MEDICATIONS:  Current Outpatient Medications  Medication Sig Dispense Refill   escitalopram (LEXAPRO) 10 MG tablet Take 10 mg by mouth daily.     fenofibrate 160 MG tablet Take 160 mg by mouth daily.      LAGEVRIO 200 MG CAPS capsule SMARTSIG:4 Capsule(s) By Mouth Every 12 Hours     metoprolol succinate (TOPROL-XL) 100 MG 24 hr tablet Take 100 mg by mouth daily.     Multiple Vitamins-Minerals (MULTIVITAMINS THER. W/MINERALS) TABS Take 1 tablet by mouth daily.       MYRBETRIQ 25 MG TB24 tablet Take 25 mg by mouth daily.     nadolol (CORGARD) 80 MG tablet Take 80 mg by mouth daily.      No current facility-administered medications for this encounter.    REVIEW OF SYSTEMS:  On review of systems, the patient reports that he is doing well overall. He denies any chest pain, shortness of breath, cough, fevers, chills, night sweats, unintended weight changes. He denies any bowel or bladder disturbances, and denies abdominal pain, nausea or vomiting. He denies any new skeletal or joint aches or pains. A complete review of systems is obtained and is otherwise negative.    PHYSICAL EXAM:  Wt Readings from Last 3 Encounters:  05/14/22 127 lb 12.8 oz (58 kg)  05/14/22 127 lb 8 oz (57.8 kg)  12/22/17 147 lb (66.7 kg)   Temp Readings from Last 3 Encounters:  05/14/22 97.6 F (36.4 C) (Oral)  05/14/22 97.6 F (36.4 C) (Oral)  05/12/15 98 F (36.7 C)   BP Readings from Last 3 Encounters:  05/14/22 (!) 186/93  05/14/22 (!) 186/93  12/22/17 (!) 165/73   Pulse Readings from Last 3 Encounters:  05/14/22 (!) 55  05/14/22 (!) 55  12/22/17 (!) 50   Pain Assessment Pain Score: 0-No pain/10  In general this is a well appearing African-American male in no acute distress. He's alert and oriented x4 and appropriate throughout the examination. Cardiopulmonary assessment is negative for acute distress  and he exhibits normal effort.     KPS = 90  100 - Normal; no complaints; no evidence of disease. 90   - Able to carry on normal activity; minor signs or symptoms of disease. 80   - Normal activity with effort; some signs or symptoms of disease. 7   - Cares for self; unable to carry on normal activity or to do active work. 60   - Requires occasional assistance, but is able to care for most of his personal needs. 50   - Requires considerable assistance  and frequent medical care. 60   - Disabled; requires special care and assistance. 75   - Severely disabled; hospital admission is indicated although death not imminent. 4   - Very sick; hospital admission necessary; active supportive treatment necessary. 10   - Moribund; fatal processes progressing rapidly. 0     - Dead  Karnofsky DA, Abelmann [place], Craver LS and Burchenal [facility] [phone]) The use of the nitrogen mustards in the palliative treatment of carcinoma: with particular reference to bronchogenic carcinoma Cancer 1 634-56  LABORATORY DATA:  Lab Results  Component Value Date   WBC 6.7 09/11/2011   HGB 15.8 09/11/2011   HCT 46.8 09/11/2011   MCV 87.8 09/11/2011   PLT 166 09/11/2011   Lab Results  Component Value Date   NA 143 09/11/2011   K 3.7 09/11/2011   CL 105 09/11/2011   CO2 27 09/11/2011   No results found for: "ALT", "AST", "GGT", "ALKPHOS", "BILITOT"   RADIOGRAPHY: No results found.    IMPRESSION/PLAN: 1. 88 y.o. 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  Today, we talked to the patient about the findings and workup thus far. We discussed the natural history of metastatic castrate resistant prostate cancer and general treatment, highlighting the role of radiotherapy in the management of painful, osseous disease. We discussed the available radiation techniques, and focused on the details and logistics of delivery.  Fortunately, he is not currently having  any pain associated with the osseous metastases.  Therefore, there is not currently a role for palliative radiotherapy but he understands that should he develop any focal painful lesions, we would be more than happy to offer treatment.  He missed a recent scheduled consult visit with Dr. Alen Blew earlier this afternoon but is planning to get that rescheduled for the very near future to discuss further systemic treatment options in the management of his disease.  The patient was encouraged to ask questions that were answered to his stated satisfaction.  We enjoyed meeting him today and look forward to continuing to follow his progress via correspondence.  We personally spent 60 minutes in this encounter including chart review, reviewing radiological studies, meeting face-to-face with the patient, entering orders and completing documentation.    Nicholos Johns, PA-C    Tyler Pita, MD  [facility] Direct Dial: [phone]  Fax: [phone] [email or web address]  Skype  [email or web address]   This document serves as a record of services personally performed by Tyler Pita, MD and Freeman Caldron, PA-C. It was created on their behalf by Wilburn Mylar, a trained medical scribe. The creation of this record is based on the scribe's personal observations and the provider's statements to them. This document has been checked and approved by the attending provider.

## 2022-06-09 NOTE — Progress Notes (Signed)
RN left voicemail with patient to remind him of upcoming MedOnc Consult on 9/27.

## 2022-06-10 ENCOUNTER — Inpatient Hospital Stay: Payer: Medicare Other | Attending: Oncology | Admitting: Oncology

## 2022-06-10 ENCOUNTER — Telehealth: Payer: Self-pay | Admitting: *Deleted

## 2022-06-10 ENCOUNTER — Other Ambulatory Visit: Payer: Self-pay

## 2022-06-10 VITALS — BP 156/62 | HR 73 | Temp 97.7°F | Resp 17 | Ht 61.0 in | Wt 127.9 lb

## 2022-06-10 DIAGNOSIS — C7951 Secondary malignant neoplasm of bone: Secondary | ICD-10-CM

## 2022-06-10 DIAGNOSIS — C61 Malignant neoplasm of prostate: Secondary | ICD-10-CM

## 2022-06-10 DIAGNOSIS — R591 Generalized enlarged lymph nodes: Secondary | ICD-10-CM

## 2022-06-10 DIAGNOSIS — Z192 Hormone resistant malignancy status: Secondary | ICD-10-CM | POA: Diagnosis not present

## 2022-06-10 NOTE — Progress Notes (Signed)
Reason for the request:    Prostate cancer  HPI: I was asked by Dr. Abner Greenspan to evaluate Todd Holmes for the evaluation of prostate cancer.  He is a 88 year old man with prostate cancer dating back to 2005.  He presented with Gleason 3+4 PSA of 5.3.  He opted to proceed with IMRT under the care of Dr. Valere Dross in early 2006. His PSA reached a nadir of 0.49 in 09/2006. He was found to have a significantly elevated PSA of 67.8 in 10/2018, and CT A/P in 11/2018 showed a prominent right-sided seminal vesicle, and bone scan showed two new osseous lesions in the ribs. He was started on ADT with Lupron through 05/2019, at which time he was lost to follow up.   He returned for follow up in 09/2021, and his PSA was found to be further elevated at 89.2. He underwent restaging CT A/P in 10/2021 showing mildly enlarged pelvic lymph nodes, a new 3.2 cm focus in the superior right sacral ala, and new tiny subcentimeter sclerotic focus in the medial left iliac bone.  A bone scan performed that same day also confirmed uptake in the sternum, left 5th rib, and right sacral ala. He was restarted on ADT with Firmagon on 11/11/21 followed by Eligard on 12/09/21. He was also started on Xtandi in 11/2021. Despite ADT with escalated therapy, his PSA continued to rise, reaching 184 on 04/16/22.  He remains on Eligard and Xtandi in the care of Dr. Abner Greenspan.  He was evaluated by Dr. Tammi Klippel and did not feel that the any radiation is warranted at this time unless he has painful metastatic disease.  Clinically, he reports no major complaints.  He lives independently and continues to attend to activities of daily living without any major issues.  He denies any bone pain or pathological fractures.  He does not report any headaches, blurry vision, syncope or seizures. Does not report any fevers, chills or sweats.  Does not report any cough, wheezing or hemoptysis.  Does not report any chest pain, palpitation, orthopnea or leg edema.  Does not report any  nausea, vomiting or abdominal pain.  Does not report any constipation or diarrhea.  Does not report any skeletal complaints.    Does not report frequency, urgency or hematuria.  Does not report any skin rashes or lesions. Does not report any heat or cold intolerance.  Does not report any lymphadenopathy or petechiae.  Does not report any anxiety or depression.  Remaining review of systems is negative.     Past Medical History:  Diagnosis Date   Arthritis    knees   Cancer ([place])    prostate, tx 2005- radiation    Keloid    keloid across chest, pt. unsure of how he encountered it   Memory disorder 11/28/2014   Neuromuscular disorder ([place])    benign- tremor- occas., treated /w nadolol   Tremor 11/28/2014  :   Past Surgical History:  Procedure Laterality Date   CATARACT EXTRACTION W/PHACO  09/23/2011   Procedure: CATARACT EXTRACTION PHACO AND INTRAOCULAR LENS PLACEMENT (IOC);  Surgeon: Marylynn Pearson, MD;  Location: [place];  Service: Ophthalmology;  Laterality: Right;   EYE SURGERY Bilateral    L cataract removed & IOL   HERNIA REPAIR     bilateral hernia repair, inguinal, 1980's    :   Current Outpatient Medications:    escitalopram (LEXAPRO) 10 MG tablet, Take 10 mg by mouth daily., Disp: , Rfl:    fenofibrate 160 MG tablet,  Take 160 mg by mouth daily. , Disp: , Rfl:    LAGEVRIO 200 MG CAPS capsule, SMARTSIG:4 Capsule(s) By Mouth Every 12 Hours, Disp: , Rfl:    metoprolol succinate (TOPROL-XL) 100 MG 24 hr tablet, Take 100 mg by mouth daily., Disp: , Rfl:    Multiple Vitamins-Minerals (MULTIVITAMINS THER. W/MINERALS) TABS, Take 1 tablet by mouth daily.  , Disp: , Rfl:    MYRBETRIQ 25 MG TB24 tablet, Take 25 mg by mouth daily., Disp: , Rfl:    nadolol (CORGARD) 80 MG tablet, Take 80 mg by mouth daily. , Disp: , Rfl: :  No Known Allergies:   Family History  Problem Relation Age of Onset   Anesthesia problems Neg Hx    Hypotension Neg Hx    Malignant hyperthermia Neg Hx     Pseudochol deficiency Neg Hx    Cancer Mother    Stroke Father    Lymphoma Brother   :   Social History   Socioeconomic History   Marital status: Single    Spouse name: Not on file   Number of children: 0   Years of education: 14   Highest education level: Not on file  Occupational History   Occupation: retired  Tobacco Use   Smoking status: Never   Smokeless tobacco: Never  Substance and Sexual Activity   Alcohol use: No   Drug use: No   Sexual activity: Not on file  Other Topics Concern   Not on file  Social History Narrative   Patient is right handed.   Patient does not drink caffeine.   Social Determinants of Health   Financial Resource Strain: Not on file  Food Insecurity: Not on file  Transportation Needs: Not on file  Physical Activity: Not on file  Stress: Not on file  Social Connections: Not on file  Intimate Partner Violence: Not on file  :  Pertinent items are noted in HPI.  Exam: Blood pressure (!) 156/62, pulse 73, temperature 97.7 F (36.5 C), temperature source Temporal, resp. rate 17, height '5\' 1"'$  (1.549 m), weight 127 lb 14.4 oz (58 kg), SpO2 92 %. ECOG 1 General appearance: alert and cooperative appeared without distress. Head: atraumatic without any abnormalities. Eyes: conjunctivae/corneas clear. PERRL.  Sclera anicteric. Throat: lips, mucosa, and tongue normal; without oral thrush or ulcers. Resp: clear to auscultation bilaterally without rhonchi, wheezes or dullness to percussion. Cardio: regular rate and rhythm, S1, S2 normal, no murmur, click, rub or gallop GI: soft, non-tender; bowel sounds normal; no masses,  no organomegaly Skin: Skin color, texture, turgor normal. No rashes or lesions Lymph nodes: Cervical, supraclavicular, and axillary nodes normal. Neurologic: Grossly normal without any motor, sensory or deep tendon reflexes. Musculoskeletal: No joint deformity or effusion.     Assessment and Plan:   88 year old  with   Castration-resistant advanced prostate cancer with disease to the bone and lymphadenopathy documented in 2023.  He initially diagnosed in 2005 with with localized disease.   The natural course of this disease was reviewed at this time and treatment options were discussed.  He appears to have progressed on Xtandi with a rise in his PSA despite androgen deprivation therapy.  Taxotere chemotherapy remains the most effective treatment after progression of androgen receptor pathway inhibitors.  Other option would be PARP inhibitors if he harbors the appropriate mutation.  Continuing Xtandi would be also reasonable if he does not desire any additional or aggressive measures.  At this time, I have recommended updating his staging scan  with a PSMA PET and obtain recent PSA and testosterone level in addition to [place] analysis.  Based on these findings will determine best course of action.  2.  Androgen deprivation therapy: He is to continue this indefinitely currently receiving it under the care of Dr. Abner Greenspan.  3.  Bone directed therapy: He is currently on Xgeva and will continue to receive that on a monthly basis.  4.  Prognosis and goals of care: Any treatment is palliative at this time and his disease is incurable.  5.  Follow-up:  will be in the next 4 to 6 weeks for repeat evaluation after updating his staging and laboratory testing.  60  minutes were dedicated to this visit. The time was spent on reviewing laboratory data, imaging studies, discussing treatment options, and answering questions regarding future plan.      A copy of this consult has been forwarded to the requesting physician.

## 2022-06-10 NOTE — Telephone Encounter (Signed)
PC to patient regarding missed appointment today, no answer, left VM - informed patient scheduling department will contact him to reschedule, scheduling message sent.

## 2022-06-18 NOTE — Progress Notes (Signed)
RN contacted nuclear medicine to schedule PSMA Pet Scan since authorization has been obtained.   Pt scheduled for 10/12 @ 2pm with arrival time of 1:30pm.  Message left for patient to call back to review appointment information/instructions.

## 2022-06-18 NOTE — Progress Notes (Signed)
Pt aware of upcoming PSMA PET scan appointment date/time/instructions.    Provided scheduling contact number in case patient needs to change for any scheduling conflicts.

## 2022-06-25 ENCOUNTER — Other Ambulatory Visit (HOSPITAL_COMMUNITY): Payer: Medicare Other

## 2022-06-26 ENCOUNTER — Encounter (HOSPITAL_COMMUNITY)
Admission: RE | Admit: 2022-06-26 | Discharge: 2022-06-26 | Disposition: A | Discharge status: Home / Self Care | Payer: Medicare Other | Source: Ambulatory Visit | Attending: Oncology | Admitting: Oncology

## 2022-06-26 DIAGNOSIS — C61 Malignant neoplasm of prostate: Secondary | ICD-10-CM | POA: Insufficient documentation

## 2022-06-26 DIAGNOSIS — Z192 Hormone resistant malignancy status: Secondary | ICD-10-CM | POA: Diagnosis present

## 2022-06-26 MED ORDER — PIFLIFOLASTAT F 18 (PYLARIFY) INJECTION
9.0000 | Freq: Once | INTRAVENOUS | Status: AC
  Administered 2022-06-26: 7.4 via INTRAVENOUS

## 2022-06-30 ENCOUNTER — Telehealth: Payer: Self-pay | Admitting: Oncology

## 2022-06-30 NOTE — Telephone Encounter (Signed)
Called patient regarding [place] appiintment

## 2022-07-01 ENCOUNTER — Other Ambulatory Visit: Payer: Medicare Other

## 2022-07-01 ENCOUNTER — Inpatient Hospital Stay: Payer: Medicare Other | Attending: Oncology

## 2022-07-01 ENCOUNTER — Other Ambulatory Visit: Payer: Self-pay

## 2022-07-01 DIAGNOSIS — C7951 Secondary malignant neoplasm of bone: Secondary | ICD-10-CM | POA: Diagnosis not present

## 2022-07-01 DIAGNOSIS — C61 Malignant neoplasm of prostate: Secondary | ICD-10-CM | POA: Diagnosis present

## 2022-07-01 LAB — CMP (CANCER CENTER ONLY)
ALT: 9 U/L (ref 0–44)
AST: 21 U/L (ref 15–41)
Albumin: 3.5 g/dL (ref 3.5–5.0)
Alkaline Phosphatase: 36 U/L — ABNORMAL LOW (ref 38–126)
Anion gap: 5 (ref 5–15)
BUN: 20 mg/dL (ref 8–23)
CO2: 28 mmol/L (ref 22–32)
Calcium: 8.8 mg/dL — ABNORMAL LOW (ref 8.9–10.3)
Chloride: 108 mmol/L (ref 98–111)
Creatinine: 0.93 mg/dL (ref 0.61–1.24)
GFR, Estimated: >60 mL/min (ref >60)
Glucose, Bld: 107 mg/dL — ABNORMAL HIGH (ref 70–99)
Potassium: 4.1 mmol/L (ref 3.5–5.1)
Sodium: 141 mmol/L (ref 135–145)
Total Bilirubin: 0.6 mg/dL (ref 0.3–1.2)
Total Protein: 7.4 g/dL (ref 6.5–8.1)

## 2022-07-01 LAB — CBC WITH DIFFERENTIAL (CANCER CENTER ONLY)
Abs Immature Granulocytes: 0.01 10*3/uL (ref 0.00–0.07)
Basophils Absolute: 0 10*3/uL (ref 0.0–0.1)
Basophils Relative: 1 %
Eosinophils Absolute: 0.1 10*3/uL (ref 0.0–0.5)
Eosinophils Relative: 2 %
HCT: 42 % (ref 39.0–52.0)
Hemoglobin: 13.9 g/dL (ref 13.0–17.0)
Immature Granulocytes: 0 %
Lymphocytes Relative: 24 %
Lymphs Abs: 1 10*3/uL (ref 0.7–4.0)
MCH: 29.1 pg (ref 26.0–34.0)
MCHC: 33.1 g/dL (ref 30.0–36.0)
MCV: 88.1 fL (ref 80.0–100.0)
Monocytes Absolute: 0.4 10*3/uL (ref 0.1–1.0)
Monocytes Relative: 9 %
Neutro Abs: 2.8 10*3/uL (ref 1.7–7.7)
Neutrophils Relative %: 64 %
Platelet Count: 202 10*3/uL (ref 150–400)
RBC: 4.77 MIL/uL (ref 4.22–5.81)
RDW: 14.4 % (ref 11.5–15.5)
WBC Count: 4.4 10*3/uL (ref 4.0–10.5)
nRBC: 0 % (ref 0.0–0.2)

## 2022-07-02 LAB — PROSTATE-SPECIFIC AG, SERUM (LABCORP): Prostate Specific Ag, Serum: 212 ng/mL — ABNORMAL HIGH (ref 0.0–4.0)

## 2022-07-02 LAB — TESTOSTERONE: Testosterone: 7 ng/dL — ABNORMAL LOW (ref 264–916)

## 2022-07-28 ENCOUNTER — Ambulatory Visit: Payer: Medicare Other | Admitting: Oncology

## 2022-08-03 ENCOUNTER — Encounter: Payer: Self-pay | Admitting: Oncology

## 2022-08-12 ENCOUNTER — Other Ambulatory Visit: Payer: Self-pay

## 2022-08-12 ENCOUNTER — Inpatient Hospital Stay: Payer: Medicare Other | Attending: Oncology | Admitting: Oncology

## 2022-08-12 VITALS — BP 160/74 | HR 52 | Temp 97.5°F | Resp 18 | Wt 126.5 lb

## 2022-08-12 DIAGNOSIS — C7951 Secondary malignant neoplasm of bone: Secondary | ICD-10-CM | POA: Insufficient documentation

## 2022-08-12 DIAGNOSIS — C61 Malignant neoplasm of prostate: Secondary | ICD-10-CM | POA: Diagnosis not present

## 2022-08-12 DIAGNOSIS — Z192 Hormone resistant malignancy status: Secondary | ICD-10-CM

## 2022-08-12 DIAGNOSIS — R634 Abnormal weight loss: Secondary | ICD-10-CM | POA: Diagnosis not present

## 2022-08-12 NOTE — Progress Notes (Addendum)
Hematology and Oncology Follow Up Visit  Todd Holmes [id number] 1929/03/13 88 y.o. 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  He presented with localized disease in 2005 with a Gleason score of 3+4 equal 7, PSA 5.3.   Prior Therapy:  He is status post IMRT under the care of Dr. Valere Dross in early 2006. His PSA reached a nadir of 0.49 in 09/2006.   He was found to have a significantly elevated PSA of 67.8 in 10/2018, and CT A/P in 11/2018 showed a prominent right-sided seminal vesicle, and bone scan showed two new osseous lesions in the ribs. He was started on ADT with Lupron through 05/2019, at which time he was lost to follow up.   He returned for follow up in 09/2021, and his PSA was found to be further elevated at 89.2. He underwent restaging CT A/P in 10/2021 showing mildly enlarged pelvic lymph nodes, a new 3.2 cm focus in the superior right sacral ala, and new tiny subcentimeter sclerotic focus in the medial left iliac bone.  A bone scan performed that same day also confirmed uptake in the sternum, left 5th rib, and right sacral ala. He was restarted on ADT with Firmagon on 11/11/21 followed by Eligard on 12/09/21. He was also started on Xtandi in 11/2021. Despite ADT with escalated therapy, his PSA continued to rise, reaching 184 on 04/16/22.   PSMA PET scan obtained on June 26, 2022 confirmed the presence of widespread metastatic disease including bone and lymphadenopathy.  [id number] analysis did not show any actionable mutation.  His PSA is up to 212.   Current therapy:  Eligard under the care of Dr. Abner Greenspan started in March 2023.  Xtandi 160 mg daily started in March 2023.  Interim History: Todd Holmes returns today for a follow-up.  Since last visit, he reports no major changes in his health.  He denies any  excessive bone pain or pathological fractures.  He denies any falls or syncope.  He has reported significant decline in his functionality and some weight loss.  He still able to drive and lives independently.  He does not have any family around him and so far is able to care for his activities of daily living.     Medications: I have reviewed the patient's current medications.  Current Outpatient Medications  Medication Sig Dispense Refill   escitalopram (LEXAPRO) 10 MG tablet Take 10 mg by mouth daily.     fenofibrate 160 MG tablet Take 160 mg by mouth daily.      LAGEVRIO 200 MG CAPS capsule SMARTSIG:4 Capsule(s) By Mouth Every 12 Hours     metoprolol succinate (TOPROL-XL) 100 MG 24 hr tablet Take 100 mg by mouth daily.     Multiple Vitamins-Minerals (MULTIVITAMINS THER. W/MINERALS) TABS Take 1 tablet by mouth daily.       MYRBETRIQ 25 MG TB24 tablet Take 25 mg by mouth daily.     nadolol (CORGARD) 80 MG tablet Take 80 mg by mouth daily.  (Patient not taking: Reported on 06/10/2022)     No current facility-administered medications for this visit.     Allergies: No Known Allergies    Physical Exam: Blood pressure (!) 160/74, pulse (!) 52, temperature (!) 97.5 F (36.4 C), temperature source Temporal, resp. rate 18, weight 126 lb 8 oz (57.4 kg), SpO2 96 %.  ECOG: 1    General  appearance: Comfortable appearing without any discomfort Head: Normocephalic without any trauma Oropharynx: Mucous membranes are moist and pink without any thrush or ulcers. Eyes: Pupils are equal and round reactive to light. Lymph nodes: No cervical, supraclavicular, inguinal or axillary lymphadenopathy.   Heart:regular rate and rhythm.  S1 and S2 without leg edema. Lung: Clear without any rhonchi or wheezes.  No dullness to percussion. Abdomin: Soft, nontender, nondistended with good bowel sounds.  No hepatosplenomegaly. Musculoskeletal: No joint deformity or effusion.  Full range of motion  noted. Neurological: No deficits noted on motor, sensory and deep tendon reflex exam. Skin: No petechial rash or dryness.  Appeared moist.      Lab Results: Lab Results  Component Value Date   WBC 4.4 07/01/2022   HGB 13.9 07/01/2022   HCT 42.0 07/01/2022   MCV 88.1 07/01/2022   PLT 202 07/01/2022     Chemistry      Component Value Date/Time   NA 141 07/01/2022 1527   K 4.1 07/01/2022 1527   CL 108 07/01/2022 1527   CO2 28 07/01/2022 1527   BUN 20 07/01/2022 1527   CREATININE 0.93 07/01/2022 1527      Component Value Date/Time   CALCIUM 8.8 (L) 07/01/2022 1527   ALKPHOS 36 (L) 07/01/2022 1527   AST 21 07/01/2022 1527   ALT 9 07/01/2022 1527   BILITOT 0.6 07/01/2022 1527      Latest Reference Range & Units 07/01/22 15:27  Prostate Specific Ag, Serum 0.0 - 4.0 ng/mL 212.0 (H)  (H): Data is abnormally high   IMPRESSION: 1. Widespread skeletal metastasis which are radiotracer avid involving the axillary and appendicular skeleton. Approximately 40 lesion present. The number of lesions detected is markedly increased from comparison bone scan. Favor progression of skeletal disease rather than solely increased technique sensitivity. 2. Radiotracer avid pelvic lymph nodes and intense radiotracer avid LEFT supraclavicular lymph node consistent metastatic adenopathy above and below the diaphragm. 3. Minimal skeletal change CT portion exam related to the skeletal metastasis.      Impression and Plan:   88 year old with:  Advanced prostate cancer with lymphadenopathy and bone disease documented in March 2023.  He has castration-resistant with a rising PSA despite being on Xtandi and androgen deprivation.  The natural course of this disease was reviewed and treatment options were discussed.  Taxotere chemotherapy remains his most effective option at this time with PARP inhibitor is not an option.  Pluvicto could be an option after Taxotere chemotherapy.  Switching to  different androgen receptor pathway inhibitor would likely be less effective.  Complication associated with chemotherapy include nausea, vomiting, suppression, neutropenia and possible sepsis were reiterated.  Supportive care and transitioning into palliative care approach could also be considered given his advanced malignancy and age.   After discussion today, he is hesitant to consider chemotherapy given the fact that he is living by himself and likely will experience more functional decline associated with chemotherapy he does not have the right derangement to proceed with.  At this time, he is minimally symptomatic and we have opted to continue with the same treatment as he is receiving.  Palliative radiation therapy could be considered and systemic chemotherapy can be revisited in the next few months after his settled situation.  He is considering transitioning into an assisted living facility where he can get some assistance.   2.  Androgen deprivation therapy: I recommended continuing this indefinitely and currently receiving it under the care of Dr. Abner Greenspan.   3.  Bone directed therapy: He is currently on Xgeva which I recommended continuing this under the care of Dr. Abner Greenspan.   4.  Prognosis and goals of care: Therapy remains palliative with his disease is incurable.  Disease progression process was discussed today with him in detail including potential complications including pain weakness and further deterioration.  I urged him to have advanced directives as well as end-of-life planning.  He has a long-term care policy that likely will execute in the near future.   5.  Follow-up: In 3 months for repeat follow-up.   30  minutes were spent on this encounter.  The time was dedicated to updating disease status, treatment choices and outlining future plan of care discussion.      Zola Button, MD 11/29/20233:20 PM

## 2022-08-17 LAB — GUARDANT 360

## 2022-10-01 ENCOUNTER — Ambulatory Visit (INDEPENDENT_AMBULATORY_CARE_PROVIDER_SITE_OTHER): Payer: Medicare Other

## 2022-10-01 ENCOUNTER — Ambulatory Visit (INDEPENDENT_AMBULATORY_CARE_PROVIDER_SITE_OTHER): Payer: Medicare Other | Admitting: Podiatry

## 2022-10-01 ENCOUNTER — Encounter: Payer: Self-pay | Admitting: Podiatry

## 2022-10-01 DIAGNOSIS — M2041 Other hammer toe(s) (acquired), right foot: Secondary | ICD-10-CM

## 2022-10-01 DIAGNOSIS — M79674 Pain in right toe(s): Secondary | ICD-10-CM | POA: Diagnosis not present

## 2022-10-01 DIAGNOSIS — M2042 Other hammer toe(s) (acquired), left foot: Secondary | ICD-10-CM

## 2022-10-01 DIAGNOSIS — M79675 Pain in left toe(s): Secondary | ICD-10-CM

## 2022-10-01 DIAGNOSIS — B351 Tinea unguium: Secondary | ICD-10-CM

## 2022-10-01 NOTE — Progress Notes (Signed)
Subjective:   Patient ID: Todd Holmes, male   DOB: 89 y.o.   MRN: [id number]   HPI Patient states his toes are really bothering him and he has hammertoes and his nails have elongated thickened and they are digging into adjacent digits.  Patient states this has been getting worse and he has tried wider shoes other modalities.  No change health history   ROS      Objective:  Physical Exam  Neuro vas scaler status found to be intact muscle strength was adequate with patient found to have significant structural malalignment lesser digits bilateral with severely thickened dystrophic nailbeds 1-5 both feet painful     Assessment:  Digital deformities of the lesser digits bilateral with severe mycotic nail infection 1-5 both feet     Plan:  H&P reviewed condition and discussed hammertoes do not recommend current surgery due to age deformity but I did recommend different types of shoes mesh materials and cushioning.  Nails were debrided 1-5 both feet no angiogenic bleeding and this will be done on routine basis

## 2022-11-11 ENCOUNTER — Inpatient Hospital Stay (HOSPITAL_BASED_OUTPATIENT_CLINIC_OR_DEPARTMENT_OTHER): Payer: Medicare Other | Admitting: Hematology and Oncology

## 2022-11-11 ENCOUNTER — Inpatient Hospital Stay: Payer: Medicare Other | Attending: Nurse Practitioner

## 2022-11-11 ENCOUNTER — Other Ambulatory Visit: Payer: Self-pay

## 2022-11-11 VITALS — BP 174/92 | HR 54 | Temp 97.9°F | Resp 14 | Wt 128.4 lb

## 2022-11-11 DIAGNOSIS — Z79899 Other long term (current) drug therapy: Secondary | ICD-10-CM | POA: Insufficient documentation

## 2022-11-11 DIAGNOSIS — Z192 Hormone resistant malignancy status: Secondary | ICD-10-CM

## 2022-11-11 DIAGNOSIS — C61 Malignant neoplasm of prostate: Secondary | ICD-10-CM | POA: Insufficient documentation

## 2022-11-11 LAB — CBC WITH DIFFERENTIAL (CANCER CENTER ONLY)
Abs Immature Granulocytes: 0.01 10*3/uL (ref 0.00–0.07)
Basophils Absolute: 0 10*3/uL (ref 0.0–0.1)
Basophils Relative: 1 %
Eosinophils Absolute: 0.1 10*3/uL (ref 0.0–0.5)
Eosinophils Relative: 2 %
HCT: 41.3 % (ref 39.0–52.0)
Hemoglobin: 13.6 g/dL (ref 13.0–17.0)
Immature Granulocytes: 0 %
Lymphocytes Relative: 27 %
Lymphs Abs: 1.1 10*3/uL (ref 0.7–4.0)
MCH: 29.4 pg (ref 26.0–34.0)
MCHC: 32.9 g/dL (ref 30.0–36.0)
MCV: 89.2 fL (ref 80.0–100.0)
Monocytes Absolute: 0.4 10*3/uL (ref 0.1–1.0)
Monocytes Relative: 10 %
Neutro Abs: 2.5 10*3/uL (ref 1.7–7.7)
Neutrophils Relative %: 60 %
Platelet Count: 186 10*3/uL (ref 150–400)
RBC: 4.63 MIL/uL (ref 4.22–5.81)
RDW: 13.8 % (ref 11.5–15.5)
WBC Count: 4.2 10*3/uL (ref 4.0–10.5)
nRBC: 0 % (ref 0.0–0.2)

## 2022-11-11 LAB — CMP (CANCER CENTER ONLY)
ALT: 9 U/L (ref 0–44)
AST: 21 U/L (ref 15–41)
Albumin: 3.7 g/dL (ref 3.5–5.0)
Alkaline Phosphatase: 37 U/L — ABNORMAL LOW (ref 38–126)
Anion gap: 7 (ref 5–15)
BUN: 22 mg/dL (ref 8–23)
CO2: 27 mmol/L (ref 22–32)
Calcium: 8.5 mg/dL — ABNORMAL LOW (ref 8.9–10.3)
Chloride: 105 mmol/L (ref 98–111)
Creatinine: 0.95 mg/dL (ref 0.61–1.24)
GFR, Estimated: >60 mL/min (ref >60)
Glucose, Bld: 92 mg/dL (ref 70–99)
Potassium: 3.7 mmol/L (ref 3.5–5.1)
Sodium: 139 mmol/L (ref 135–145)
Total Bilirubin: 0.5 mg/dL (ref 0.3–1.2)
Total Protein: 7.3 g/dL (ref 6.5–8.1)

## 2022-11-11 NOTE — Progress Notes (Signed)
[facility] [phone]   [phone]  PROGRESS NOTE  Patient Care Team: [name], MD as PCP - General (Internal Medicine) Katheren Puller, RN as Oncology Nurse Navigator  Hematological/Oncological History # Castration-Resistant Advanced Prostate Cancer  11/2021: started eligard and Xtandi 160 mg PO daily.  08/12/2022: last visit with Dr. Alen Blew 11/11/2022: transition care to Dr. Lorenso Courier   Interval History:  Todd Holmes 89 y.o. male with medical history significant for Castration-Resistant Advanced Prostate Cancer  presents for a follow up visit. The patient's last visit was on 08/12/2022 with Dr. Alen Blew. In the interim since the last visit he has continued on Xtandi therapy.   On exam today Todd Holmes reports that he is feeling quite well.  He notes that he is not having any side effects as a result of his enzalutamide and Eligard therapies.  He reports that he is not having any nausea, vomiting, diarrhea, lightheadedness, dizziness, or shortness of breath.  He reports his energy level is "not a whole lot".  He reports that he tends to get out of bed quite late.  He eats about 2 meals per day and has been reportedly losing weight, but based on our records it is stable over the last 6 months.  He notes that he does have some muscle pains in his back likely related to his arthritis.  He takes Tylenol and Bengay for these.  He reports that he almost never forgets his enzalutamide medication.  He does have some lower extremity swelling with the right greater than the left.  He notes he gets his Eligard shots with urology and thinks his last one was a little over 1 month ago.  Otherwise he is at his baseline level of health.  A full 10 point ROS was otherwise negative.  MEDICAL HISTORY:  Past Medical History:  Diagnosis Date   Arthritis    knees   Cancer ([place])    prostate, tx 2005- radiation    Keloid    keloid across chest, pt. unsure of how  he encountered it   Memory disorder 11/28/2014   Neuromuscular disorder ([place])    benign- tremor- occas., treated /w nadolol   Tremor 11/28/2014    SURGICAL HISTORY: Past Surgical History:  Procedure Laterality Date   CATARACT EXTRACTION W/PHACO  09/23/2011   Procedure: CATARACT EXTRACTION PHACO AND INTRAOCULAR LENS PLACEMENT (IOC);  Surgeon: Marylynn Pearson, MD;  Location: [facility];  Service: Ophthalmology;  Laterality: Right;   EYE SURGERY Bilateral    L cataract removed & IOL   HERNIA REPAIR     bilateral hernia repair, inguinal, 1980's      SOCIAL HISTORY: Social History   Socioeconomic History   Marital status: Single    Spouse name: Not on file   Number of children: 0   Years of education: 14   Highest education level: Not on file  Occupational History   Occupation: retired  Tobacco Use   Smoking status: Never   Smokeless tobacco: Never  Substance and Sexual Activity   Alcohol use: No   Drug use: No   Sexual activity: Not on file  Other Topics Concern   Not on file  Social History Narrative   Patient is right handed.   Patient does not drink caffeine.   Social Determinants of Health   Financial Resource Strain: Not on file  Food Insecurity: Not on file  Transportation Needs: Not on file  Physical Activity: Not on file  Stress: Not on  file  Social Connections: Not on file  Intimate Partner Violence: Not on file    FAMILY HISTORY: Family History  Problem Relation Age of Onset   Anesthesia problems Neg Hx    Hypotension Neg Hx    Malignant hyperthermia Neg Hx    Pseudochol deficiency Neg Hx    Cancer Mother    Stroke Father    Lymphoma Brother     ALLERGIES:  has No Known Allergies.  MEDICATIONS:  Current Outpatient Medications  Medication Sig Dispense Refill   enzalutamide (XTANDI) 40 MG tablet Take 160 mg by mouth daily.     escitalopram (LEXAPRO) 10 MG tablet Take 10 mg by mouth daily.     fenofibrate 160 MG tablet Take 160 mg by mouth daily.       LAGEVRIO 200 MG CAPS capsule SMARTSIG:4 Capsule(s) By Mouth Every 12 Hours     metoprolol succinate (TOPROL-XL) 100 MG 24 hr tablet Take 100 mg by mouth daily.     Multiple Vitamins-Minerals (MULTIVITAMINS THER. W/MINERALS) TABS Take 1 tablet by mouth daily.       MYRBETRIQ 25 MG TB24 tablet Take 25 mg by mouth daily.     nadolol (CORGARD) 80 MG tablet Take 80 mg by mouth daily.     No current facility-administered medications for this visit.    REVIEW OF SYSTEMS:   Constitutional: ( - ) fevers, ( - )  chills , ( - ) night sweats Eyes: ( - ) blurriness of vision, ( - ) double vision, ( - ) watery eyes Ears, nose, mouth, throat, and face: ( - ) mucositis, ( - ) sore throat Respiratory: ( - ) cough, ( - ) dyspnea, ( - ) wheezes Cardiovascular: ( - ) palpitation, ( - ) chest discomfort, ( - ) lower extremity swelling Gastrointestinal:  ( - ) nausea, ( - ) heartburn, ( - ) change in bowel habits Skin: ( - ) abnormal skin rashes Lymphatics: ( - ) new lymphadenopathy, ( - ) easy bruising Neurological: ( - ) numbness, ( - ) tingling, ( - ) new weaknesses Behavioral/Psych: ( - ) mood change, ( - ) new changes  All other systems were reviewed with the patient and are negative.  PHYSICAL EXAMINATION: ECOG PERFORMANCE STATUS: 0 - Asymptomatic  Vitals:   11/11/22 1426  BP: (!) 174/92  Pulse: (!) 54  Resp: 14  Temp: 97.9 F (36.6 C)   Filed Weights   11/11/22 1426  Weight: 128 lb 6.4 oz (58.2 kg)    GENERAL: well appearing elderly male alert, no distress and comfortable SKIN: skin color, texture, turgor are normal, no rashes or significant lesions EYES: conjunctiva are pink and non-injected, sclera clear LUNGS: clear to auscultation and percussion with normal breathing effort HEART: regular rate & rhythm and no murmurs and no lower extremity edema Musculoskeletal: no cyanosis of digits and no clubbing  PSYCH: alert & oriented x 3, fluent speech NEURO: no focal motor/sensory  deficits  LABORATORY DATA:  I have reviewed the data as listed    Latest Ref Rng & Units 11/11/2022    2:04 PM 07/01/2022    3:27 PM 09/11/2011    1:59 PM  CBC  WBC 4.0 - 10.5 K/uL 4.2  4.4  6.7   Hemoglobin 13.0 - 17.0 g/dL 13.6  13.9  15.8   Hematocrit 39.0 - 52.0 % 41.3  42.0  46.8   Platelets 150 - 400 K/uL 186  202  166  Latest Ref Rng & Units 11/11/2022    2:04 PM 07/01/2022    3:27 PM 09/11/2011    1:59 PM  CMP  Glucose 70 - 99 mg/dL 92  107  109   BUN 8 - 23 mg/dL '22  20  14   '$ Creatinine 0.61 - 1.24 mg/dL 0.95  0.93  0.93   Sodium 135 - 145 mmol/L 139  141  143   Potassium 3.5 - 5.1 mmol/L 3.7  4.1  3.7   Chloride 98 - 111 mmol/L 105  108  105   CO2 22 - 32 mmol/L '27  28  27   '$ Calcium 8.9 - 10.3 mg/dL 8.5  8.8  9.5   Total Protein 6.5 - 8.1 g/dL 7.3  7.4    Total Bilirubin 0.3 - 1.2 mg/dL 0.5  0.6    Alkaline Phos 38 - 126 U/L 37  36    AST 15 - 41 U/L 21  21    ALT 0 - 44 U/L 9  9      RADIOGRAPHIC STUDIES: No results found.  ASSESSMENT & PLAN Todd Holmes 89 y.o. male with medical history significant for Castration-Resistant Advanced Prostate Cancer  presents for a follow up visit.   # Castration-Resistant Advanced Prostate Cancer  -- Continue Eligard therapy with urology. -- Continue enzalutamide 160 mg p.o. daily.  Patient is tolerating this well -- Last PSA was 212 on 07/02/2023. Testosterone was at Castrate levels. This was repeated today.  -- No need for imaging unless patient were found to have rising PSA --Labs show white blood cell count 4.2, hemoglobin 13.6, MCV 89.2, and platelets of 186 -- Return to clinic in 3 months time or sooner if indicated by above labs.  No orders of the defined types were placed in this encounter.   All questions were answered. The patient knows to call the clinic with any problems, questions or concerns.  A total of more than 40 minutes were spent on this encounter with face-to-face time and  non-face-to-face time, including preparing to see the patient, ordering tests and/or medications, counseling the patient and coordination of care as outlined above.   Ledell Peoples, MD Department of Hematology/Oncology [facility] at [facility] Phone: [phone] Pager: [phone] Email: [email or web address]  11/15/2022 7:53 PM

## 2022-11-12 ENCOUNTER — Telehealth: Payer: Self-pay | Admitting: Internal Medicine

## 2022-11-12 NOTE — Telephone Encounter (Signed)
Per 2/28 LOS reached out to schedule patient. Patient aware of date and time of appointment.

## 2022-11-13 LAB — TESTOSTERONE: Testosterone: <3 ng/dL — ABNORMAL LOW (ref 264–916)

## 2022-11-13 LAB — PROSTATE-SPECIFIC AG, SERUM (LABCORP): Prostate Specific Ag, Serum: 468 ng/mL — ABNORMAL HIGH (ref 0.0–4.0)

## 2022-11-16 ENCOUNTER — Telehealth: Payer: Self-pay

## 2022-11-16 ENCOUNTER — Telehealth: Payer: Self-pay | Admitting: Internal Medicine

## 2022-11-16 NOTE — Telephone Encounter (Signed)
Per 3/4 IB  scheduled patient, patient aware of date and time of appointment.

## 2022-11-16 NOTE — Telephone Encounter (Addendum)
Spoke with patient to advise of message below.   ----- Message from Orson Slick, MD sent at 11/15/2022  7:54 PM EST ----- Please let Mr. Fechter know that his PSA has risen in the interval since our last visit. It is currently at 468.  I have placed a request for him to undergo a PSMA scan.  After the scan is complete we will see him back in clinic to discuss options moving forward.  In the meantime recommend he continue taking his enzalutamide therapy.

## 2022-12-07 ENCOUNTER — Inpatient Hospital Stay: Payer: Medicare Other

## 2022-12-07 ENCOUNTER — Inpatient Hospital Stay: Payer: Medicare Other | Admitting: Hematology and Oncology

## 2022-12-18 ENCOUNTER — Inpatient Hospital Stay (HOSPITAL_BASED_OUTPATIENT_CLINIC_OR_DEPARTMENT_OTHER): Payer: Medicare Other | Admitting: Hematology and Oncology

## 2022-12-18 ENCOUNTER — Other Ambulatory Visit: Payer: Self-pay | Admitting: Hematology and Oncology

## 2022-12-18 ENCOUNTER — Inpatient Hospital Stay: Payer: Medicare Other | Attending: Nurse Practitioner

## 2022-12-18 VITALS — BP 146/76 | HR 50 | Temp 97.9°F | Resp 16 | Wt 127.7 lb

## 2022-12-18 DIAGNOSIS — C61 Malignant neoplasm of prostate: Secondary | ICD-10-CM | POA: Diagnosis present

## 2022-12-18 DIAGNOSIS — Z79899 Other long term (current) drug therapy: Secondary | ICD-10-CM | POA: Insufficient documentation

## 2022-12-18 DIAGNOSIS — Z192 Hormone resistant malignancy status: Secondary | ICD-10-CM

## 2022-12-18 LAB — CBC WITH DIFFERENTIAL (CANCER CENTER ONLY)
Abs Immature Granulocytes: 0.01 10*3/uL (ref 0.00–0.07)
Basophils Absolute: 0 10*3/uL (ref 0.0–0.1)
Basophils Relative: 1 %
Eosinophils Absolute: 0.1 10*3/uL (ref 0.0–0.5)
Eosinophils Relative: 3 %
HCT: 40.6 % (ref 39.0–52.0)
Hemoglobin: 13.4 g/dL (ref 13.0–17.0)
Immature Granulocytes: 0 %
Lymphocytes Relative: 30 %
Lymphs Abs: 1.3 10*3/uL (ref 0.7–4.0)
MCH: 29.4 pg (ref 26.0–34.0)
MCHC: 33 g/dL (ref 30.0–36.0)
MCV: 89 fL (ref 80.0–100.0)
Monocytes Absolute: 0.5 10*3/uL (ref 0.1–1.0)
Monocytes Relative: 11 %
Neutro Abs: 2.4 10*3/uL (ref 1.7–7.7)
Neutrophils Relative %: 55 %
Platelet Count: 177 10*3/uL (ref 150–400)
RBC: 4.56 MIL/uL (ref 4.22–5.81)
RDW: 14.1 % (ref 11.5–15.5)
WBC Count: 4.2 10*3/uL (ref 4.0–10.5)
nRBC: 0 % (ref 0.0–0.2)

## 2022-12-18 LAB — CMP (CANCER CENTER ONLY)
ALT: 6 U/L (ref 0–44)
AST: 17 U/L (ref 15–41)
Albumin: 3.8 g/dL (ref 3.5–5.0)
Alkaline Phosphatase: 38 U/L (ref 38–126)
Anion gap: 6 (ref 5–15)
BUN: 21 mg/dL (ref 8–23)
CO2: 29 mmol/L (ref 22–32)
Calcium: 8.8 mg/dL — ABNORMAL LOW (ref 8.9–10.3)
Chloride: 105 mmol/L (ref 98–111)
Creatinine: 0.89 mg/dL (ref 0.61–1.24)
GFR, Estimated: >60 mL/min (ref >60)
Glucose, Bld: 104 mg/dL — ABNORMAL HIGH (ref 70–99)
Potassium: 3.7 mmol/L (ref 3.5–5.1)
Sodium: 140 mmol/L (ref 135–145)
Total Bilirubin: 0.8 mg/dL (ref 0.3–1.2)
Total Protein: 7 g/dL (ref 6.5–8.1)

## 2022-12-18 NOTE — Progress Notes (Signed)
[facility] [phone]   [phone]  PROGRESS NOTE  Patient Care Team: Lorenda Ishihara, MD as PCP - General (Internal Medicine) Cherlyn Cushing, RN as Oncology Nurse Navigator  Hematological/Oncological History # Castration-Resistant Advanced Prostate Cancer  11/2021: started eligard and Xtandi 160 mg PO daily.  08/12/2022: last visit with Dr. Clelia Croft 11/11/2022: transition care to Dr. Leonides Schanz   Interval History:  Todd Holmes 89 y.o. male with medical history significant for Castration-Resistant Advanced Prostate Cancer  presents for a follow up visit. The patient's last visit was on 10/22/2022. In the interim since the last visit he has continued on Xtandi therapy.   On exam today Todd Holmes reports that he is feeling quite well.  He notes he has been intentionally losing weight and is only eating about 2 meals per day.  He reports he eats a midday breakfast and ate dinner.  He reports he does have some occasional pain in his back and has been using a body pillow to try to help with this.  He is taking his enzalutamide and tolerating it quite well without any difficulty.  He reports that he does rarely miss a dose, typically if he forgets the time and it is too late to take the medication.  Overall he reports that his "energy and ambition are low".  He is otherwise stable and denies any fevers, chills, sweats, nausea, vomiting or diarrhea.  Full 10 point ROS is otherwise negative.    Today we discussed the rise in his PSA and the likelihood that his disease is progressing on enzalutamide therapy.  I therefore recommend that we transition to abiraterone therapy.  He was agreeable to this transition.  MEDICAL HISTORY:  Past Medical History:  Diagnosis Date   Arthritis    knees   Cancer (HCC)    prostate, tx 2005- radiation    Keloid    keloid across chest, pt. unsure of how he encountered it   Memory disorder 11/28/2014   Neuromuscular disorder  (HCC)    benign- tremor- occas., treated /w nadolol   Tremor 11/28/2014    SURGICAL HISTORY: Past Surgical History:  Procedure Laterality Date   CATARACT EXTRACTION W/PHACO  09/23/2011   Procedure: CATARACT EXTRACTION PHACO AND INTRAOCULAR LENS PLACEMENT (IOC);  Surgeon: Chalmers Guest, MD;  Location: [facility] OR;  Service: Ophthalmology;  Laterality: Right;   EYE SURGERY Bilateral    L cataract removed & IOL   HERNIA REPAIR     bilateral hernia repair, inguinal, 1980's      SOCIAL HISTORY: Social History   Socioeconomic History   Marital status: Single    Spouse name: Not on file   Number of children: 0   Years of education: 14   Highest education level: Not on file  Occupational History   Occupation: retired  Tobacco Use   Smoking status: Never   Smokeless tobacco: Never  Substance and Sexual Activity   Alcohol use: No   Drug use: No   Sexual activity: Not on file  Other Topics Concern   Not on file  Social History Narrative   Patient is right handed.   Patient does not drink caffeine.   Social Determinants of Health   Financial Resource Strain: Not on file  Food Insecurity: Not on file  Transportation Needs: Not on file  Physical Activity: Not on file  Stress: Not on file  Social Connections: Not on file  Intimate Partner Violence: Not on file    FAMILY HISTORY:  Family History  Problem Relation Age of Onset   Anesthesia problems Neg Hx    Hypotension Neg Hx    Malignant hyperthermia Neg Hx    Pseudochol deficiency Neg Hx    Cancer Mother    Stroke Father    Lymphoma Brother     ALLERGIES:  has No Known Allergies.  MEDICATIONS:  Current Outpatient Medications  Medication Sig Dispense Refill   abiraterone acetate (ZYTIGA) 250 MG tablet Take 4 tablets (1,000 mg total) by mouth daily. Take on an empty stomach 1 hour before or 2 hours after a meal 120 tablet 0   predniSONE (DELTASONE) 5 MG tablet Take 1 tablet (5 mg total) by mouth daily with breakfast. 90  tablet 1   enzalutamide (XTANDI) 40 MG tablet Take 160 mg by mouth daily.     escitalopram (LEXAPRO) 10 MG tablet Take 10 mg by mouth daily.     fenofibrate 160 MG tablet Take 160 mg by mouth daily.      LAGEVRIO 200 MG CAPS capsule SMARTSIG:4 Capsule(s) By Mouth Every 12 Hours     metoprolol succinate (TOPROL-XL) 100 MG 24 hr tablet Take 100 mg by mouth daily.     Multiple Vitamins-Minerals (MULTIVITAMINS THER. W/MINERALS) TABS Take 1 tablet by mouth daily.       MYRBETRIQ 25 MG TB24 tablet Take 25 mg by mouth daily.     nadolol (CORGARD) 80 MG tablet Take 80 mg by mouth daily.     No current facility-administered medications for this visit.    REVIEW OF SYSTEMS:   Constitutional: ( - ) fevers, ( - )  chills , ( - ) night sweats Eyes: ( - ) blurriness of vision, ( - ) double vision, ( - ) watery eyes Ears, nose, mouth, throat, and face: ( - ) mucositis, ( - ) sore throat Respiratory: ( - ) cough, ( - ) dyspnea, ( - ) wheezes Cardiovascular: ( - ) palpitation, ( - ) chest discomfort, ( - ) lower extremity swelling Gastrointestinal:  ( - ) nausea, ( - ) heartburn, ( - ) change in bowel habits Skin: ( - ) abnormal skin rashes Lymphatics: ( - ) new lymphadenopathy, ( - ) easy bruising Neurological: ( - ) numbness, ( - ) tingling, ( - ) new weaknesses Behavioral/Psych: ( - ) mood change, ( - ) new changes  All other systems were reviewed with the patient and are negative.  PHYSICAL EXAMINATION: ECOG PERFORMANCE STATUS: 0 - Asymptomatic  Vitals:   12/18/22 1522  BP: (!) 146/76  Pulse: (!) 50  Resp: 16  Temp: 97.9 F (36.6 C)  SpO2: 97%   Filed Weights   12/18/22 1522  Weight: 127 lb 11.2 oz (57.9 kg)    GENERAL: well appearing elderly male alert, no distress and comfortable SKIN: skin color, texture, turgor are normal, no rashes or significant lesions EYES: conjunctiva are pink and non-injected, sclera clear LUNGS: clear to auscultation and percussion with normal breathing  effort HEART: regular rate & rhythm and no murmurs and no lower extremity edema Musculoskeletal: no cyanosis of digits and no clubbing  PSYCH: alert & oriented x 3, fluent speech NEURO: no focal motor/sensory deficits  LABORATORY DATA:  I have reviewed the data as listed    Latest Ref Rng & Units 12/18/2022    2:54 PM 11/11/2022    2:04 PM 07/01/2022    3:27 PM  CBC  WBC 4.0 - 10.5 K/uL 4.2  4.2  4.4  Hemoglobin 13.0 - 17.0 g/dL 16.1  09.6  04.5   Hematocrit 39.0 - 52.0 % 40.6  41.3  42.0   Platelets 150 - 400 K/uL 177  186  202        Latest Ref Rng & Units 12/18/2022    2:54 PM 11/11/2022    2:04 PM 07/01/2022    3:27 PM  CMP  Glucose 70 - 99 mg/dL 409  92  811   BUN 8 - 23 mg/dL Creatinine 0.61 - 1.24 mg/dL 9.14  7.82  9.56   Sodium 135 - 145 mmol/L 140  139  141   Potassium 3.5 - 5.1 mmol/L 3.7  3.7  4.1   Chloride 98 - 111 mmol/L 105  105  108   CO2 22 - 32 mmol/L Calcium 8.9 - 10.3 mg/dL 8.8  8.5  8.8   Total Protein 6.5 - 8.1 g/dL 7.0  7.3  7.4   Total Bilirubin 0.3 - 1.2 mg/dL 0.8  0.5  0.6   Alkaline Phos 38 - 126 U/L 38  37  36   AST 15 - 41 U/L ALT 0 - 44 U/L RADIOGRAPHIC STUDIES: No results found.  ASSESSMENT & PLAN Todd Holmes 89 y.o. male with medical history significant for Castration-Resistant Advanced Prostate Cancer  presents for a follow up visit.   # Castration-Resistant Advanced Prostate Cancer  -- Continue Eligard therapy with urology. -- Continue enzalutamide 160 mg p.o. daily until his abiraterone arrives at which time he can transition over. --Due to progression on enzalutamide will prescribe abiraterone 1000 mg p.o. daily with prednisone 5 mg p.o. daily -- Last PSA was 468, increase from 212 on 07/02/2023. Testosterone was at Castrate levels. This was repeated today.  -- Recommend repeat PSMA testing.  The order is in. --Labs show white blood cell count 4.2, hemoglobin 13.4, MCV 89, and  platelets of 177 -- Return to clinic in 3 months time or sooner if indicated by above labs.  No orders of the defined types were placed in this encounter.   All questions were answered. The patient knows to call the clinic with any problems, questions or concerns.  A total of more than 30 minutes were spent on this encounter with face-to-face time and non-face-to-face time, including preparing to see the patient, ordering tests and/or medications, counseling the patient and coordination of care as outlined above.   Ulysees Barns, MD Department of Hematology/Oncology [facility] at [facility] Phone: [phone] Pager: [phone] Email: [email or web address]  12/27/2022 5:38 PM

## 2022-12-19 LAB — TESTOSTERONE: Testosterone: 14 ng/dL — ABNORMAL LOW (ref 264–916)

## 2022-12-19 LAB — PROSTATE-SPECIFIC AG, SERUM (LABCORP): Prostate Specific Ag, Serum: 514 ng/mL — ABNORMAL HIGH (ref 0.0–4.0)

## 2022-12-27 ENCOUNTER — Other Ambulatory Visit (HOSPITAL_COMMUNITY): Payer: Self-pay

## 2022-12-27 MED ORDER — ABIRATERONE ACETATE 250 MG PO TABS
1000.0000 mg | ORAL_TABLET | Freq: Every day | ORAL | 0 refills | Status: DC
  Filled 2022-12-27 – 2022-12-28 (×2): qty 120, 30d supply, fill #0

## 2022-12-27 MED ORDER — PREDNISONE 5 MG PO TABS
5.0000 mg | ORAL_TABLET | Freq: Every day | ORAL | 1 refills | Status: DC
  Filled 2022-12-27 – 2022-12-28 (×2): qty 90, 90d supply, fill #0

## 2022-12-28 ENCOUNTER — Telehealth: Payer: Self-pay | Admitting: Pharmacy Technician

## 2022-12-28 ENCOUNTER — Telehealth: Payer: Self-pay | Admitting: Pharmacist

## 2022-12-28 ENCOUNTER — Other Ambulatory Visit: Payer: Self-pay

## 2022-12-28 ENCOUNTER — Other Ambulatory Visit (HOSPITAL_COMMUNITY): Payer: Self-pay

## 2022-12-28 DIAGNOSIS — I10 Essential (primary) hypertension: Secondary | ICD-10-CM

## 2022-12-28 NOTE — Telephone Encounter (Signed)
Oral Oncology Patient Advocate Encounter   Was successful in securing patient an $3,250 grant from [employer] [facility]) to provide copayment coverage for Abiraterone.  This will keep the out of pocket expense at $0.     I have spoken with the patient.    The billing information is as follows and has been shared with [facility].   Member ID: [id number] Group ID: [id number] RxBin: [id number] Dates of Eligibility: 12/28/22 through 12/26/23  Fund:  Prostate  Jinger Neighbors, CPhT-Adv Oncology Pharmacy Patient Advocate [facility] Direct Number: [phone]  Fax: [phone]

## 2022-12-28 NOTE — Telephone Encounter (Signed)
Oral Oncology Patient Advocate Encounter  Prior Authorization for Abiraterone has been approved.    PA# [id number] Effective dates: 12/28/22 through 09/14/23  Patients co-pay is $125.    Jinger Neighbors, CPhT-Adv Oncology Pharmacy Patient Advocate [facility] Direct Number: [phone]  Fax: [phone]

## 2022-12-28 NOTE — Telephone Encounter (Signed)
Oral Chemotherapy Pharmacist Encounter  I spoke with patient for overview of: Zytiga (abiraterone) for the treatment of metastatic castration resistant prostate cancer in conjunction with prednisone and ADT, planned duration until disease progression or unacceptable drug toxicity.   Counseled patient on administration, dosing, side effects, monitoring, drug-food interactions, safe handling, storage, and disposal.  Patient will take Zytiga 250mg  tablets, 4 tablets (1000mg ) by mouth once daily on an empty stomach, 1 hour before or 2 hours after a meal.  Patient will take prednisone 5mg  tablet, 1 tablet by mouth one daily with breakfast.  Patient knows to avoid grapefruit and grapefruit juice while he's on Zytiga.  Zytiga start date: 12/31/22  Adverse effects include but are not limited to: peripheral edema, GI upset, hypertension, hot flashes, fatigue, and arthralgias.    Reviewed with patient importance of keeping a medication schedule and plan for any missed doses. No barriers to medication adherence identified.  Medication reconciliation performed and medication/allergy list updated. Discussed drug-interaction between his Zytiga and Toprol XL - patient is aware to monitor for low blood pressure and heart rate.   All questions answered.  Todd Holmes voiced understanding and appreciation.   Medication education handout placed in mail for patient. Patient knows to call the office with questions or concerns. Oral Chemotherapy Clinic phone number provided to patient.   Lenord Carbo, PharmD, BCPS, [facility] Hematology/Oncology Clinical Pharmacist [facility] and [facility] Oral Chemotherapy Navigation Clinics [phone] 12/28/2022 1:31 PM

## 2022-12-28 NOTE — Telephone Encounter (Signed)
Oral Oncology Pharmacist Encounter  Received new prescription for Zytiga (abiraterone) for the treatment of metastatic castration resistant prostate cancer in conjunction with prednisone and ADT, planned duration until disease progression or unacceptable drug toxicity.  CBC w/ Diff and CMP from 12/18/22 assessed, no baseline renal or hepatic dose adjustments required at this time. Patient PSA 514 ng/mL (up from 468 ng/mL prior)  Current medication list in Epic reviewed, DDIs with Zytiga identified: Category C drug-drug interaction between Zytiga and Toprol-XL - Zytiga, a moderate CYP2D6 inhibitor, may increase serum concentrations of Toprol XL, thus increase risk for side effects (hypotension, bradycardia, etc.) Recommend monitoring for increased side effects while on both agents.  Evaluated chart and no patient barriers to medication adherence noted.   Prescription has been e-scribed to the [facility] for benefits analysis and approval.  Oral Oncology Clinic will continue to follow for insurance authorization, copayment issues, initial counseling and start date.  Lenord Carbo, PharmD, BCPS, [facility] Hematology/Oncology Clinical Pharmacist [facility] and [facility] Oral Chemotherapy Navigation Clinics [phone] 12/28/2022 8:53 AM

## 2022-12-28 NOTE — Telephone Encounter (Signed)
Oral Oncology Patient Advocate Encounter   Received notification that prior authorization for Abiraterone is required.   PA submitted on 12/28/22 Key [id number] Status is pending     Jinger Neighbors, CPhT-Adv Oncology Pharmacy Patient Advocate [facility] Direct Number: [phone]  Fax: [phone]

## 2023-01-04 ENCOUNTER — Other Ambulatory Visit (HOSPITAL_COMMUNITY): Payer: Self-pay

## 2023-01-07 ENCOUNTER — Other Ambulatory Visit (HOSPITAL_COMMUNITY): Payer: Self-pay

## 2023-01-18 ENCOUNTER — Other Ambulatory Visit: Payer: Self-pay | Admitting: Hematology and Oncology

## 2023-01-18 ENCOUNTER — Other Ambulatory Visit (HOSPITAL_COMMUNITY): Payer: Self-pay

## 2023-01-18 ENCOUNTER — Other Ambulatory Visit: Payer: Self-pay

## 2023-01-18 MED ORDER — ABIRATERONE ACETATE 250 MG PO TABS
1000.0000 mg | ORAL_TABLET | Freq: Every day | ORAL | 0 refills | Status: DC
  Filled 2023-01-18: qty 120, 30d supply, fill #0

## 2023-01-26 ENCOUNTER — Other Ambulatory Visit (HOSPITAL_COMMUNITY): Payer: Self-pay

## 2023-02-03 ENCOUNTER — Ambulatory Visit: Payer: Medicare Other | Admitting: Podiatry

## 2023-02-11 ENCOUNTER — Other Ambulatory Visit: Payer: Self-pay | Admitting: Hematology and Oncology

## 2023-02-11 ENCOUNTER — Inpatient Hospital Stay: Payer: Medicare Other | Attending: Nurse Practitioner

## 2023-02-11 ENCOUNTER — Other Ambulatory Visit (HOSPITAL_COMMUNITY): Payer: Self-pay

## 2023-02-11 ENCOUNTER — Inpatient Hospital Stay: Payer: Medicare Other | Admitting: Hematology and Oncology

## 2023-02-11 VITALS — BP 155/81 | HR 53 | Temp 98.1°F | Resp 16 | Wt 126.0 lb

## 2023-02-11 DIAGNOSIS — C61 Malignant neoplasm of prostate: Secondary | ICD-10-CM

## 2023-02-11 DIAGNOSIS — Z192 Hormone resistant malignancy status: Secondary | ICD-10-CM | POA: Diagnosis not present

## 2023-02-11 DIAGNOSIS — Z79899 Other long term (current) drug therapy: Secondary | ICD-10-CM | POA: Insufficient documentation

## 2023-02-11 LAB — CMP (CANCER CENTER ONLY)
ALT: 7 U/L (ref 0–44)
AST: 18 U/L (ref 15–41)
Albumin: 3.8 g/dL (ref 3.5–5.0)
Alkaline Phosphatase: 39 U/L (ref 38–126)
Anion gap: 6 (ref 5–15)
BUN: 27 mg/dL — ABNORMAL HIGH (ref 8–23)
CO2: 29 mmol/L (ref 22–32)
Calcium: 9 mg/dL (ref 8.9–10.3)
Chloride: 108 mmol/L (ref 98–111)
Creatinine: 0.99 mg/dL (ref 0.61–1.24)
GFR, Estimated: >60 mL/min (ref >60)
Glucose, Bld: 118 mg/dL — ABNORMAL HIGH (ref 70–99)
Potassium: 3.6 mmol/L (ref 3.5–5.1)
Sodium: 143 mmol/L (ref 135–145)
Total Bilirubin: 0.7 mg/dL (ref 0.3–1.2)
Total Protein: 7.1 g/dL (ref 6.5–8.1)

## 2023-02-11 LAB — CBC WITH DIFFERENTIAL (CANCER CENTER ONLY)
Abs Immature Granulocytes: 0.01 10*3/uL (ref 0.00–0.07)
Basophils Absolute: 0.1 10*3/uL (ref 0.0–0.1)
Basophils Relative: 1 %
Eosinophils Absolute: 0.1 10*3/uL (ref 0.0–0.5)
Eosinophils Relative: 3 %
HCT: 40.1 % (ref 39.0–52.0)
Hemoglobin: 13 g/dL (ref 13.0–17.0)
Immature Granulocytes: 0 %
Lymphocytes Relative: 29 %
Lymphs Abs: 1.4 10*3/uL (ref 0.7–4.0)
MCH: 29.1 pg (ref 26.0–34.0)
MCHC: 32.4 g/dL (ref 30.0–36.0)
MCV: 89.7 fL (ref 80.0–100.0)
Monocytes Absolute: 0.5 10*3/uL (ref 0.1–1.0)
Monocytes Relative: 10 %
Neutro Abs: 2.7 10*3/uL (ref 1.7–7.7)
Neutrophils Relative %: 57 %
Platelet Count: 185 10*3/uL (ref 150–400)
RBC: 4.47 MIL/uL (ref 4.22–5.81)
RDW: 13.9 % (ref 11.5–15.5)
WBC Count: 4.8 10*3/uL (ref 4.0–10.5)
nRBC: 0 % (ref 0.0–0.2)

## 2023-02-11 NOTE — Progress Notes (Signed)
[facility] Telephone:([phone]   [phone]  PROGRESS NOTE  Patient Care Team: Lorenda Ishihara, MD as PCP - General (Internal Medicine) Cherlyn Cushing, RN as Oncology Nurse Navigator  Hematological/Oncological History # Castration-Resistant Advanced Prostate Cancer  11/2021: started eligard and Xtandi 160 mg PO daily.  08/12/2022: last visit with Dr. Clelia Croft 11/11/2022: transition care to Dr. Leonides Schanz  01/20/2022: approximate start date of Zytiga 1000 mg PO daily with prednisone 5 mg  Interval History:  Todd Holmes 89 y.o. male with medical history significant for Castration-Resistant Advanced Prostate Cancer  presents for a follow up visit. The patient's last visit was on 12/18/2022. In the interim since the last visit he has transitioned to [facility] therapy.    On exam today Todd Holmes reports he began taking the Zytiga with steroids approximately 3 weeks ago.  He reports he rarely misses a dose.  He notes he has not noticed any side effects while taking this medication.  He did have a PSMA scan scheduled but he has not yet made the appointment to have this performed.  He reports that he typically does not sleep more than 2 hours per night.  He does not go to bed until late.  He notes that his energy levels are "not great".  His appetite is good and his weight is currently stable.  He is otherwise stable and denies any fevers, chills, sweats, nausea, vomiting or diarrhea.  Full 10 point ROS is otherwise negative.    Previously we discussed the rise in his PSA and the likelihood that his disease is progressing on enzalutamide therapy.  I therefore recommended that we transition to abiraterone therapy.  He was agreeable to this transition.  MEDICAL HISTORY:  Past Medical History:  Diagnosis Date   Arthritis    knees   Cancer (HCC)    prostate, tx 2005- radiation    Keloid    keloid across chest, pt. unsure of how he encountered it   Memory disorder  11/28/2014   Neuromuscular disorder (HCC)    benign- tremor- occas., treated /w nadolol   Tremor 11/28/2014    SURGICAL HISTORY: Past Surgical History:  Procedure Laterality Date   CATARACT EXTRACTION W/PHACO  09/23/2011   Procedure: CATARACT EXTRACTION PHACO AND INTRAOCULAR LENS PLACEMENT (IOC);  Surgeon: Chalmers Guest, MD;  Location: [facility] OR;  Service: Ophthalmology;  Laterality: Right;   EYE SURGERY Bilateral    L cataract removed & IOL   HERNIA REPAIR     bilateral hernia repair, inguinal, 1980's      SOCIAL HISTORY: Social History   Socioeconomic History   Marital status: Single    Spouse name: Not on file   Number of children: 0   Years of education: 14   Highest education level: Not on file  Occupational History   Occupation: retired  Tobacco Use   Smoking status: Never   Smokeless tobacco: Never  Substance and Sexual Activity   Alcohol use: No   Drug use: No   Sexual activity: Not on file  Other Topics Concern   Not on file  Social History Narrative   Patient is right handed.   Patient does not drink caffeine.   Social Determinants of Health   Financial Resource Strain: Not on file  Food Insecurity: Not on file  Transportation Needs: Not on file  Physical Activity: Not on file  Stress: Not on file  Social Connections: Not on file  Intimate Partner Violence: Not on file  FAMILY HISTORY: Family History  Problem Relation Age of Onset   Anesthesia problems Neg Hx    Hypotension Neg Hx    Malignant hyperthermia Neg Hx    Pseudochol deficiency Neg Hx    Cancer Mother    Stroke Father    Lymphoma Brother     ALLERGIES:  has No Known Allergies.  MEDICATIONS:  Current Outpatient Medications  Medication Sig Dispense Refill   abiraterone acetate (ZYTIGA) 250 MG tablet Take 4 tablets (1,000 mg total) by mouth daily. Take on an empty stomach 1 hour before or 2 hours after a meal 120 tablet 0   amLODipine (NORVASC) 5 MG tablet Take 5 mg by mouth daily.      escitalopram (LEXAPRO) 10 MG tablet Take 10 mg by mouth daily.     metoprolol succinate (TOPROL-XL) 100 MG 24 hr tablet Take 100 mg by mouth daily.     Multiple Vitamins-Minerals (MULTIVITAMINS THER. W/MINERALS) TABS Take 1 tablet by mouth daily.       MYRBETRIQ 25 MG TB24 tablet Take 25 mg by mouth daily.     predniSONE (DELTASONE) 5 MG tablet Take 1 tablet (5 mg total) by mouth daily with breakfast. 90 tablet 1   No current facility-administered medications for this visit.    REVIEW OF SYSTEMS:   Constitutional: ( - ) fevers, ( - )  chills , ( - ) night sweats Eyes: ( - ) blurriness of vision, ( - ) double vision, ( - ) watery eyes Ears, nose, mouth, throat, and face: ( - ) mucositis, ( - ) sore throat Respiratory: ( - ) cough, ( - ) dyspnea, ( - ) wheezes Cardiovascular: ( - ) palpitation, ( - ) chest discomfort, ( - ) lower extremity swelling Gastrointestinal:  ( - ) nausea, ( - ) heartburn, ( - ) change in bowel habits Skin: ( - ) abnormal skin rashes Lymphatics: ( - ) new lymphadenopathy, ( - ) easy bruising Neurological: ( - ) numbness, ( - ) tingling, ( - ) new weaknesses Behavioral/Psych: ( - ) mood change, ( - ) new changes  All other systems were reviewed with the patient and are negative.  PHYSICAL EXAMINATION: ECOG PERFORMANCE STATUS: 0 - Asymptomatic  Vitals:   02/11/23 1520  BP: (!) 155/81  Pulse: (!) 53  Resp: 16  Temp: 98.1 F (36.7 C)  SpO2: 95%    Filed Weights   02/11/23 1520  Weight: 126 lb (57.2 kg)   GENERAL: well appearing elderly male alert, no distress and comfortable SKIN: skin color, texture, turgor are normal, no rashes or significant lesions EYES: conjunctiva are pink and non-injected, sclera clear LUNGS: clear to auscultation and percussion with normal breathing effort HEART: regular rate & rhythm and no murmurs and no lower extremity edema Musculoskeletal: no cyanosis of digits and no clubbing  PSYCH: alert & oriented x 3, fluent  speech NEURO: no focal motor/sensory deficits  LABORATORY DATA:  I have reviewed the data as listed    Latest Ref Rng & Units 02/11/2023    3:06 PM 12/18/2022    2:54 PM 11/11/2022    2:04 PM  CBC  WBC 4.0 - 10.5 K/uL 4.8  4.2  4.2   Hemoglobin 13.0 - 17.0 g/dL 16.1  09.6  04.5   Hematocrit 39.0 - 52.0 % 40.1  40.6  41.3   Platelets 150 - 400 K/uL 185  177  186        Latest Ref Rng &  Units 02/11/2023    3:06 PM 12/18/2022    2:54 PM 11/11/2022    2:04 PM  CMP  Glucose 70 - 99 mg/dL 161  096  92   BUN 8 - 23 mg/dL 27  21  22    Creatinine 0.61 - 1.24 mg/dL 0.45  4.09  8.11   Sodium 135 - 145 mmol/L 143  140  139   Potassium 3.5 - 5.1 mmol/L 3.6  3.7  3.7   Chloride 98 - 111 mmol/L 108  105  105   CO2 22 - 32 mmol/L 29  29  27    Calcium 8.9 - 10.3 mg/dL 9.0  8.8  8.5   Total Protein 6.5 - 8.1 g/dL 7.1  7.0  7.3   Total Bilirubin 0.3 - 1.2 mg/dL 0.7  0.8  0.5   Alkaline Phos 38 - 126 U/L 39  38  37   AST 15 - 41 U/L 18  17  21    ALT 0 - 44 U/L 7  6  9      RADIOGRAPHIC STUDIES: No results found.  ASSESSMENT & PLAN Todd Holmes 89 y.o. male with medical history significant for Castration-Resistant Advanced Prostate Cancer  presents for a follow up visit.   # Castration-Resistant Advanced Prostate Cancer  -- Continue Eligard therapy with urology. -- Continue zytiga 1000 MG PO daily with prednisone 5 mg.  --Due to progression on enzalutamide transitioned to abiraterone 1000 mg p.o. daily with prednisone 5 mg p.o. daily -- Last PSA was 468, increase from 212 on 07/02/2023. Testosterone was at Castrate levels. This was repeated today.  -- Recommend repeat PSMA testing.  The order is in.  Provided patient with a number to call to have this scheduled --Labs show white blood cell count 4.8, hemoglobin 13.0, MCV 89.7, and platelets of 185 -- Return to clinic in 4 weeks time   No orders of the defined types were placed in this encounter.   All questions were answered. The  patient knows to call the clinic with any problems, questions or concerns.  A total of more than 30 minutes were spent on this encounter with face-to-face time and non-face-to-face time, including preparing to see the patient, ordering tests and/or medications, counseling the patient and coordination of care as outlined above.   Ulysees Barns, MD Department of Hematology/Oncology [facility] at [facility] Phone: [phone] Pager: [phone] Email: [email or web address]  02/15/2023 9:11 AM

## 2023-02-12 ENCOUNTER — Telehealth: Payer: Self-pay | Admitting: Hematology and Oncology

## 2023-02-13 LAB — TESTOSTERONE: Testosterone: <3 ng/dL — ABNORMAL LOW (ref 264–916)

## 2023-02-13 LAB — PROSTATE-SPECIFIC AG, SERUM (LABCORP): Prostate Specific Ag, Serum: 884 ng/mL — ABNORMAL HIGH (ref 0.0–4.0)

## 2023-02-16 ENCOUNTER — Other Ambulatory Visit: Payer: Self-pay | Admitting: Physician Assistant

## 2023-02-16 ENCOUNTER — Other Ambulatory Visit: Payer: Self-pay

## 2023-02-16 ENCOUNTER — Other Ambulatory Visit (HOSPITAL_COMMUNITY): Payer: Self-pay

## 2023-02-16 MED ORDER — ABIRATERONE ACETATE 250 MG PO TABS
1000.0000 mg | ORAL_TABLET | Freq: Every day | ORAL | 0 refills | Status: DC
  Filled 2023-02-16: qty 120, 30d supply, fill #0

## 2023-02-19 ENCOUNTER — Other Ambulatory Visit (HOSPITAL_COMMUNITY): Payer: Self-pay

## 2023-02-26 ENCOUNTER — Other Ambulatory Visit: Payer: Self-pay

## 2023-02-26 ENCOUNTER — Other Ambulatory Visit (HOSPITAL_COMMUNITY): Payer: Self-pay

## 2023-03-01 ENCOUNTER — Encounter (HOSPITAL_COMMUNITY)
Admission: RE | Admit: 2023-03-01 | Discharge: 2023-03-01 | Disposition: A | Discharge status: Home / Self Care | Payer: Medicare Other | Source: Ambulatory Visit | Attending: Hematology and Oncology | Admitting: Hematology and Oncology

## 2023-03-01 DIAGNOSIS — Z192 Hormone resistant malignancy status: Secondary | ICD-10-CM | POA: Insufficient documentation

## 2023-03-01 DIAGNOSIS — C61 Malignant neoplasm of prostate: Secondary | ICD-10-CM | POA: Diagnosis present

## 2023-03-01 MED ORDER — PIFLIFOLASTAT F 18 (PYLARIFY) INJECTION
9.0000 | Freq: Once | INTRAVENOUS | Status: AC
  Administered 2023-03-01: 0.2 via INTRAVENOUS

## 2023-03-08 ENCOUNTER — Inpatient Hospital Stay: Payer: Medicare Other | Admitting: Hematology and Oncology

## 2023-03-08 ENCOUNTER — Other Ambulatory Visit: Payer: Self-pay

## 2023-03-08 ENCOUNTER — Inpatient Hospital Stay: Payer: Medicare Other | Attending: Nurse Practitioner

## 2023-03-08 VITALS — BP 155/89 | HR 85 | Temp 97.5°F | Resp 16 | Wt 130.9 lb

## 2023-03-08 DIAGNOSIS — C61 Malignant neoplasm of prostate: Secondary | ICD-10-CM

## 2023-03-08 DIAGNOSIS — Z192 Hormone resistant malignancy status: Secondary | ICD-10-CM

## 2023-03-08 DIAGNOSIS — C7951 Secondary malignant neoplasm of bone: Secondary | ICD-10-CM | POA: Diagnosis not present

## 2023-03-08 LAB — CBC WITH DIFFERENTIAL (CANCER CENTER ONLY)
Abs Immature Granulocytes: 0.02 10*3/uL (ref 0.00–0.07)
Basophils Absolute: 0 10*3/uL (ref 0.0–0.1)
Basophils Relative: 1 %
Eosinophils Absolute: 0.1 10*3/uL (ref 0.0–0.5)
Eosinophils Relative: 1 %
HCT: 37.1 % — ABNORMAL LOW (ref 39.0–52.0)
Hemoglobin: 12.3 g/dL — ABNORMAL LOW (ref 13.0–17.0)
Immature Granulocytes: 0 %
Lymphocytes Relative: 22 %
Lymphs Abs: 1.2 10*3/uL (ref 0.7–4.0)
MCH: 29.2 pg (ref 26.0–34.0)
MCHC: 33.2 g/dL (ref 30.0–36.0)
MCV: 88.1 fL (ref 80.0–100.0)
Monocytes Absolute: 0.5 10*3/uL (ref 0.1–1.0)
Monocytes Relative: 9 %
Neutro Abs: 3.5 10*3/uL (ref 1.7–7.7)
Neutrophils Relative %: 67 %
Platelet Count: 164 10*3/uL (ref 150–400)
RBC: 4.21 MIL/uL — ABNORMAL LOW (ref 4.22–5.81)
RDW: 13.4 % (ref 11.5–15.5)
WBC Count: 5.2 10*3/uL (ref 4.0–10.5)
nRBC: 0 % (ref 0.0–0.2)

## 2023-03-08 LAB — CMP (CANCER CENTER ONLY)
ALT: 8 U/L (ref 0–44)
AST: 21 U/L (ref 15–41)
Albumin: 3.5 g/dL (ref 3.5–5.0)
Alkaline Phosphatase: 34 U/L — ABNORMAL LOW (ref 38–126)
Anion gap: 9 (ref 5–15)
BUN: 20 mg/dL (ref 8–23)
CO2: 29 mmol/L (ref 22–32)
Calcium: 8.9 mg/dL (ref 8.9–10.3)
Chloride: 104 mmol/L (ref 98–111)
Creatinine: 0.98 mg/dL (ref 0.61–1.24)
GFR, Estimated: >60 mL/min (ref >60)
Glucose, Bld: 117 mg/dL — ABNORMAL HIGH (ref 70–99)
Potassium: 2.8 mmol/L — ABNORMAL LOW (ref 3.5–5.1)
Sodium: 142 mmol/L (ref 135–145)
Total Bilirubin: 1 mg/dL (ref 0.3–1.2)
Total Protein: 6.3 g/dL — ABNORMAL LOW (ref 6.5–8.1)

## 2023-03-08 NOTE — Progress Notes (Signed)
[facility] [phone]   [phone]  PROGRESS NOTE  Patient Care Team: Lorenda Ishihara, MD as PCP - General (Internal Medicine) Cherlyn Cushing, RN as Oncology Nurse Navigator  Hematological/Oncological History # Castration-Resistant Advanced Prostate Cancer  11/2021: started eligard and Xtandi 160 mg PO daily.  08/12/2022: last visit with Dr. Clelia Croft 11/11/2022: transition care to Dr. Leonides Schanz  01/20/2022: approximate start date of Zytiga 1000 mg PO daily with prednisone 5 mg  Interval History:  Todd Holmes 89 y.o. male with medical history significant for Castration-Resistant Advanced Prostate Cancer  presents for a follow up visit. The patient's last visit was on 02/11/2023. In the interim since the last visit he has continued on Zytiga therapy.    On exam today Todd Holmes reports that he "seems fairly well".  He notes that he does use a cane occasionally when he is unsteady on his feet.  He reports his energy is not that much.  He reports that he does live alone but members of his church to come over on occasion and bring him food and cook.  He reports that he continues to take his Zytiga pill and that it is "large and has a bitter taste".  He notes that he does typically take it with applesauce or hard candy afterwards.  He notes that he is still driving but tends to only drive very short distances during the day.  He denies any new bone or back pain.  He is otherwise stable and denies any fevers, chills, sweats, nausea, vomiting or diarrhea.  Full 10 point ROS is otherwise negative.     MEDICAL HISTORY:  Past Medical History:  Diagnosis Date   Arthritis    knees   Cancer (HCC)    prostate, tx 2005- radiation    Keloid    keloid across chest, pt. unsure of how he encountered it   Memory disorder 11/28/2014   Neuromuscular disorder (HCC)    benign- tremor- occas., treated /w nadolol   Tremor 11/28/2014    SURGICAL HISTORY: Past Surgical  History:  Procedure Laterality Date   CATARACT EXTRACTION W/PHACO  09/23/2011   Procedure: CATARACT EXTRACTION PHACO AND INTRAOCULAR LENS PLACEMENT (IOC);  Surgeon: Chalmers Guest, MD;  Location: [facility] OR;  Service: Ophthalmology;  Laterality: Right;   EYE SURGERY Bilateral    L cataract removed & IOL   HERNIA REPAIR     bilateral hernia repair, inguinal, 1980's      SOCIAL HISTORY: Social History   Socioeconomic History   Marital status: Single    Spouse name: Not on file   Number of children: 0   Years of education: 14   Highest education level: Not on file  Occupational History   Occupation: retired  Tobacco Use   Smoking status: Never   Smokeless tobacco: Never  Substance and Sexual Activity   Alcohol use: No   Drug use: No   Sexual activity: Not on file  Other Topics Concern   Not on file  Social History Narrative   Patient is right handed.   Patient does not drink caffeine.   Social Determinants of Health   Financial Resource Strain: Not on file  Food Insecurity: Not on file  Transportation Needs: Not on file  Physical Activity: Not on file  Stress: Not on file  Social Connections: Not on file  Intimate Partner Violence: Not on file    FAMILY HISTORY: Family History  Problem Relation Age of Onset   Anesthesia problems  Neg Hx    Hypotension Neg Hx    Malignant hyperthermia Neg Hx    Pseudochol deficiency Neg Hx    Cancer Mother    Stroke Father    Lymphoma Brother     ALLERGIES:  has No Known Allergies.  MEDICATIONS:  Current Outpatient Medications  Medication Sig Dispense Refill   abiraterone acetate (ZYTIGA) 250 MG tablet Take 4 tablets (1,000 mg total) by mouth daily. Take on an empty stomach 1 hour before or 2 hours after a meal 120 tablet 0   amLODipine (NORVASC) 5 MG tablet Take 5 mg by mouth daily.     escitalopram (LEXAPRO) 10 MG tablet Take 10 mg by mouth daily.     metoprolol succinate (TOPROL-XL) 100 MG 24 hr tablet Take 100 mg by mouth daily.      Multiple Vitamins-Minerals (MULTIVITAMINS THER. W/MINERALS) TABS Take 1 tablet by mouth daily.       MYRBETRIQ 25 MG TB24 tablet Take 25 mg by mouth daily.     predniSONE (DELTASONE) 5 MG tablet Take 1 tablet (5 mg total) by mouth daily with breakfast. 90 tablet 1   No current facility-administered medications for this visit.    REVIEW OF SYSTEMS:   Constitutional: ( - ) fevers, ( - )  chills , ( - ) night sweats Eyes: ( - ) blurriness of vision, ( - ) double vision, ( - ) watery eyes Ears, nose, mouth, throat, and face: ( - ) mucositis, ( - ) sore throat Respiratory: ( - ) cough, ( - ) dyspnea, ( - ) wheezes Cardiovascular: ( - ) palpitation, ( - ) chest discomfort, ( - ) lower extremity swelling Gastrointestinal:  ( - ) nausea, ( - ) heartburn, ( - ) change in bowel habits Skin: ( - ) abnormal skin rashes Lymphatics: ( - ) new lymphadenopathy, ( - ) easy bruising Neurological: ( - ) numbness, ( - ) tingling, ( - ) new weaknesses Behavioral/Psych: ( - ) mood change, ( - ) new changes  All other systems were reviewed with the patient and are negative.  PHYSICAL EXAMINATION: ECOG PERFORMANCE STATUS: 0 - Asymptomatic  Vitals:   03/08/23 1537  BP: (!) 155/89  Pulse: 85  Resp: 16  Temp: (!) 97.5 F (36.4 C)  SpO2: 96%     Filed Weights   03/08/23 1537  Weight: 130 lb 14.4 oz (59.4 kg)    GENERAL: well appearing elderly male alert, no distress and comfortable SKIN: skin color, texture, turgor are normal, no rashes or significant lesions EYES: conjunctiva are pink and non-injected, sclera clear LUNGS: clear to auscultation and percussion with normal breathing effort HEART: regular rate & rhythm and no murmurs and no lower extremity edema Musculoskeletal: no cyanosis of digits and no clubbing  PSYCH: alert & oriented x 3, fluent speech NEURO: no focal motor/sensory deficits  LABORATORY DATA:  I have reviewed the data as listed    Latest Ref Rng & Units 03/08/2023     3:02 PM 02/11/2023    3:06 PM 12/18/2022    2:54 PM  CBC  WBC 4.0 - 10.5 K/uL 5.2  4.8  4.2   Hemoglobin 13.0 - 17.0 g/dL 16.1  09.6  04.5   Hematocrit 39.0 - 52.0 % 37.1  40.1  40.6   Platelets 150 - 400 K/uL 164  185  177        Latest Ref Rng & Units 03/08/2023    3:02 PM 02/11/2023  3:06 PM 12/18/2022    2:54 PM  CMP  Glucose 70 - 99 mg/dL 161  096  045   BUN 8 - 23 mg/dL 20  27  21    Creatinine 0.61 - 1.24 mg/dL 4.09  8.11  9.14   Sodium 135 - 145 mmol/L 142  143  140   Potassium 3.5 - 5.1 mmol/L 2.8  3.6  3.7   Chloride 98 - 111 mmol/L 104  108  105   CO2 22 - 32 mmol/L 29  29  29    Calcium 8.9 - 10.3 mg/dL 8.9  9.0  8.8   Total Protein 6.5 - 8.1 g/dL 6.3  7.1  7.0   Total Bilirubin 0.3 - 1.2 mg/dL 1.0  0.7  0.8   Alkaline Phos 38 - 126 U/L 34  39  38   AST 15 - 41 U/L 21  18  17    ALT 0 - 44 U/L 8  7  6      RADIOGRAPHIC STUDIES: NM PET (PSMA) SKULL TO MID THIGH  Result Date: 03/08/2023 CLINICAL DATA:  Prostate cancer with bone metastasis. Assess treatment response. EXAM: NUCLEAR MEDICINE PET SKULL BASE TO THIGH TECHNIQUE: 9.2 mCi F18 Piflufolastat (Pylarify) was injected intravenously. Full-ring PET imaging was performed from the skull base to thigh after the radiotracer. CT data was obtained and used for attenuation correction and anatomic localization. COMPARISON:  PSMA PET-CT 06/26/2022 FINDINGS: NECK Interval enlargement of intensely radiotracer avid LEFT supraclavicular nodes. For example 2.2 cm node on image 65/4 increased from 10 mm. Activity remains intensely radiotracer avid (SUV max equal 45. No change in activity. Incidental CT finding: None. CHEST Enlarged radiotracer avid lymph nodes extend to the high LEFT paratracheal nodal station increased in size compared to prior. Nodes are contiguous with the LEFT supraclavicular nodes. No suspicious pulmonary nodules. Incidental CT finding: None. ABDOMEN/PELVIS Prostate: No clear radiotracer activity in the prostatectomy bed.  Lymph nodes: Interval enlargement of intense radiotracer avid lymph nodes at the aortic bifurcation measuring 3.0 cm compared to 1.1 cm with intense radiotracer activity (SUV max equal 26) Intense radiotracer avid tissue in the deep LEFT and RIGHT peritoneal space adjacent the rectum which is favored metastatic adenopathy (image 7171) Large periaortic mass measuring 4.7 cm on image 147/4 is increased from approximately 2 cm. This mass has less radiotracer activity than other retroperitoneal nodes with SUV max equal 10.0 potentially indicating necrosis. Enlarged lymph node deep to the IVC at the level of the RIGHT renal vein with SUV max equal 89 (image 132) Several new retrocrural nodes. Liver: No liver metastasis SKELETON Interval increase in size of radiotracer avid skeletal metastasis. Additional there are new skeletal metastasis. For example 2 new lesions in the sternum on image 74. New lesion in the medial RIGHT clavicle LEFT clavicle. New lesion in the shoulder girdles. Intensely radiotracer avid lesions again noted in the pelvis. For example broad lesion in the RIGHT sacral ala SUV max equal 63. IMPRESSION: 1. Interval progression of metastatic adenopathy in the LEFT supraclavicular nodal station, LEFT paratracheal nodal station, pelvic adenopathy and retroperitoneal nodal stations. 2. Interval progression of prostate cancer skeletal metastasis with several new lesions. 3. No liver or lung metastasis. Electronically Signed   By: Genevive Bi M.D.   On: 03/08/2023 08:49    ASSESSMENT & PLAN MISCHA BROBERG 89 y.o. male with medical history significant for Castration-Resistant Advanced Prostate Cancer  presents for a follow up visit.   # Castration-Resistant Advanced Prostate Cancer  --  Continue Eligard therapy with urology. -- Continue zytiga 1000 MG PO daily with prednisone 5 mg.  --Due to progression on enzalutamide transitioned to abiraterone 1000 mg p.o. daily with prednisone 5 mg p.o.  daily -- Last PSA 1094, currently rising.  -- PSMA scan has been performed and discussed with nuclear medicine.  They do believe he is a candidate for Pluvicto at this time. --Labs show white blood cell count 5.2, hemoglobin 12.3, MCV 88.1, and platelets of 164 -- Return to clinic in 4 weeks time   Orders Placed This Encounter  Procedures   NM PLUVICTO ADMINISTRATION    Standing Status:   Future    Standing Expiration Date:   03/07/2024    Order Specific Question:   Reason for Exam (SYMPTOM  OR DIAGNOSIS REQUIRED)    Answer:   metastatic castrate resistant prostate cancer    Order Specific Question:   If indicated for the ordered procedure, I authorize the administration of a radiopharmaceutical per Radiology protocol    Answer:   Yes    Order Specific Question:   Preferred imaging location?    Answer:   [facility]    All questions were answered. The patient knows to call the clinic with any problems, questions or concerns.  A total of more than 30 minutes were spent on this encounter with face-to-face time and non-face-to-face time, including preparing to see the patient, ordering tests and/or medications, counseling the patient and coordination of care as outlined above.   Ulysees Barns, MD Department of Hematology/Oncology [facility] Phone: [phone] Pager: [phone] Email: [email or web address]  03/14/2023 6:13 PM

## 2023-03-09 LAB — TESTOSTERONE: Testosterone: <3 ng/dL — ABNORMAL LOW (ref 264–916)

## 2023-03-09 LAB — PROSTATE-SPECIFIC AG, SERUM (LABCORP): Prostate Specific Ag, Serum: 1094 ng/mL — ABNORMAL HIGH (ref 0.0–4.0)

## 2023-03-19 ENCOUNTER — Other Ambulatory Visit (HOSPITAL_COMMUNITY): Payer: Self-pay | Admitting: Diagnostic Radiology

## 2023-03-19 DIAGNOSIS — C61 Malignant neoplasm of prostate: Secondary | ICD-10-CM

## 2023-04-02 ENCOUNTER — Telehealth: Payer: Self-pay | Admitting: Hematology and Oncology

## 2023-04-02 NOTE — Telephone Encounter (Signed)
7/19 @ 11:44 am patient left voicemail to confirm his appointment.  He has no appts with RadOnc.  Confirmed upcoming appts with [facility] and Dr. Leonides Schanz.  Patient is aware and stated will be here.

## 2023-04-07 ENCOUNTER — Other Ambulatory Visit (HOSPITAL_COMMUNITY): Payer: Self-pay | Admitting: Hematology and Oncology

## 2023-04-07 DIAGNOSIS — C61 Malignant neoplasm of prostate: Secondary | ICD-10-CM

## 2023-04-08 ENCOUNTER — Ambulatory Visit (HOSPITAL_COMMUNITY)
Admission: RE | Admit: 2023-04-08 | Discharge: 2023-04-08 | Disposition: A | Discharge status: Home / Self Care | Payer: Medicare Other | Source: Ambulatory Visit | Attending: Diagnostic Radiology | Admitting: Diagnostic Radiology

## 2023-04-08 ENCOUNTER — Encounter (HOSPITAL_COMMUNITY): Payer: Self-pay

## 2023-04-08 ENCOUNTER — Other Ambulatory Visit (HOSPITAL_COMMUNITY): Payer: Self-pay | Admitting: Hematology and Oncology

## 2023-04-08 ENCOUNTER — Ambulatory Visit (HOSPITAL_COMMUNITY)
Admission: RE | Admit: 2023-04-08 | Discharge: 2023-04-08 | Disposition: A | Discharge status: Home / Self Care | Payer: Medicare Other | Source: Ambulatory Visit | Attending: Hematology and Oncology | Admitting: Hematology and Oncology

## 2023-04-08 DIAGNOSIS — C61 Malignant neoplasm of prostate: Secondary | ICD-10-CM

## 2023-04-08 NOTE — Progress Notes (Signed)
[facility] [phone]   [phone]  PROGRESS NOTE  Patient Care Team: Lorenda Ishihara, MD as PCP - General (Internal Medicine) Cherlyn Cushing, RN as Oncology Nurse Navigator  Hematological/Oncological History # Castration-Resistant Advanced Prostate Cancer  11/2021: started eligard and Xtandi 160 mg PO daily.  08/12/2022: last visit with Dr. Clelia Croft 11/11/2022: transition care to Dr. Leonides Schanz  01/20/2022: approximate start date of Zytiga 1000 mg PO daily with prednisone 5 mg  Interval History:  Todd Holmes 89 y.o. male with medical history significant for Castration-Resistant Advanced Prostate Cancer  presents for a follow up visit. The patient's last visit was on 03/08/2023. In the interim since the last visit he has continued on Zytiga therapy and met with nuclear medicine for discussion of Pluvicto therapy.    On exam today Todd Holmes is accompanied by his adopted niece.  He reports he had a good visit with nuclear medicine yesterday.  He notes that they are currently setting up his Pluvicto therapy.  He has been continuing his Zytiga 1000 g p.o. daily and finished his last dose yesterday.  He notes that he is appetite is good and his energy is strong.  He is not having any pain right now.  He is doing his best to try to stay hydrated and stay cool.  He is not having any other major side effects as a result of his Lupron or Zytiga therapy.  He denies any lightheadedness, dizziness, shortness of breath.  He is otherwise stable and denies any fevers, chills, sweats, nausea, vomiting or diarrhea.  Full 10 point ROS is otherwise negative.     MEDICAL HISTORY:  Past Medical History:  Diagnosis Date   Arthritis    knees   Cancer (HCC)    prostate, tx 2005- radiation    Keloid    keloid across chest, pt. unsure of how he encountered it   Memory disorder 11/28/2014   Neuromuscular disorder (HCC)    benign- tremor- occas., treated /w nadolol    Tremor 11/28/2014    SURGICAL HISTORY: Past Surgical History:  Procedure Laterality Date   CATARACT EXTRACTION W/PHACO  09/23/2011   Procedure: CATARACT EXTRACTION PHACO AND INTRAOCULAR LENS PLACEMENT (IOC);  Surgeon: Chalmers Guest, MD;  Location: [facility] OR;  Service: Ophthalmology;  Laterality: Right;   EYE SURGERY Bilateral    L cataract removed & IOL   HERNIA REPAIR     bilateral hernia repair, inguinal, 1980's      SOCIAL HISTORY: Social History   Socioeconomic History   Marital status: Single    Spouse name: Not on file   Number of children: 0   Years of education: 14   Highest education level: Not on file  Occupational History   Occupation: retired  Tobacco Use   Smoking status: Never   Smokeless tobacco: Never  Substance and Sexual Activity   Alcohol use: No   Drug use: No   Sexual activity: Not on file  Other Topics Concern   Not on file  Social History Narrative   Patient is right handed.   Patient does not drink caffeine.   Social Determinants of Health   Financial Resource Strain: Not on file  Food Insecurity: Not on file  Transportation Needs: Not on file  Physical Activity: Not on file  Stress: Not on file  Social Connections: Not on file  Intimate Partner Violence: Not on file    FAMILY HISTORY: Family History  Problem Relation Age of Onset  Anesthesia problems Neg Hx    Hypotension Neg Hx    Malignant hyperthermia Neg Hx    Pseudochol deficiency Neg Hx    Cancer Mother    Stroke Father    Lymphoma Brother     ALLERGIES:  has No Known Allergies.  MEDICATIONS:  Current Outpatient Medications  Medication Sig Dispense Refill   abiraterone acetate (ZYTIGA) 250 MG tablet Take 4 tablets (1,000 mg total) by mouth daily. Take on an empty stomach 1 hour before or 2 hours after a meal 120 tablet 0   amLODipine (NORVASC) 5 MG tablet Take 5 mg by mouth daily.     escitalopram (LEXAPRO) 10 MG tablet Take 10 mg by mouth daily.     metoprolol succinate  (TOPROL-XL) 100 MG 24 hr tablet Take 100 mg by mouth daily.     Multiple Vitamins-Minerals (MULTIVITAMINS THER. W/MINERALS) TABS Take 1 tablet by mouth daily.       MYRBETRIQ 25 MG TB24 tablet Take 25 mg by mouth daily.     predniSONE (DELTASONE) 5 MG tablet Take 1 tablet (5 mg total) by mouth daily with breakfast. 90 tablet 1   No current facility-administered medications for this visit.    REVIEW OF SYSTEMS:   Constitutional: ( - ) fevers, ( - )  chills , ( - ) night sweats Eyes: ( - ) blurriness of vision, ( - ) double vision, ( - ) watery eyes Ears, nose, mouth, throat, and face: ( - ) mucositis, ( - ) sore throat Respiratory: ( - ) cough, ( - ) dyspnea, ( - ) wheezes Cardiovascular: ( - ) palpitation, ( - ) chest discomfort, ( - ) lower extremity swelling Gastrointestinal:  ( - ) nausea, ( - ) heartburn, ( - ) change in bowel habits Skin: ( - ) abnormal skin rashes Lymphatics: ( - ) new lymphadenopathy, ( - ) easy bruising Neurological: ( - ) numbness, ( - ) tingling, ( - ) new weaknesses Behavioral/Psych: ( - ) mood change, ( - ) new changes  All other systems were reviewed with the patient and are negative.  PHYSICAL EXAMINATION: ECOG PERFORMANCE STATUS: 0 - Asymptomatic  Vitals:   04/09/23 1524  BP: 138/72  Pulse: (!) 50  Resp: 16  Temp: 97.7 F (36.5 C)  SpO2: 100%      Filed Weights   04/09/23 1524  Weight: 131 lb 3.2 oz (59.5 kg)     GENERAL: well appearing elderly male alert, no distress and comfortable SKIN: skin color, texture, turgor are normal, no rashes or significant lesions EYES: conjunctiva are pink and non-injected, sclera clear LUNGS: clear to auscultation and percussion with normal breathing effort HEART: regular rate & rhythm and no murmurs and no lower extremity edema Musculoskeletal: no cyanosis of digits and no clubbing  PSYCH: alert & oriented x 3, fluent speech NEURO: no focal motor/sensory deficits  LABORATORY DATA:  I have reviewed  the data as listed    Latest Ref Rng & Units 04/09/2023    3:03 PM 03/08/2023    3:02 PM 02/11/2023    3:06 PM  CBC  WBC 4.0 - 10.5 K/uL 5.5  5.2  4.8   Hemoglobin 13.0 - 17.0 g/dL 82.9  56.2  13.0   Hematocrit 39.0 - 52.0 % 34.9  37.1  40.1   Platelets 150 - 400 K/uL 178  164  185        Latest Ref Rng & Units 04/09/2023    3:03 PM  03/08/2023    3:02 PM 02/11/2023    3:06 PM  CMP  Glucose 70 - 99 mg/dL 098  119  147   BUN 8 - 23 mg/dL 28  20  27    Creatinine 0.61 - 1.24 mg/dL 8.29  5.62  1.30   Sodium 135 - 145 mmol/L 141  142  143   Potassium 3.5 - 5.1 mmol/L 3.4  2.8  3.6   Chloride 98 - 111 mmol/L 107  104  108   CO2 22 - 32 mmol/L 28  29  29    Calcium 8.9 - 10.3 mg/dL 8.9  8.9  9.0   Total Protein 6.5 - 8.1 g/dL 6.4  6.3  7.1   Total Bilirubin 0.3 - 1.2 mg/dL 1.2  1.0  0.7   Alkaline Phos 38 - 126 U/L 37  34  39   AST 15 - 41 U/L 18  21  18    ALT 0 - 44 U/L 7  8  7      RADIOGRAPHIC STUDIES: NM NO REPORT/NO CHARGE  Result Date: 04/08/2023 There is no Radiologist interpretation  for this exam.   ASSESSMENT & PLAN Todd Holmes 89 y.o. male with medical history significant for Castration-Resistant Advanced Prostate Cancer  presents for a follow up visit.   # Castration-Resistant Advanced Prostate Cancer  -- Continue Eligard therapy with urology. -- Continue zytiga 1000 MG PO daily with prednisone 5 mg.  --Due to progression on enzalutamide transitioned to abiraterone 1000 mg p.o. daily with prednisone 5 mg p.o. daily -- Last PSA 1094, currently rising.  -- PSMA scan has been performed and discussed with nuclear medicine.  They do believe he is a candidate for Pluvicto at this time. --Labs show white blood cell count 5.5, hemoglobin 11.9, MCV 87.7, and platelets of 178 -- Return to clinic in 4 weeks time   No orders of the defined types were placed in this encounter.   All questions were answered. The patient knows to call the clinic with any problems, questions or  concerns.  A total of more than 30 minutes were spent on this encounter with face-to-face time and non-face-to-face time, including preparing to see the patient, ordering tests and/or medications, counseling the patient and coordination of care as outlined above.   Ulysees Barns, MD Department of Hematology/Oncology [facility] Phone: [phone] Pager: [phone] Email: [email or web address]  04/11/2023 5:48 PM

## 2023-04-09 ENCOUNTER — Inpatient Hospital Stay (HOSPITAL_BASED_OUTPATIENT_CLINIC_OR_DEPARTMENT_OTHER): Payer: Medicare Other | Admitting: Hematology and Oncology

## 2023-04-09 ENCOUNTER — Inpatient Hospital Stay: Payer: Medicare Other | Attending: Nurse Practitioner

## 2023-04-09 ENCOUNTER — Other Ambulatory Visit: Payer: Self-pay

## 2023-04-09 VITALS — BP 138/72 | HR 50 | Temp 97.7°F | Resp 16 | Wt 131.2 lb

## 2023-04-09 DIAGNOSIS — I10 Essential (primary) hypertension: Secondary | ICD-10-CM | POA: Diagnosis not present

## 2023-04-09 DIAGNOSIS — Z79899 Other long term (current) drug therapy: Secondary | ICD-10-CM | POA: Diagnosis not present

## 2023-04-09 DIAGNOSIS — Z192 Hormone resistant malignancy status: Secondary | ICD-10-CM | POA: Diagnosis not present

## 2023-04-09 DIAGNOSIS — C61 Malignant neoplasm of prostate: Secondary | ICD-10-CM | POA: Insufficient documentation

## 2023-04-09 LAB — CMP (CANCER CENTER ONLY)
ALT: 7 U/L (ref 0–44)
AST: 18 U/L (ref 15–41)
Albumin: 3.7 g/dL (ref 3.5–5.0)
Alkaline Phosphatase: 37 U/L — ABNORMAL LOW (ref 38–126)
Anion gap: 6 (ref 5–15)
BUN: 28 mg/dL — ABNORMAL HIGH (ref 8–23)
CO2: 28 mmol/L (ref 22–32)
Calcium: 8.9 mg/dL (ref 8.9–10.3)
Chloride: 107 mmol/L (ref 98–111)
Creatinine: 0.97 mg/dL (ref 0.61–1.24)
GFR, Estimated: >60 mL/min (ref >60)
Glucose, Bld: 106 mg/dL — ABNORMAL HIGH (ref 70–99)
Potassium: 3.4 mmol/L — ABNORMAL LOW (ref 3.5–5.1)
Sodium: 141 mmol/L (ref 135–145)
Total Bilirubin: 1.2 mg/dL (ref 0.3–1.2)
Total Protein: 6.4 g/dL — ABNORMAL LOW (ref 6.5–8.1)

## 2023-04-09 LAB — CBC WITH DIFFERENTIAL (CANCER CENTER ONLY)
Abs Immature Granulocytes: 0.02 10*3/uL (ref 0.00–0.07)
Basophils Absolute: 0 10*3/uL (ref 0.0–0.1)
Basophils Relative: 1 %
Eosinophils Absolute: 0.1 10*3/uL (ref 0.0–0.5)
Eosinophils Relative: 2 %
HCT: 34.9 % — ABNORMAL LOW (ref 39.0–52.0)
Hemoglobin: 11.9 g/dL — ABNORMAL LOW (ref 13.0–17.0)
Immature Granulocytes: 0 %
Lymphocytes Relative: 28 %
Lymphs Abs: 1.5 10*3/uL (ref 0.7–4.0)
MCH: 29.9 pg (ref 26.0–34.0)
MCHC: 34.1 g/dL (ref 30.0–36.0)
MCV: 87.7 fL (ref 80.0–100.0)
Monocytes Absolute: 0.6 10*3/uL (ref 0.1–1.0)
Monocytes Relative: 12 %
Neutro Abs: 3.2 10*3/uL (ref 1.7–7.7)
Neutrophils Relative %: 57 %
Platelet Count: 178 10*3/uL (ref 150–400)
RBC: 3.98 MIL/uL — ABNORMAL LOW (ref 4.22–5.81)
RDW: 14 % (ref 11.5–15.5)
WBC Count: 5.5 10*3/uL (ref 4.0–10.5)
nRBC: 0 % (ref 0.0–0.2)

## 2023-04-09 NOTE — Patient Instructions (Signed)
1) OK to stop Zytiga pills. Please bring your new sealed bottle back to the Cancer Center or [facility] specialty pharmacy 2) Make sure you continue to eat well and drink protein shakes. Encourage eating Red meat (hamburger, steak) and dark green leafy vegetables. 3) Make sure once you start Pluvicto you continue to drink plenty of fluids  4) Call [phone] to set up [facility] 5) Continue to take Lupron shots as scheduled with [facility]  6) we will see you back in clinic after your first dose of pluvicto

## 2023-04-12 ENCOUNTER — Other Ambulatory Visit: Payer: Self-pay | Admitting: Hematology and Oncology

## 2023-04-12 ENCOUNTER — Other Ambulatory Visit (HOSPITAL_COMMUNITY): Payer: Self-pay | Admitting: Hematology and Oncology

## 2023-04-12 DIAGNOSIS — C61 Malignant neoplasm of prostate: Secondary | ICD-10-CM

## 2023-04-12 DIAGNOSIS — Z192 Hormone resistant malignancy status: Secondary | ICD-10-CM

## 2023-04-12 DIAGNOSIS — C7951 Secondary malignant neoplasm of bone: Secondary | ICD-10-CM

## 2023-04-20 ENCOUNTER — Other Ambulatory Visit: Payer: Self-pay | Admitting: *Deleted

## 2023-04-27 ENCOUNTER — Other Ambulatory Visit: Payer: Self-pay

## 2023-05-04 ENCOUNTER — Inpatient Hospital Stay: Payer: Medicare Other | Attending: Nurse Practitioner

## 2023-05-04 DIAGNOSIS — C61 Malignant neoplasm of prostate: Secondary | ICD-10-CM | POA: Insufficient documentation

## 2023-05-04 DIAGNOSIS — Z192 Hormone resistant malignancy status: Secondary | ICD-10-CM

## 2023-05-04 LAB — CBC WITH DIFFERENTIAL (CANCER CENTER ONLY)
Abs Immature Granulocytes: 0.02 10*3/uL (ref 0.00–0.07)
Basophils Absolute: 0 10*3/uL (ref 0.0–0.1)
Basophils Relative: 1 %
Eosinophils Absolute: 0.1 10*3/uL (ref 0.0–0.5)
Eosinophils Relative: 2 %
HCT: 37.7 % — ABNORMAL LOW (ref 39.0–52.0)
Hemoglobin: 12.2 g/dL — ABNORMAL LOW (ref 13.0–17.0)
Immature Granulocytes: 0 %
Lymphocytes Relative: 19 %
Lymphs Abs: 1 10*3/uL (ref 0.7–4.0)
MCH: 29.3 pg (ref 26.0–34.0)
MCHC: 32.4 g/dL (ref 30.0–36.0)
MCV: 90.4 fL (ref 80.0–100.0)
Monocytes Absolute: 0.6 10*3/uL (ref 0.1–1.0)
Monocytes Relative: 10 %
Neutro Abs: 3.7 10*3/uL (ref 1.7–7.7)
Neutrophils Relative %: 68 %
Platelet Count: 199 10*3/uL (ref 150–400)
RBC: 4.17 MIL/uL — ABNORMAL LOW (ref 4.22–5.81)
RDW: 14.2 % (ref 11.5–15.5)
WBC Count: 5.4 10*3/uL (ref 4.0–10.5)
nRBC: 0 % (ref 0.0–0.2)

## 2023-05-04 LAB — CMP (CANCER CENTER ONLY)
ALT: 9 U/L (ref 0–44)
AST: 19 U/L (ref 15–41)
Albumin: 3.8 g/dL (ref 3.5–5.0)
Alkaline Phosphatase: 33 U/L — ABNORMAL LOW (ref 38–126)
Anion gap: 5 (ref 5–15)
BUN: 29 mg/dL — ABNORMAL HIGH (ref 8–23)
CO2: 29 mmol/L (ref 22–32)
Calcium: 9 mg/dL (ref 8.9–10.3)
Chloride: 105 mmol/L (ref 98–111)
Creatinine: 0.95 mg/dL (ref 0.61–1.24)
GFR, Estimated: >60 mL/min (ref >60)
Glucose, Bld: 110 mg/dL — ABNORMAL HIGH (ref 70–99)
Potassium: 4 mmol/L (ref 3.5–5.1)
Sodium: 139 mmol/L (ref 135–145)
Total Bilirubin: 0.6 mg/dL (ref 0.3–1.2)
Total Protein: 7 g/dL (ref 6.5–8.1)

## 2023-05-05 LAB — PROSTATE-SPECIFIC AG, SERUM (LABCORP): Prostate Specific Ag, Serum: 1507 ng/mL — ABNORMAL HIGH (ref 0.0–4.0)

## 2023-05-05 LAB — TESTOSTERONE: Testosterone: <3 ng/dL — ABNORMAL LOW (ref 264–916)

## 2023-05-11 ENCOUNTER — Ambulatory Visit (HOSPITAL_COMMUNITY)
Admission: RE | Admit: 2023-05-11 | Discharge: 2023-05-11 | Disposition: A | Discharge status: Home / Self Care | Payer: Medicare Other | Source: Ambulatory Visit | Attending: Hematology and Oncology | Admitting: Hematology and Oncology

## 2023-05-11 DIAGNOSIS — C61 Malignant neoplasm of prostate: Secondary | ICD-10-CM | POA: Diagnosis present

## 2023-05-11 DIAGNOSIS — Z192 Hormone resistant malignancy status: Secondary | ICD-10-CM | POA: Diagnosis present

## 2023-05-11 MED ORDER — LUTETIUM LU 177 VIPIVOTIDE TET 1000 MBQ/ML IV SOLN
207.0000 | Freq: Once | INTRAVENOUS | Status: AC
  Administered 2023-05-11: 207 via INTRAVENOUS

## 2023-05-11 MED ORDER — SODIUM CHLORIDE 0.9 % IV BOLUS: 1000.0000 mL | Freq: Once | INTRAVENOUS | Status: DC

## 2023-05-11 MED ORDER — SODIUM CHLORIDE 0.9 % IV SOLN: INTRAVENOUS | Status: AC

## 2023-05-11 NOTE — Written Directive (Addendum)
  PLUVICTO  THERAPY   RADIOPHARMACEUTICAL: Lutetium 177 vipivotide tetraxetan (Pluvicto)     PRESCRIBED DOSE FOR ADMINISTRATION:  200 mCi   ROUTE OFADMINISTRATION:  IV   DIAGNOSIS:  PROSTATE CANCER   REFERRING PHYSICIAN: Jeanie Sewer   TREATMENT #: 1   ADDITIONAL PHYSICIAN COMMENTS/NOTES:   AUTHORIZED USER SIGNATURE & TIME STAMP: Patriciaann Clan, MD   05/11/23    3:24 PM

## 2023-05-11 NOTE — Progress Notes (Signed)
CLINICAL DATA: [89 year old male with castrate resistant prostate carcinoma with bone metastasis.  Patient with increasing PSA and worsening brain metastasis on most recent PSMA PET scan.]  EXAM: NUCLEAR MEDICINE PLUVICTO INJECTION  TECHNIQUE: Infusion: The nuclear medicine technologist and I personally verified the dose activity to be delivered as specified in the written directive, and verified the patient identification via 2 separate methods.  Initial flush of the intravenous catheter was performed was sterile saline. The dose syringe was connected to the catheter and the Lu-177 Pluvicto administered over a 1 to 10 min infusion. Single 10 cc  lushes with normal saline follow the dose. No complications were noted. The entire IV tubing, venocatheter, stopcock and syringes was removed in total, placed in a disposal bag and sent for assay of the residual activity, which will be reported at a later time in our EMR by the physics staff. Pressure was applied to the venipuncture site, and a compression bandage placed. Patient monitored for 1 hour following infusion.    Radiation Safety personnel were present to perform the discharge survey, as detailed on their documentation. After a short period of observation, the patient had his IV removed.  RADIOPHARMACEUTICALS: [Two hundred seven] microcuries Lu-177 PLUVICTO  FINDINGS: Current Infusion: []   Planned Infusions: 6    Patient presented to nuclear medicine for treatment    Risk and benefits explained to patient and patient's niece.  All   questions answered.     Major benefit being progression free survival.  Major risk being myelosuppression and renal toxicity.  Secondary risks include xerostomia.     The patient's most recent blood counts were reviewed and remains a good candidate to proceed with Lu-177 Pluvicto.     Most recent PSA greater than 1,500     The patient was situated in an infusion suite with a contact barrier  placed under the arm. Intravenous access was established, using sterile technique, and a normal saline infusion from a syringe was started.      IMPRESSION: Current Infusion: [1]  Planned Infusions: 6    [The patient tolerated the infusion well. The patient will return in 6 weeks for ongoing care.]

## 2023-05-17 ENCOUNTER — Other Ambulatory Visit: Payer: Self-pay | Admitting: Physician Assistant

## 2023-05-17 DIAGNOSIS — Z192 Hormone resistant malignancy status: Secondary | ICD-10-CM

## 2023-05-18 ENCOUNTER — Inpatient Hospital Stay: Payer: Medicare Other

## 2023-05-18 ENCOUNTER — Inpatient Hospital Stay: Payer: Medicare Other | Admitting: Physician Assistant

## 2023-06-09 ENCOUNTER — Other Ambulatory Visit: Payer: Medicare Other

## 2023-06-09 ENCOUNTER — Ambulatory Visit: Payer: Medicare Other | Admitting: Physician Assistant

## 2023-06-14 ENCOUNTER — Other Ambulatory Visit (HOSPITAL_COMMUNITY): Payer: Self-pay | Admitting: Hematology and Oncology

## 2023-06-14 DIAGNOSIS — C7951 Secondary malignant neoplasm of bone: Secondary | ICD-10-CM

## 2023-06-14 DIAGNOSIS — C61 Malignant neoplasm of prostate: Secondary | ICD-10-CM

## 2023-06-15 ENCOUNTER — Inpatient Hospital Stay: Payer: Medicare Other

## 2023-06-16 NOTE — Written Directive (Cosign Needed)
  PLUVICTO  THERAPY   RADIOPHARMACEUTICAL: Lutetium 177 vipivotide tetraxetan (Pluvicto)     PRESCRIBED DOSE FOR ADMINISTRATION:  200 mCi   ROUTE OFADMINISTRATION:  IV   DIAGNOSIS:  Prostate Cancer    REFERRING PHYSICIAN: Dr. Leonides Schanz    TREATMENT #: 2    ADDITIONAL PHYSICIAN COMMENTS/NOTES:   AUTHORIZED USER SIGNATURE & TIME STAMP: Patriciaann Clan, MD   06/17/23    9:13 AM

## 2023-06-17 ENCOUNTER — Ambulatory Visit (HOSPITAL_COMMUNITY)
Admission: RE | Admit: 2023-06-17 | Discharge: 2023-06-17 | Disposition: A | Discharge status: Home / Self Care | Payer: Medicare Other | Source: Ambulatory Visit | Attending: Hematology and Oncology | Admitting: Hematology and Oncology

## 2023-06-17 VITALS — BP 129/56 | HR 60

## 2023-06-17 DIAGNOSIS — C7952 Secondary malignant neoplasm of bone marrow: Secondary | ICD-10-CM | POA: Insufficient documentation

## 2023-06-17 DIAGNOSIS — C61 Malignant neoplasm of prostate: Secondary | ICD-10-CM | POA: Insufficient documentation

## 2023-06-17 DIAGNOSIS — Z192 Hormone resistant malignancy status: Secondary | ICD-10-CM | POA: Diagnosis present

## 2023-06-17 DIAGNOSIS — C7951 Secondary malignant neoplasm of bone: Secondary | ICD-10-CM | POA: Diagnosis present

## 2023-06-17 LAB — CBC WITH DIFFERENTIAL/PLATELET
Abs Immature Granulocytes: 0.01 10*3/uL (ref 0.00–0.07)
Basophils Absolute: 0 10*3/uL (ref 0.0–0.1)
Basophils Relative: 1 %
Eosinophils Absolute: 0.1 10*3/uL (ref 0.0–0.5)
Eosinophils Relative: 3 %
HCT: 35.6 % — ABNORMAL LOW (ref 39.0–52.0)
Hemoglobin: 11.3 g/dL — ABNORMAL LOW (ref 13.0–17.0)
Immature Granulocytes: 0 %
Lymphocytes Relative: 15 %
Lymphs Abs: 0.6 10*3/uL — ABNORMAL LOW (ref 0.7–4.0)
MCH: 29.6 pg (ref 26.0–34.0)
MCHC: 31.7 g/dL (ref 30.0–36.0)
MCV: 93.2 fL (ref 80.0–100.0)
Monocytes Absolute: 0.4 10*3/uL (ref 0.1–1.0)
Monocytes Relative: 11 %
Neutro Abs: 2.7 10*3/uL (ref 1.7–7.7)
Neutrophils Relative %: 70 %
Platelets: 138 10*3/uL — ABNORMAL LOW (ref 150–400)
RBC: 3.82 MIL/uL — ABNORMAL LOW (ref 4.22–5.81)
RDW: 14.3 % (ref 11.5–15.5)
WBC: 3.9 10*3/uL — ABNORMAL LOW (ref 4.0–10.5)
nRBC: 0 % (ref 0.0–0.2)

## 2023-06-17 LAB — COMPREHENSIVE METABOLIC PANEL
ALT: 12 U/L (ref 0–44)
AST: 23 U/L (ref 15–41)
Albumin: 3.6 g/dL (ref 3.5–5.0)
Alkaline Phosphatase: 34 U/L — ABNORMAL LOW (ref 38–126)
Anion gap: 10 (ref 5–15)
BUN: 27 mg/dL — ABNORMAL HIGH (ref 8–23)
CO2: 26 mmol/L (ref 22–32)
Calcium: 8.9 mg/dL (ref 8.9–10.3)
Chloride: 102 mmol/L (ref 98–111)
Creatinine, Ser: 1.05 mg/dL (ref 0.61–1.24)
GFR, Estimated: >60 mL/min (ref >60)
Glucose, Bld: 100 mg/dL — ABNORMAL HIGH (ref 70–99)
Potassium: 3.6 mmol/L (ref 3.5–5.1)
Sodium: 138 mmol/L (ref 135–145)
Total Bilirubin: 0.8 mg/dL (ref 0.3–1.2)
Total Protein: 6.6 g/dL (ref 6.5–8.1)

## 2023-06-17 LAB — POCT I-STAT CREATININE: Creatinine, Ser: 1.1 mg/dL (ref 0.61–1.24)

## 2023-06-17 LAB — PSA: Prostatic Specific Antigen: 1223.05 ng/mL — ABNORMAL HIGH (ref 0.00–4.00)

## 2023-06-17 MED ORDER — SODIUM CHLORIDE 0.9 % IV BOLUS: 1000.0000 mL | Freq: Once | INTRAVENOUS | Status: DC

## 2023-06-17 MED ORDER — LUTETIUM LU 177 VIPIVOTIDE TET 1000 MBQ/ML IV SOLN
199.6000 | Freq: Once | INTRAVENOUS | Status: AC
  Administered 2023-06-17: 199.6 via INTRAVENOUS

## 2023-06-17 MED ORDER — PIFLIFOLASTAT F 18 (PYLARIFY) INJECTION: 9.0000 | Freq: Once | INTRAVENOUS | Status: DC

## 2023-06-17 NOTE — Progress Notes (Signed)
Called by Radiology to assist in starting IV and getting blood Lab draws.   20 IV started in left FA- Labs drawn and sent to lab- CMP, CBC diff, platlets and PSA.

## 2023-06-17 NOTE — Progress Notes (Signed)
CLINICAL DATA: [89 year old male with widespread metastatic skeletal disease.  Castrate resistant prostate adenocarcinoma.  Positive PSMA PET scan.]  EXAM: NUCLEAR MEDICINE PLUVICTO INJECTION  TECHNIQUE: Infusion: The nuclear medicine technologist and I personally verified the dose activity to be delivered as specified in the written directive, and verified the patient identification via 2 separate methods.  Initial flush of the intravenous catheter was performed was sterile saline. The dose syringe was connected to the catheter and the Lu-177 Pluvicto administered over a 1 to 10 min infusion. Single 10 cc  lushes with normal saline follow the dose. No complications were noted. The entire IV tubing, venocatheter, stopcock and syringes was removed in total, placed in a disposal bag and sent for assay of the residual activity, which will be reported at a later time in our EMR by the physics staff. Pressure was applied to the venipuncture site, and a compression bandage placed. Patient monitored for 1 hour following infusion.   Radiation Safety personnel were present to perform the discharge survey, as detailed on their documentation. After a short period of observation, the patient had his IV removed.  RADIOPHARMACEUTICALS: [199.6] microcuries Lu-177 PLUVICTO  FINDINGS: Current Infusion: [2]  Planned Infusions: 6    Patient presented to nuclear medicine for treatment. The patient's most recent blood counts were reviewed and remains a good candidate to proceed with Lu-177 Pluvicto.  Patient reports no adverse interval affects.  Normal energy.  No complaints.  No bone pain   Relatively stable CBC with mild anemia (hemoglobin equal .3) and platelets reduced at 138 K.  Blood counts evaluated and safe to proceed with second therapy.   Renal function normal.   Positive PSA response with PSA equal 1,223 reduced from 1507  The patient was situated in an infusion suite with a contact barrier  placed under the arm. Intravenous access was established, using sterile technique, and a normal saline infusion from a syringe was started.    IMPRESSION: Current Infusion: [2]  Planned Infusions: 6    Patient to follow-up with oncologist for laboratory draw (CBC, CMP, and PSA in 5 weeks)     [The patient tolerated the infusion well. The patient will return in 6 weeks for ongoing care.]

## 2023-06-18 ENCOUNTER — Inpatient Hospital Stay: Payer: Medicare Other

## 2023-06-18 ENCOUNTER — Inpatient Hospital Stay: Payer: Medicare Other | Admitting: Physician Assistant

## 2023-06-22 ENCOUNTER — Other Ambulatory Visit (HOSPITAL_COMMUNITY): Payer: Medicare Other

## 2023-06-25 ENCOUNTER — Encounter: Payer: Self-pay | Admitting: Hematology and Oncology

## 2023-06-25 ENCOUNTER — Telehealth: Payer: Self-pay | Admitting: Medical Oncology

## 2023-06-25 ENCOUNTER — Inpatient Hospital Stay: Payer: Medicare Other | Attending: Nurse Practitioner | Admitting: Physician Assistant

## 2023-06-25 DIAGNOSIS — C61 Malignant neoplasm of prostate: Secondary | ICD-10-CM

## 2023-06-25 DIAGNOSIS — Z192 Hormone resistant malignancy status: Secondary | ICD-10-CM

## 2023-06-25 NOTE — Progress Notes (Signed)
[facility] [phone]   [phone]  PROGRESS NOTE  Patient Care Team: Lorenda Ishihara, MD as PCP - General (Internal Medicine) Cherlyn Cushing, RN as Oncology Nurse Navigator  I connected with [name]  on 06/25/23 by video visit and verified that I am speaking with the correct person using two identifiers.   I discussed the limitations, risks, security and privacy concerns of performing an evaluation and management service by telemedicine and the availability of in-person appointments. I also discussed with the patient that there may be a patient responsible charge related to this service. The patient expressed understanding and agreed to proceed.  Other persons participating in the visit and their role in the encounter: Patient's cousin, [name]  Patient's location: Patient's home Provider's location: Office  Hematological/Oncological History # Castration-Resistant Advanced Prostate Cancer  11/2021: started eligard and Xtandi 160 mg PO daily.  08/12/2022: last visit with Dr. Clelia Croft 11/11/2022: transition care to Dr. Leonides Schanz  01/20/2022: approximate start date of Zytiga 1000 mg PO daily with prednisone 5 mg 03/01/2023: PET PSMA showed progression of disease of metastatic adenopathy in the LEFT supraclavicular nodal station, LEFT paratracheal nodal station,pelvic adenopathy and retroperitoneal nodal stations.Interval progression of prostate cancer skeletal metastasis with several new lesions.  05/11/2023: Pluvicto treatment (1 out of planned 6) 06/17/2023: Pluvicto treatment (2 out of planned 6)  Interval History:  LUCCIANO Holmes 89 y.o. male with medical history significant for Castration-Resistant Advanced Prostate Cancer  presents for a follow up visit. In the interim, he has completed his 2nd pluvicto treatment on 06/17/2023.   [name] reports having some fatigue but is able to complete his baseline ADLs. He admits that he  is not drinking enough fluids. He denies nausea, vomiting or bowel habit changes. He adds that he takes fiber to have regular bowel movements. He denies overt signs of bleeding including hematochezia or melena. He has noticed some discoloration involving his left forearm after his last Pluvicto infusion. There is no pain, swelling or discharged noted from the left forearm.  He denies any lightheadedness, dizziness, shortness of breath.  He is otherwise stable and denies any fevers, chills, sweats, shortness of breath, chest pain or cough.  Full 10 point ROS is otherwise negative.     MEDICAL HISTORY:  Past Medical History:  Diagnosis Date   Arthritis    knees   Cancer (HCC)    prostate, tx 2005- radiation    Keloid    keloid across chest, pt. unsure of how he encountered it   Memory disorder 11/28/2014   Neuromuscular disorder (HCC)    benign- tremor- occas., treated /w nadolol   Tremor 11/28/2014    SURGICAL HISTORY: Past Surgical History:  Procedure Laterality Date   CATARACT EXTRACTION W/PHACO  09/23/2011   Procedure: CATARACT EXTRACTION PHACO AND INTRAOCULAR LENS PLACEMENT (IOC);  Surgeon: Chalmers Guest, MD;  Location: [facility] OR;  Service: Ophthalmology;  Laterality: Right;   EYE SURGERY Bilateral    L cataract removed & IOL   HERNIA REPAIR     bilateral hernia repair, inguinal, 1980's      SOCIAL HISTORY: Social History   Socioeconomic History   Marital status: Single    Spouse name: Not on file   Number of children: 0   Years of education: 14   Highest education level: Not on file  Occupational History   Occupation: retired  Tobacco Use   Smoking status: Never   Smokeless tobacco: Never  Substance and Sexual  Activity   Alcohol use: No   Drug use: No   Sexual activity: Not on file  Other Topics Concern   Not on file  Social History Narrative   Patient is right handed.   Patient does not drink caffeine.   Social Determinants of Health   Financial Resource Strain: Not  on file  Food Insecurity: Not on file  Transportation Needs: Not on file  Physical Activity: Not on file  Stress: Not on file  Social Connections: Not on file  Intimate Partner Violence: Not on file    FAMILY HISTORY: Family History  Problem Relation Age of Onset   Anesthesia problems Neg Hx    Hypotension Neg Hx    Malignant hyperthermia Neg Hx    Pseudochol deficiency Neg Hx    Cancer Mother    Stroke Father    Lymphoma Brother     ALLERGIES:  has No Known Allergies.  MEDICATIONS:  Current Outpatient Medications  Medication Sig Dispense Refill   ondansetron (ZOFRAN) 4 MG tablet Take 4 mg by mouth as needed for nausea or vomiting.     abiraterone acetate (ZYTIGA) 250 MG tablet Take 4 tablets (1,000 mg total) by mouth daily. Take on an empty stomach 1 hour before or 2 hours after a meal 120 tablet 0   amLODipine (NORVASC) 5 MG tablet Take 5 mg by mouth daily.     escitalopram (LEXAPRO) 10 MG tablet Take 10 mg by mouth daily.     metoprolol succinate (TOPROL-XL) 100 MG 24 hr tablet Take 100 mg by mouth daily.     Multiple Vitamins-Minerals (MULTIVITAMINS THER. W/MINERALS) TABS Take 1 tablet by mouth daily.       MYRBETRIQ 25 MG TB24 tablet Take 25 mg by mouth daily.     predniSONE (DELTASONE) 5 MG tablet Take 1 tablet (5 mg total) by mouth daily with breakfast. 90 tablet 1   ZYLET 0.5-0.3 % SUSP Apply to eye.     No current facility-administered medications for this visit.    REVIEW OF SYSTEMS:   Constitutional: ( - ) fevers, ( - )  chills , ( - ) night sweats Eyes: ( - ) blurriness of vision, ( - ) double vision, ( - ) watery eyes Ears, nose, mouth, throat, and face: ( - ) mucositis, ( - ) sore throat Respiratory: ( - ) cough, ( - ) dyspnea, ( - ) wheezes Cardiovascular: ( - ) palpitation, ( - ) chest discomfort, ( - ) lower extremity swelling Gastrointestinal:  ( - ) nausea, ( - ) heartburn, ( - ) change in bowel habits Skin: ( - ) abnormal skin rashes Lymphatics: ( -  ) new lymphadenopathy, ( - ) easy bruising Neurological: ( - ) numbness, ( - ) tingling, ( - ) new weaknesses Behavioral/Psych: ( - ) mood change, ( - ) new changes  All other systems were reviewed with the patient and are negative.  PHYSICAL EXAMINATION: Unable to perform due to virtual visit.   LABORATORY DATA:  I have reviewed the data as listed    Latest Ref Rng & Units 06/17/2023   12:02 PM 05/04/2023    1:53 PM 04/09/2023    3:03 PM  CBC  WBC 4.0 - 10.5 K/uL 3.9  5.4  5.5   Hemoglobin 13.0 - 17.0 g/dL 16.1  09.6  04.5   Hematocrit 39.0 - 52.0 % 35.6  37.7  34.9   Platelets 150 - 400 K/uL 138  199  178  Latest Ref Rng & Units 06/17/2023   12:05 PM 06/17/2023   12:02 PM 05/04/2023    1:53 PM  CMP  Glucose 70 - 99 mg/dL  161  096   BUN 8 - 23 mg/dL  27  29   Creatinine 0.45 - 1.24 mg/dL 4.09  8.11  9.14   Sodium 135 - 145 mmol/L  138  139   Potassium 3.5 - 5.1 mmol/L  3.6  4.0   Chloride 98 - 111 mmol/L  102  105   CO2 22 - 32 mmol/L  26  29   Calcium 8.9 - 10.3 mg/dL  8.9  9.0   Total Protein 6.5 - 8.1 g/dL  6.6  7.0   Total Bilirubin 0.3 - 1.2 mg/dL  0.8  0.6   Alkaline Phos 38 - 126 U/L  34  33   AST 15 - 41 U/L  23  19   ALT 0 - 44 U/L  12  9     RADIOGRAPHIC STUDIES: NM PLUVICTO ADMINISTRATION  Result Date: 06/17/2023 CLINICAL DATA:  89 year old male with widespread metastatic skeletal disease. Castrate resistant prostate adenocarcinoma. Positive PSMA PET scan. EXAM: NUCLEAR MEDICINE PLUVICTO INJECTION TECHNIQUE: Infusion: The nuclear medicine technologist and I personally verified the dose activity to be delivered as specified in the written directive, and verified the patient identification via 2 separate methods. Initial flush of the intravenous catheter was performed was sterile saline. The dose syringe was connected to the catheter and the Lu-177 Pluvicto administered over a 1 to 10 min infusion. Single 10 cc lushes with normal saline follow the dose. No  complications were noted. The entire IV tubing, venocatheter, stopcock and syringes was removed in total, placed in a disposal bag and sent for assay of the residual activity, which will be reported at a later time in our EMR by the physics staff. Pressure was applied to the venipuncture site, and a compression bandage placed. Patient monitored for 1 hour following infusion. Radiation Safety personnel were present to perform the discharge survey, as detailed on their documentation. After a short period of observation, the patient had his IV removed. RADIOPHARMACEUTICALS:  199.6 microcuries Lu-177 PLUVICTO FINDINGS: Current Infusion: 2 Planned Infusions: 6 Patient presented to nuclear medicine for treatment. The patient's most recent blood counts were reviewed and remains a good candidate to proceed with Lu-177 Pluvicto. Patient reports no adverse interval affects. Normal energy. No complaints. No bone pain Relatively stable CBC with mild anemia (hemoglobin equal limb 0.3) and platelets reduced at 138. Blood counts evaluated safe to proceed with second therapy. Renal function normal. Positive PSA response with PSA equal 1,223 reduced from 1507 The patient was situated in an infusion suite with a contact barrier placed under the arm. Intravenous access was established, using sterile technique, and a normal saline infusion from a syringe was started. Micro-dosimetry: The prescribed radiation activity was assayed and confirmed to be within specified tolerance. IMPRESSION: Current Infusion: 2 Planned Infusions: 6 Patient to follow-up with oncologist for laboratory draw (CBC, CMP, and PSA in 5 weeks) The patient tolerated the infusion well. The patient will return in 6 weeks for ongoing care. Electronically Signed   By: Genevive Bi M.D.   On: 06/17/2023 15:23    ASSESSMENT & PLAN EDGER HUSAIN 89 y.o. male with medical history significant for Castration-Resistant Advanced Prostate Cancer  presents for a follow  up visit.   # Castration-Resistant Advanced Prostate Cancer  --Continue Eligard therapy with urology. --Currently receiving Pluvicto  therapy with 2nd infusion on 06/17/2023.   --Labs from 06/17/2023 showed WBC 3.9, Hgb 11.3, Plt 138K, Creatinine and LFTs normal. PSA improving to 1223.05 (previously 1507.0) --Next pluvicto treatment scheduled on 07/29/2023. To minimize multiple trips, patient has requested to schedule labs with NM prior to infusion.  -- Return to clinic in 4 weeks time for toxicity check.   #Left forearm discoloration: --Could be some bruising since patient has mild thrombocytopenia.  --Advised to follow up with NM team to rule out any reaction to the infusion after IV access.   No orders of the defined types were placed in this encounter.  All questions were answered. The patient knows to call the clinic with any problems, questions or concerns.  A total of more than 25 minutes were spent on this encounter with non-face-to-face time, including preparing to see the patient, ordering tests and/or medications, counseling the patient and coordination of care as outlined above.   Georga Kaufmann PA-C Dept of Hematology and Oncology [facility] Phone: [phone]   06/25/2023 4:56 PM

## 2023-06-25 NOTE — Telephone Encounter (Signed)
Pt and his cousin were instructed to stop zytiga and prednisone. I also told them to call central scheduling re appt time for pluvicto. Pt images of forearm sent to PA in nuc med.

## 2023-06-25 NOTE — Progress Notes (Signed)
Pt tolerated treatment well.  IV removed and gauze with coban applied. Site WNL.

## 2023-06-25 NOTE — Telephone Encounter (Signed)
I talked to pt cousin and told her to call central scheduling to see about changing time for 11/11 nuc med  ( pt requested later time. )

## 2023-06-29 ENCOUNTER — Encounter: Payer: Self-pay | Admitting: Hematology and Oncology

## 2023-07-01 IMAGING — NM NM BONE WHOLE BODY
2 series · 2 of 2 positions shown · non-contrast
Comparison: 05/30/2019

CLINICAL DATA: Prostate cancer

EXAM:
NUCLEAR MEDICINE WHOLE BODY BONE SCAN
TECHNIQUE: Whole body anterior and posterior images were obtained approximately
3 hours after intravenous injection of radiopharmaceutical.
RADIOPHARMACEUTICALS:  21.7 mCi Fechnetium-PPm MDP IV

[Series 1: wbr_bone_40 whole body · 2.66mm/px · 1 of 1 slices shown (1 of 2)]
[im 1/1]
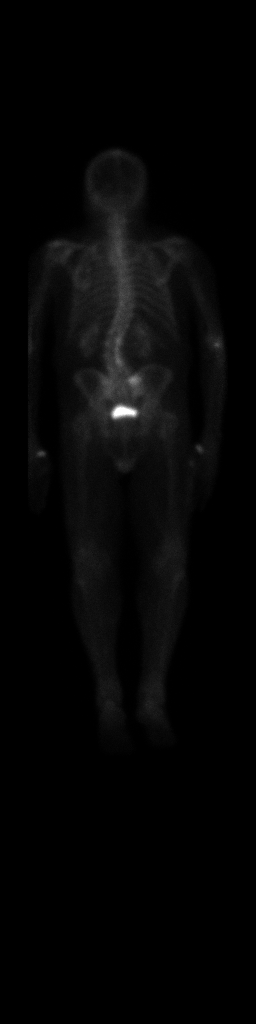

[Series 1: wbr_bone_40 whole body · 2.66mm/px · 1 of 1 slices shown (2 of 2)]
[im 1/1]
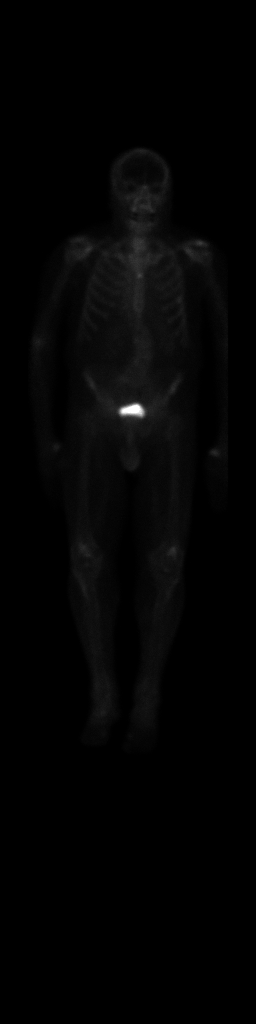

[2 of 2 positions shown; findings below may reference images not displayed]

FINDINGS: Since the prior examination, there has developed intense focal
uptake at the sternomanubrial junction. While this may be
degenerative in nature, focal metastatic disease is difficult to
exclude. Additionally, there is focal uptake within the left fifth
rib laterally, new since prior examination and suspicious for focal
metastatic disease. Uptake is again seen along the concavity of a
lumbar levoscoliosis, degenerative in nature. There is, however,
intense focal uptake within the right sacral ala corresponding to
the focal lesion noted on accompanying CT examination the abdomen
pelvis on 10/15/2021 suspicious for development of an osseous
metastasis.
IMPRESSION: Focal uptake within the sternum at the sternomanubrial junction,
within the left fifth rib laterally, and within the right sacral
ala, the latter of which corresponds to the sclerotic lesion seen on
accompanying CT examination all suspicious for osseous metastatic
disease.

## 2023-07-29 ENCOUNTER — Ambulatory Visit (HOSPITAL_COMMUNITY)
Admission: RE | Admit: 2023-07-29 | Discharge: 2023-07-29 | Disposition: A | Discharge status: Home / Self Care | Payer: Medicare Other | Source: Ambulatory Visit | Attending: Hematology and Oncology | Admitting: Hematology and Oncology

## 2023-07-29 DIAGNOSIS — C61 Malignant neoplasm of prostate: Secondary | ICD-10-CM | POA: Insufficient documentation

## 2023-07-29 DIAGNOSIS — Z192 Hormone resistant malignancy status: Secondary | ICD-10-CM | POA: Diagnosis present

## 2023-07-29 LAB — COMPREHENSIVE METABOLIC PANEL
ALT: 9 U/L (ref 0–44)
AST: 23 U/L (ref 15–41)
Albumin: 3.6 g/dL (ref 3.5–5.0)
Alkaline Phosphatase: 49 U/L (ref 38–126)
Anion gap: 9 (ref 5–15)
BUN: 26 mg/dL — ABNORMAL HIGH (ref 8–23)
CO2: 26 mmol/L (ref 22–32)
Calcium: 9.3 mg/dL (ref 8.9–10.3)
Chloride: 103 mmol/L (ref 98–111)
Creatinine, Ser: 0.73 mg/dL (ref 0.61–1.24)
GFR, Estimated: >60 mL/min (ref >60)
Glucose, Bld: 110 mg/dL — ABNORMAL HIGH (ref 70–99)
Potassium: 3.8 mmol/L (ref 3.5–5.1)
Sodium: 138 mmol/L (ref 135–145)
Total Bilirubin: 0.7 mg/dL (ref <1.2)
Total Protein: 7.2 g/dL (ref 6.5–8.1)

## 2023-07-29 LAB — PSA: Prostatic Specific Antigen: 971.88 ng/mL — ABNORMAL HIGH (ref 0.00–4.00)

## 2023-07-29 MED ORDER — LUTETIUM LU 177 VIPIVOTIDE TET 1000 MBQ/ML IV SOLN
200.0000 | Freq: Once | INTRAVENOUS | Status: AC
  Administered 2023-07-29: 203.7 via INTRAVENOUS

## 2023-07-29 MED ORDER — SODIUM CHLORIDE 0.9 % IV BOLUS: 1000.0000 mL | Freq: Once | INTRAVENOUS | Status: DC

## 2023-07-29 NOTE — Addendum Note (Signed)
Encounter addended by: Donato Heinz, RT on: 07/29/2023 1:23 PM  Actions taken: Imaging Exam ended

## 2023-07-29 NOTE — Progress Notes (Signed)
CLINICAL DATA: [89 year old male with castrate resistant metastatic prostate cancer.  Multiple skeletal metastasis.]    EXAM: NUCLEAR MEDICINE PLUVICTO INJECTION  TECHNIQUE: Infusion: The nuclear medicine technologist and I personally verified the dose activity to be delivered as specified in the written directive, and verified the patient identification via 2 separate methods.  Initial flush of the intravenous catheter was performed was sterile saline. The dose syringe was connected to the catheter and the Lu-177 Pluvicto administered over a 1 to 10 min infusion. Single 10 cc  lushes with normal saline follow the dose. No complications were noted. The entire IV tubing, venocatheter, stopcock and syringes was removed in total, placed in a disposal bag and sent for assay of the residual activity, which will be reported at a later time in our EMR by the physics staff. Pressure was applied to the venipuncture site, and a compression bandage placed. Patient monitored for 1 hour following infusion.    Radiation Safety personnel were present to perform the discharge survey, as detailed on their documentation. After a short period of observation, the patient had his IV removed.  RADIOPHARMACEUTICALS: [203.7] microcuries Lu-177 PLUVICTO  FINDINGS: Current Infusion: [3]  Planned Infusions: 6    Patient presented to nuclear medicine for treatment.  Patient reports no interval adverse events other than some bleeding at IV access site same day as treatment.     The patient's most recent blood counts were reviewed and remains a good candidate to proceed with Lu-177 Pluvicto.     Normal renal function.  PSA and CBC results delayed.           The patient was situated in an infusion suite with a contact barrier placed under the arm. Intravenous access was established, using sterile technique, and a normal saline infusion from a syringe was started.     Micro-dosimetry: The prescribed  radiation activity was assayed and confirmed to be within specified tolerance.  IMPRESSION: Current Infusion: [3]  Planned Infusions: 6    [The patient tolerated the infusion well. The patient will return in 6 weeks for ongoing care.]

## 2023-07-29 NOTE — Written Directive (Addendum)
  PLUVICTO  THERAPY   RADIOPHARMACEUTICAL: Lutetium 177 vipivotide tetraxetan (Pluvicto)     PRESCRIBED DOSE FOR ADMINISTRATION:  200 mCi   ROUTE OFADMINISTRATION:  IV   DIAGNOSIS: prostate cancer, Metastatic castration-resistant adenocarcinoma of prostate    REFERRING PHYSICIAN:Dorsey, Thereasa Distance, MD    TREATMENT #:3   ADDITIONAL PHYSICIAN COMMENTS/NOTES:   AUTHORIZED USER SIGNATURE & TIME STAMP: Patriciaann Clan, MD   07/29/23    9:29 AM

## 2023-08-05 ENCOUNTER — Other Ambulatory Visit (HOSPITAL_COMMUNITY): Payer: Medicare Other

## 2023-08-06 ENCOUNTER — Other Ambulatory Visit (HOSPITAL_COMMUNITY): Payer: Self-pay | Admitting: Internal Medicine

## 2023-08-06 DIAGNOSIS — S060X0S Concussion without loss of consciousness, sequela: Secondary | ICD-10-CM

## 2023-08-09 ENCOUNTER — Ambulatory Visit (HOSPITAL_COMMUNITY)
Admission: RE | Admit: 2023-08-09 | Discharge: 2023-08-09 | Disposition: A | Discharge status: Home / Self Care | Payer: Medicare Other | Source: Ambulatory Visit | Attending: Internal Medicine | Admitting: Internal Medicine

## 2023-08-09 DIAGNOSIS — S060X0S Concussion without loss of consciousness, sequela: Secondary | ICD-10-CM | POA: Insufficient documentation

## 2023-09-14 ENCOUNTER — Encounter (HOSPITAL_COMMUNITY)
Admission: RE | Admit: 2023-09-14 | Discharge: 2023-09-14 | Disposition: A | Discharge status: Home / Self Care | Payer: Medicare Other | Source: Ambulatory Visit | Attending: Hematology and Oncology | Admitting: Hematology and Oncology

## 2023-09-14 VITALS — BP 128/62 | HR 60

## 2023-09-14 DIAGNOSIS — C61 Malignant neoplasm of prostate: Secondary | ICD-10-CM | POA: Diagnosis present

## 2023-09-14 DIAGNOSIS — Z192 Hormone resistant malignancy status: Secondary | ICD-10-CM | POA: Insufficient documentation

## 2023-09-14 LAB — CBC WITH DIFFERENTIAL/PLATELET
Abs Immature Granulocytes: 0.01 10*3/uL (ref 0.00–0.07)
Basophils Absolute: 0 10*3/uL (ref 0.0–0.1)
Basophils Relative: 1 %
Eosinophils Absolute: 0.1 10*3/uL (ref 0.0–0.5)
Eosinophils Relative: 2 %
HCT: 34.4 % — ABNORMAL LOW (ref 39.0–52.0)
Hemoglobin: 11.2 g/dL — ABNORMAL LOW (ref 13.0–17.0)
Immature Granulocytes: 0 %
Lymphocytes Relative: 12 %
Lymphs Abs: 0.5 10*3/uL — ABNORMAL LOW (ref 0.7–4.0)
MCH: 30.1 pg (ref 26.0–34.0)
MCHC: 32.6 g/dL (ref 30.0–36.0)
MCV: 92.5 fL (ref 80.0–100.0)
Monocytes Absolute: 0.4 10*3/uL (ref 0.1–1.0)
Monocytes Relative: 11 %
Neutro Abs: 3 10*3/uL (ref 1.7–7.7)
Neutrophils Relative %: 74 %
Platelets: 149 10*3/uL — ABNORMAL LOW (ref 150–400)
RBC: 3.72 MIL/uL — ABNORMAL LOW (ref 4.22–5.81)
RDW: 13.8 % (ref 11.5–15.5)
WBC: 4.1 10*3/uL (ref 4.0–10.5)
nRBC: 0 % (ref 0.0–0.2)

## 2023-09-14 LAB — COMPREHENSIVE METABOLIC PANEL
ALT: 11 U/L (ref 0–44)
AST: 23 U/L (ref 15–41)
Albumin: 3.9 g/dL (ref 3.5–5.0)
Alkaline Phosphatase: 65 U/L (ref 38–126)
Anion gap: 7 (ref 5–15)
BUN: 29 mg/dL — ABNORMAL HIGH (ref 8–23)
CO2: 25 mmol/L (ref 22–32)
Calcium: 9.2 mg/dL (ref 8.9–10.3)
Chloride: 104 mmol/L (ref 98–111)
Creatinine, Ser: 1.11 mg/dL (ref 0.61–1.24)
GFR, Estimated: >60 mL/min (ref >60)
Glucose, Bld: 98 mg/dL (ref 70–99)
Potassium: 3.6 mmol/L (ref 3.5–5.1)
Sodium: 136 mmol/L (ref 135–145)
Total Bilirubin: 0.7 mg/dL (ref 0.0–1.2)
Total Protein: 7.5 g/dL (ref 6.5–8.1)

## 2023-09-14 LAB — PSA: Prostatic Specific Antigen: 825.25 ng/mL — ABNORMAL HIGH (ref 0.00–4.00)

## 2023-09-14 MED ORDER — LUTETIUM LU 177 VIPIVOTIDE TET 1000 MBQ/ML IV SOLN
203.0000 | Freq: Once | INTRAVENOUS | Status: AC
  Administered 2023-09-14: 203 via INTRAVENOUS

## 2023-09-14 MED ORDER — SODIUM CHLORIDE 0.9 % IV SOLN: INTRAVENOUS | Status: AC

## 2023-09-14 MED ORDER — SODIUM CHLORIDE 0.9 % IV BOLUS: 1000.0000 mL | Freq: Once | INTRAVENOUS | Status: DC

## 2023-09-14 NOTE — Progress Notes (Signed)
 IV infiltrated small amount of swelling  noted.  Instructed patient to leaving dressing in place for 20 minutes

## 2023-09-14 NOTE — Progress Notes (Signed)
 CLINICAL DATA: [Prostate cancer with bone metastasis.  Castrate resistant metastatic prostate cancer with PSMA avid skeletal lesions.]    EXAM: NUCLEAR MEDICINE PLUVICTO  INJECTION  TECHNIQUE: Infusion: The nuclear medicine technologist and I personally verified the dose activity to be delivered as specified in the written directive, and verified the patient identification via 2 separate methods.  Initial flush of the intravenous catheter was performed was sterile saline. The dose syringe was connected to the catheter and the Lu-177 Pluvicto  administered over a 1 to 10 min infusion. Single 10 cc  lushes with normal saline follow the dose. No complications were noted. The entire IV tubing, venocatheter, stopcock and syringes was removed in total, placed in a disposal bag and sent for assay of the residual activity, which will be reported at a later time in our EMR by the physics staff. Pressure was applied to the venipuncture site, and a compression bandage placed. Patient monitored for 1 hour following infusion.    Radiation Safety personnel were present to perform the discharge survey, as detailed on their documentation. After a short period of observation, the patient had his IV removed.  RADIOPHARMACEUTICALS: [Two hundred three] microcuries Lu-177 PLUVICTO   FINDINGS: Current Infusion: [4]  Planned Infusions: 6    Patient presented to nuclear medicine for treatment. The patient's most recent blood counts were reviewed and remains a good candidate to proceed with Lu-177 Pluvicto .    No mild suppression, renal toxicity or hepatic toxicity.     PSA equal 825 decreased from 971 1 month prior and 1223 2 months prior.     Patient reports no interval adverse effects.       The patient was situated in an infusion suite with a contact barrier placed under the arm. Intravenous access was established, using sterile technique, and a normal saline infusion from a syringe was started.      Micro-dosimetry: The prescribed radiation activity was assayed and confirmed to be within specified tolerance.  IMPRESSION: Current Infusion: [4]  Planned Infusions: 6    [The patient tolerated the infusion well. The patient will return in 6 weeks ongoing care.]

## 2023-09-14 NOTE — Written Directive (Addendum)
  PLUVICTO   THERAPY   RADIOPHARMACEUTICAL: Lutetium 177 vipivotide tetraxetan (Pluvicto )     PRESCRIBED DOSE FOR ADMINISTRATION:  200 mCi   ROUTE OFADMINISTRATION:  IV   DIAGNOSIS:  Prostate cancer   REFERRING PHYSICIAN: Norleen Kidney   TREATMENT #: 4   ADDITIONAL PHYSICIAN COMMENTS/NOTES:   AUTHORIZED USER SIGNATURE & TIME STAMP: Norleen GORMAN Boxer, MD   09/14/23    10:40 AM

## 2023-09-17 ENCOUNTER — Encounter: Payer: Self-pay | Admitting: Hematology and Oncology

## 2023-09-23 ENCOUNTER — Telehealth: Payer: Self-pay | Admitting: Physician Assistant

## 2023-09-23 NOTE — Telephone Encounter (Signed)
 Scheduled appointments per scheduling message. Patient is aware of the appointments made and will be mailed an appointment reminder.

## 2023-10-25 ENCOUNTER — Other Ambulatory Visit: Payer: Self-pay | Admitting: Physician Assistant

## 2023-10-25 DIAGNOSIS — Z192 Hormone resistant malignancy status: Secondary | ICD-10-CM

## 2023-10-27 ENCOUNTER — Inpatient Hospital Stay: Payer: Medicare Other | Attending: Physician Assistant | Admitting: Physician Assistant

## 2023-10-27 ENCOUNTER — Inpatient Hospital Stay: Payer: Medicare Other

## 2023-10-27 VITALS — BP 107/52 | HR 60 | Temp 97.2°F | Resp 15 | Wt 122.5 lb

## 2023-10-27 DIAGNOSIS — C7951 Secondary malignant neoplasm of bone: Secondary | ICD-10-CM | POA: Insufficient documentation

## 2023-10-27 DIAGNOSIS — Z192 Hormone resistant malignancy status: Secondary | ICD-10-CM | POA: Diagnosis not present

## 2023-10-27 DIAGNOSIS — C61 Malignant neoplasm of prostate: Secondary | ICD-10-CM | POA: Diagnosis present

## 2023-10-27 LAB — CBC WITH DIFFERENTIAL (CANCER CENTER ONLY)
Abs Immature Granulocytes: 0 10*3/uL (ref 0.00–0.07)
Basophils Absolute: 0 10*3/uL (ref 0.0–0.1)
Basophils Relative: 1 %
Eosinophils Absolute: 0.1 10*3/uL (ref 0.0–0.5)
Eosinophils Relative: 2 %
HCT: 32.7 % — ABNORMAL LOW (ref 39.0–52.0)
Hemoglobin: 10.6 g/dL — ABNORMAL LOW (ref 13.0–17.0)
Immature Granulocytes: 0 %
Lymphocytes Relative: 16 %
Lymphs Abs: 0.5 10*3/uL — ABNORMAL LOW (ref 0.7–4.0)
MCH: 29.9 pg (ref 26.0–34.0)
MCHC: 32.4 g/dL (ref 30.0–36.0)
MCV: 92.4 fL (ref 80.0–100.0)
Monocytes Absolute: 0.3 10*3/uL (ref 0.1–1.0)
Monocytes Relative: 11 %
Neutro Abs: 2.1 10*3/uL (ref 1.7–7.7)
Neutrophils Relative %: 70 %
Platelet Count: 126 10*3/uL — ABNORMAL LOW (ref 150–400)
RBC: 3.54 MIL/uL — ABNORMAL LOW (ref 4.22–5.81)
RDW: 13.6 % (ref 11.5–15.5)
WBC Count: 3 10*3/uL — ABNORMAL LOW (ref 4.0–10.5)
nRBC: 0 % (ref 0.0–0.2)

## 2023-10-27 LAB — CMP (CANCER CENTER ONLY)
ALT: 7 U/L (ref 0–44)
AST: 19 U/L (ref 15–41)
Albumin: 3.7 g/dL (ref 3.5–5.0)
Alkaline Phosphatase: 61 U/L (ref 38–126)
Anion gap: 3 — ABNORMAL LOW (ref 5–15)
BUN: 29 mg/dL — ABNORMAL HIGH (ref 8–23)
CO2: 30 mmol/L (ref 22–32)
Calcium: 9.1 mg/dL (ref 8.9–10.3)
Chloride: 104 mmol/L (ref 98–111)
Creatinine: 1.07 mg/dL (ref 0.61–1.24)
GFR, Estimated: >60 mL/min (ref >60)
Glucose, Bld: 94 mg/dL (ref 70–99)
Potassium: 4.2 mmol/L (ref 3.5–5.1)
Sodium: 137 mmol/L (ref 135–145)
Total Bilirubin: 0.5 mg/dL (ref 0.0–1.2)
Total Protein: 7.1 g/dL (ref 6.5–8.1)

## 2023-10-27 NOTE — Progress Notes (Signed)
 [facility] [phone]   [phone]  PROGRESS NOTE  Patient Care Team: Lorenda Ishihara, MD as PCP - General (Internal Medicine) Cherlyn Cushing, RN as Oncology Nurse Navigator  Hematological/Oncological History # Castration-Resistant Advanced Prostate Cancer  11/2021: started eligard and Xtandi 160 mg PO daily.  08/12/2022: last visit with Dr. Clelia Croft 11/11/2022: transition care to Dr. Leonides Schanz  01/20/2022: approximate start date of Zytiga 1000 mg PO daily with prednisone 5 mg 03/01/2023: PET PSMA showed progression of disease of metastatic adenopathy in the LEFT supraclavicular nodal station, LEFT paratracheal nodal station,pelvic adenopathy and retroperitoneal nodal stations.Interval progression of prostate cancer skeletal metastasis with several new lesions.  05/11/2023: Pluvicto treatment (1 out of planned 6) 06/17/2023: Pluvicto treatment (2 out of planned 6) 07/29/2023: Pluvicto treatment (3 out of planned 6) 09/14/2023: Pluvicto treatment (4 out of planned 6)  Interval History:  Todd Holmes 89 y.o. male with medical history significant for Castration-Resistant Advanced Prostate Cancer  presents for a follow up visit. In the interim, he has completed his 4th pluvicto treatment on 09/14/2023. He is accompanied by his cousin for this visit.    Todd Holmes reports his energy levels are fairly stable. He does have some baseline fatigue that has not worsened with Pluvicto therapy. He is able to complete his basic ADLs on his own. He reports he eats up to two per day but has lost some weight over the past few months. He denies nausea, vomiting or bowel habit changes. He denies fevers, chills, sweats, shortness of breath, chest pain, cough, headaches or dizziness. He has no other symptoms. Full 10 point ROS is otherwise negative.     MEDICAL HISTORY:  Past Medical History:  Diagnosis Date   Arthritis    knees   Cancer (HCC)    prostate, tx 2005-  radiation    Keloid    keloid across chest, pt. unsure of how he encountered it   Memory disorder 11/28/2014   Neuromuscular disorder (HCC)    benign- tremor- occas., treated /w nadolol   Tremor 11/28/2014    SURGICAL HISTORY: Past Surgical History:  Procedure Laterality Date   CATARACT EXTRACTION W/PHACO  09/23/2011   Procedure: CATARACT EXTRACTION PHACO AND INTRAOCULAR LENS PLACEMENT (IOC);  Surgeon: Chalmers Guest, MD;  Location: [facility] OR;  Service: Ophthalmology;  Laterality: Right;   EYE SURGERY Bilateral    L cataract removed & IOL   HERNIA REPAIR     bilateral hernia repair, inguinal, 1980's      SOCIAL HISTORY: Social History   Socioeconomic History   Marital status: Single    Spouse name: Not on file   Number of children: 0   Years of education: 14   Highest education level: Not on file  Occupational History   Occupation: retired  Tobacco Use   Smoking status: Never   Smokeless tobacco: Never  Substance and Sexual Activity   Alcohol use: No   Drug use: No   Sexual activity: Not on file  Other Topics Concern   Not on file  Social History Narrative   Patient is right handed.   Patient does not drink caffeine.   Social Drivers of Corporate investment banker Strain: Not on file  Food Insecurity: Not on file  Transportation Needs: Not on file  Physical Activity: Not on file  Stress: Not on file  Social Connections: Not on file  Intimate Partner Violence: Not on file    FAMILY HISTORY: Family History  Problem Relation  Age of Onset   Anesthesia problems Neg Hx    Hypotension Neg Hx    Malignant hyperthermia Neg Hx    Pseudochol deficiency Neg Hx    Cancer Mother    Stroke Father    Lymphoma Brother     ALLERGIES:  has no known allergies.  MEDICATIONS:  Current Outpatient Medications  Medication Sig Dispense Refill   amLODipine (NORVASC) 5 MG tablet Take 5 mg by mouth daily.     escitalopram (LEXAPRO) 10 MG tablet Take 10 mg by mouth daily.      metoprolol succinate (TOPROL-XL) 100 MG 24 hr tablet Take 100 mg by mouth daily.     Multiple Vitamins-Minerals (MULTIVITAMINS THER. W/MINERALS) TABS Take 1 tablet by mouth daily.       MYRBETRIQ 25 MG TB24 tablet Take 25 mg by mouth daily.     ZYLET 0.5-0.3 % SUSP Apply to eye.     ondansetron (ZOFRAN) 4 MG tablet Take 4 mg by mouth as needed for nausea or vomiting. (Patient not taking: Reported on 10/27/2023)     No current facility-administered medications for this visit.    REVIEW OF SYSTEMS:   Constitutional: ( - ) fevers, ( - )  chills , ( - ) night sweats Eyes: ( - ) blurriness of vision, ( - ) double vision, ( - ) watery eyes Ears, nose, mouth, throat, and face: ( - ) mucositis, ( - ) sore throat Respiratory: ( - ) cough, ( - ) dyspnea, ( - ) wheezes Cardiovascular: ( - ) palpitation, ( - ) chest discomfort, ( - ) lower extremity swelling Gastrointestinal:  ( - ) nausea, ( - ) heartburn, ( - ) change in bowel habits Skin: ( - ) abnormal skin rashes Lymphatics: ( - ) new lymphadenopathy, ( - ) easy bruising Neurological: ( - ) numbness, ( - ) tingling, ( - ) new weaknesses Behavioral/Psych: ( - ) mood change, ( - ) new changes  All other systems were reviewed with the patient and are negative.  PHYSICAL EXAMINATION: Vitals:   10/27/23 1336  BP: (!) 107/52  Pulse: 60  Resp: 15  Temp: (!) 97.2 F (36.2 C)  SpO2: 98%    Constitutional: Oriented to person, place, and time and well-developed, well-nourished, and in no distress.  HENT:  Head: Normocephalic and atraumatic.  Eyes: Conjunctivae are normal. Right eye exhibits no discharge. Left eye exhibits no discharge. No scleral icterus.  Cardiovascular: Normal rate, regular rhythm, normal heart sounds Pulmonary/Chest: Effort normal and breath sounds normal. No respiratory distress. No wheezes. No rales.  Musculoskeletal: Normal range of motion. Exhibits no edema.  Neurological: Alert and oriented to person, place, and time.  Exhibits normal muscle tone. Gait normal. Coordination normal.  Skin: Skin is warm and dry. No rash noted. Not diaphoretic. No erythema. No pallor.  Psychiatric: Mood, memory and judgment normal.    LABORATORY DATA:  I have reviewed the data as listed    Latest Ref Rng & Units 10/27/2023    1:03 PM 09/14/2023   11:32 AM 06/17/2023   12:02 PM  CBC  WBC 4.0 - 10.5 K/uL 3.0  4.1  3.9   Hemoglobin 13.0 - 17.0 g/dL 16.1  09.6  04.5   Hematocrit 39.0 - 52.0 % 32.7  34.4  35.6   Platelets 150 - 400 K/uL 126  149  138        Latest Ref Rng & Units 10/27/2023    1:03 PM 09/14/2023  11:32 AM 07/29/2023   11:40 AM  CMP  Glucose 70 - 99 mg/dL 94  98  409   BUN 8 - 23 mg/dL 29  29  26    Creatinine 0.61 - 1.24 mg/dL 8.11  9.14  7.82   Sodium 135 - 145 mmol/L 137  136  138   Potassium 3.5 - 5.1 mmol/L 4.2  3.6  3.8   Chloride 98 - 111 mmol/L 104  104  103   CO2 22 - 32 mmol/L 30  25  26    Calcium 8.9 - 10.3 mg/dL 9.1  9.2  9.3   Total Protein 6.5 - 8.1 g/dL 7.1  7.5  7.2   Total Bilirubin 0.0 - 1.2 mg/dL 0.5  0.7  0.7   Alkaline Phos 38 - 126 U/L 61  65  49   AST 15 - 41 U/L 19  23  23    ALT 0 - 44 U/L 7  11  9      RADIOGRAPHIC STUDIES: No results found.  ASSESSMENT & PLAN YOUSAF SAINATO is a 89 y.o. male with medical history significant for Castration-Resistant Advanced Prostate Cancer  presents for a follow up visit.   # Castration-Resistant Advanced Prostate Cancer  --Continue Eligard therapy with urology. --Currently receiving Pluvicto therapy with 4th infusion on 09/14/2023.   --Labs from 06/17/2023 showed WBC 3.0, Hgb 10.6, Plt 126K, Creatinine and LFTs normal. PSA level pending. Last PSA from 09/14/2023 was 825.25 (previously 971.88) --Next pluvicto treatment scheduled on 11/02/2023.  -- Return to clinic 1 week before 6th Pluvicto treatment.   No orders of the defined types were placed in this encounter.  All questions were answered. The patient knows to call the clinic  with any problems, questions or concerns.   I have spent a total of 30 minutes minutes of face-to-face and non-face-to-face time, preparing to see the patient, performing a medically appropriate examination, counseling and educating the patient,  documenting clinical information in the electronic health record, independently interpreting results and communicating results to the patient, and care coordination.    Georga Kaufmann PA-C Dept of Hematology and Oncology [facility] at [facility] Phone: [phone]   10/27/2023 3:50 PM

## 2023-10-28 ENCOUNTER — Other Ambulatory Visit (HOSPITAL_COMMUNITY): Payer: Medicare Other

## 2023-10-28 LAB — TESTOSTERONE: Testosterone: <3 ng/dL — ABNORMAL LOW (ref 264–916)

## 2023-10-29 LAB — PROSTATE-SPECIFIC AG, SERUM (LABCORP): Prostate Specific Ag, Serum: 487 ng/mL — ABNORMAL HIGH (ref 0.0–4.0)

## 2023-11-02 ENCOUNTER — Ambulatory Visit (HOSPITAL_COMMUNITY)
Admission: RE | Admit: 2023-11-02 | Discharge: 2023-11-02 | Disposition: A | Discharge status: Home / Self Care | Payer: Medicare Other | Source: Ambulatory Visit | Attending: Hematology and Oncology | Admitting: Hematology and Oncology

## 2023-11-02 DIAGNOSIS — C61 Malignant neoplasm of prostate: Secondary | ICD-10-CM | POA: Diagnosis present

## 2023-11-02 DIAGNOSIS — Z192 Hormone resistant malignancy status: Secondary | ICD-10-CM | POA: Insufficient documentation

## 2023-11-02 MED ORDER — SODIUM CHLORIDE 0.9 % IV BOLUS: 1000.0000 mL | Freq: Once | INTRAVENOUS | Status: DC

## 2023-11-02 MED ORDER — SODIUM CHLORIDE 0.9 % IV SOLN: INTRAVENOUS | Status: AC

## 2023-11-02 MED ORDER — LUTETIUM LU 177 VIPIVOTIDE TET 1000 MBQ/ML IV SOLN
208.9900 | Freq: Once | INTRAVENOUS | Status: AC
  Administered 2023-11-02: 208.99 via INTRAVENOUS

## 2023-11-02 NOTE — Written Directive (Deleted)
  PLUVICTO  THERAPY   RADIOPHARMACEUTICAL: Lutetium 177 vipivotide tetraxetan (Pluvicto)     PRESCRIBED DOSE FOR ADMINISTRATION:  200 mCi   ROUTE OFADMINISTRATION:  IV   DIAGNOSIS:  Prostate cancer   REFERRING PHYSICIAN: Jeanie Sewer   TREATMENT #: 5   ADDITIONAL PHYSICIAN COMMENTS/NOTES:   AUTHORIZED USER SIGNATURE & TIME STAMP:

## 2023-11-02 NOTE — Progress Notes (Signed)
[  89 year old male with multifocal castrate resistant skeletal prostate cancer metastasis.]  EXAM: NUCLEAR MEDICINE PLUVICTO INJECTION  TECHNIQUE: Infusion: The nuclear medicine technologist and I personally verified the dose activity to be delivered as specified in the written directive, and verified the patient identification via 2 separate methods.  Initial flush of the intravenous catheter was performed was sterile saline. The dose syringe was connected to the catheter and the Lu-177 Pluvicto administered over a 1 to 10 min infusion. Single 10 cc  lushes with normal saline follow the dose. No complications were noted. The entire IV tubing, venocatheter, stopcock and syringes was removed in total, placed in a disposal bag and sent for assay of the residual activity, which will be reported at a later time in our EMR by the physics staff. Pressure was applied to the venipuncture site, and a compression bandage placed. Patient monitored for 1 hour following infusion.    Radiation Safety personnel were present to perform the discharge survey, as detailed on their documentation. After a short period of observation, the patient had his IV removed.  RADIOPHARMACEUTICALS: [Two hundred nine] microcuries Lu-177 PLUVICTO  FINDINGS: Current Infusion: [5]  Planned Infusions: 6    Patient presented to nuclear medicine for treatment. The patient's most recent blood counts were reviewed and remains a good candidate to proceed with Lu-177 Pluvicto.     Patient has mild chronic anemia.  No Evidence of further marrow suppression.     No renal toxicity.     PSA continues to fall.  PSA equal 487 decreased from 825 on 09/14/2023.     Patient reports mild fatigue.     The patient was situated in an infusion suite with a contact barrier placed under the arm. Intravenous access was established, using sterile technique, and a normal saline infusion from a syringe was started.      Micro-dosimetry: The prescribed radiation activity was assayed and confirmed to be within specified tolerance.  IMPRESSION: Current Infusion: [5]  Planned Infusions: 6    [The patient tolerated the infusion well. The patient will return in 6 weeks for ongoing care.]

## 2023-11-02 NOTE — Progress Notes (Signed)
Tolerated Pluvicto treatment well

## 2023-11-02 NOTE — Written Directive (Addendum)
  PLUVICTO  THERAPY   RADIOPHARMACEUTICAL: Lutetium 177 vipivotide tetraxetan (Pluvicto)     PRESCRIBED DOSE FOR ADMINISTRATION:  200 mCi   ROUTE OFADMINISTRATION:  IV   DIAGNOSIS:  Metastatic castration-resistant adenocarcinoma of prostate   REFERRING PHYSICIAN: Dr. Leonides Schanz   TREATMENT #:5   ADDITIONAL PHYSICIAN COMMENTS/NOTES:   AUTHORIZED USER SIGNATURE & TIME STAMP: Patriciaann Clan, MD   11/02/23    9:08 AM

## 2023-12-08 ENCOUNTER — Inpatient Hospital Stay: Payer: Medicare Other | Admitting: Hematology and Oncology

## 2023-12-08 ENCOUNTER — Inpatient Hospital Stay: Payer: Medicare Other | Attending: Physician Assistant

## 2023-12-08 VITALS — BP 112/64 | HR 52 | Temp 98.0°F | Resp 19 | Wt 120.0 lb

## 2023-12-08 DIAGNOSIS — C778 Secondary and unspecified malignant neoplasm of lymph nodes of multiple regions: Secondary | ICD-10-CM | POA: Insufficient documentation

## 2023-12-08 DIAGNOSIS — Z192 Hormone resistant malignancy status: Secondary | ICD-10-CM

## 2023-12-08 DIAGNOSIS — C61 Malignant neoplasm of prostate: Secondary | ICD-10-CM

## 2023-12-08 DIAGNOSIS — I10 Essential (primary) hypertension: Secondary | ICD-10-CM

## 2023-12-08 DIAGNOSIS — C7951 Secondary malignant neoplasm of bone: Secondary | ICD-10-CM | POA: Insufficient documentation

## 2023-12-08 LAB — CBC WITH DIFFERENTIAL (CANCER CENTER ONLY)
Abs Immature Granulocytes: 0.01 10*3/uL (ref 0.00–0.07)
Basophils Absolute: 0 10*3/uL (ref 0.0–0.1)
Basophils Relative: 1 %
Eosinophils Absolute: 0.1 10*3/uL (ref 0.0–0.5)
Eosinophils Relative: 2 %
HCT: 31.4 % — ABNORMAL LOW (ref 39.0–52.0)
Hemoglobin: 10.4 g/dL — ABNORMAL LOW (ref 13.0–17.0)
Immature Granulocytes: 0 %
Lymphocytes Relative: 18 %
Lymphs Abs: 0.4 10*3/uL — ABNORMAL LOW (ref 0.7–4.0)
MCH: 30.4 pg (ref 26.0–34.0)
MCHC: 33.1 g/dL (ref 30.0–36.0)
MCV: 91.8 fL (ref 80.0–100.0)
Monocytes Absolute: 0.3 10*3/uL (ref 0.1–1.0)
Monocytes Relative: 11 %
Neutro Abs: 1.7 10*3/uL (ref 1.7–7.7)
Neutrophils Relative %: 68 %
Platelet Count: 107 10*3/uL — ABNORMAL LOW (ref 150–400)
RBC: 3.42 MIL/uL — ABNORMAL LOW (ref 4.22–5.81)
RDW: 13.9 % (ref 11.5–15.5)
WBC Count: 2.5 10*3/uL — ABNORMAL LOW (ref 4.0–10.5)
nRBC: 0 % (ref 0.0–0.2)

## 2023-12-08 LAB — CMP (CANCER CENTER ONLY)
ALT: 8 U/L (ref 0–44)
AST: 20 U/L (ref 15–41)
Albumin: 3.8 g/dL (ref 3.5–5.0)
Alkaline Phosphatase: 61 U/L (ref 38–126)
Anion gap: 4 — ABNORMAL LOW (ref 5–15)
BUN: 24 mg/dL — ABNORMAL HIGH (ref 8–23)
CO2: 30 mmol/L (ref 22–32)
Calcium: 9.1 mg/dL (ref 8.9–10.3)
Chloride: 105 mmol/L (ref 98–111)
Creatinine: 1.13 mg/dL (ref 0.61–1.24)
GFR, Estimated: >60 mL/min (ref >60)
Glucose, Bld: 96 mg/dL (ref 70–99)
Potassium: 3.9 mmol/L (ref 3.5–5.1)
Sodium: 139 mmol/L (ref 135–145)
Total Bilirubin: 0.5 mg/dL (ref 0.0–1.2)
Total Protein: 7 g/dL (ref 6.5–8.1)

## 2023-12-08 NOTE — Progress Notes (Signed)
 [facility] [phone]   [phone]  PROGRESS NOTE  Patient Care Team: Lorenda Ishihara, MD as PCP - General (Internal Medicine) Cherlyn Cushing, RN as Oncology Nurse Navigator  Hematological/Oncological History # Castration-Resistant Advanced Prostate Cancer  11/2021: started eligard and Xtandi 160 mg PO daily.  08/12/2022: last visit with Dr. Clelia Croft 11/11/2022: transition care to Dr. Leonides Schanz  01/20/2022: approximate start date of Zytiga 1000 mg PO daily with prednisone 5 mg 03/01/2023: PET PSMA showed progression of disease of metastatic adenopathy in the LEFT supraclavicular nodal station, LEFT paratracheal nodal station,pelvic adenopathy and retroperitoneal nodal stations.Interval progression of prostate cancer skeletal metastasis with several new lesions.  05/11/2023: Pluvicto treatment (1 out of planned 6) 06/17/2023: Pluvicto treatment (2 out of planned 6) 07/29/2023: Pluvicto treatment (3 out of planned 6) 09/14/2023: Pluvicto treatment (4 out of planned 6) 11/02/2023: Pluvicto treatment (5 out of planned 6) 12/16/2023: anticipated Pluvicto treatment (6 out of planned 6)  Interval History:  Todd Holmes 89 y.o. male with medical history significant for Castration-Resistant Advanced Prostate Cancer  presents for a follow up visit. In the interim, he has completed his 5th pluvicto treatment on 11/01/2022. He is accompanied by his cousin for this visit.    Mr. Todd Holmes reports he has been well overall in the room since her last visit.  He notes he tolerated the first 5 Pluvicto treatments well and is looking forward to the next 1 next week.  Reports he thinks he should be drinking more water.  He notes his weight is dropping and he feels like he eats okay.  He does only eat 2 meals per day, late breakfast and dinner.  He reports he is not having any nausea, vomiting, or diarrhea.  He denies any lightheadedness, dizziness, shortness of breath.  He is  not dizzy but he does feel somewhat unstable on his feet and tends to walk with a cane.  He has particular trouble with turning.  He notes that he is not currently in any pain is having no trouble with headaches.  He is had no recent infectious symptoms such as runny nose, sore throat, or cough.  He denies any fevers, chills, sweats.  Full 10 point ROS is otherwise negative.   MEDICAL HISTORY:  Past Medical History:  Diagnosis Date   Arthritis    knees   Cancer (HCC)    prostate, tx 2005- radiation    Keloid    keloid across chest, pt. unsure of how he encountered it   Memory disorder 11/28/2014   Neuromuscular disorder (HCC)    benign- tremor- occas., treated /w nadolol   Tremor 11/28/2014    SURGICAL HISTORY: Past Surgical History:  Procedure Laterality Date   CATARACT EXTRACTION W/PHACO  09/23/2011   Procedure: CATARACT EXTRACTION PHACO AND INTRAOCULAR LENS PLACEMENT (IOC);  Surgeon: Chalmers Guest, MD;  Location: [facility] OR;  Service: Ophthalmology;  Laterality: Right;   EYE SURGERY Bilateral    L cataract removed & IOL   HERNIA REPAIR     bilateral hernia repair, inguinal, 1980's      SOCIAL HISTORY: Social History   Socioeconomic History   Marital status: Single    Spouse name: Not on file   Number of children: 0   Years of education: 14   Highest education level: Not on file  Occupational History   Occupation: retired  Tobacco Use   Smoking status: Never   Smokeless tobacco: Never  Substance and Sexual Activity   Alcohol use:  No   Drug use: No   Sexual activity: Not on file  Other Topics Concern   Not on file  Social History Narrative   Patient is right handed.   Patient does not drink caffeine.   Social Drivers of Corporate investment banker Strain: Not on file  Food Insecurity: Not on file  Transportation Needs: Not on file  Physical Activity: Not on file  Stress: Not on file  Social Connections: Not on file  Intimate Partner Violence: Not on file     FAMILY HISTORY: Family History  Problem Relation Age of Onset   Anesthesia problems Neg Hx    Hypotension Neg Hx    Malignant hyperthermia Neg Hx    Pseudochol deficiency Neg Hx    Cancer Mother    Stroke Father    Lymphoma Brother     ALLERGIES:  has no known allergies.  MEDICATIONS:  Current Outpatient Medications  Medication Sig Dispense Refill   amLODipine (NORVASC) 5 MG tablet Take 5 mg by mouth daily.     escitalopram (LEXAPRO) 10 MG tablet Take 10 mg by mouth daily.     metoprolol succinate (TOPROL-XL) 100 MG 24 hr tablet Take 100 mg by mouth daily.     Multiple Vitamins-Minerals (MULTIVITAMINS THER. W/MINERALS) TABS Take 1 tablet by mouth daily.       MYRBETRIQ 25 MG TB24 tablet Take 25 mg by mouth daily.     ondansetron (ZOFRAN) 4 MG tablet Take 4 mg by mouth as needed for nausea or vomiting. (Patient not taking: Reported on 10/27/2023)     ZYLET 0.5-0.3 % SUSP Apply to eye.     No current facility-administered medications for this visit.    REVIEW OF SYSTEMS:   Constitutional: ( - ) fevers, ( - )  chills , ( - ) night sweats Eyes: ( - ) blurriness of vision, ( - ) double vision, ( - ) watery eyes Ears, nose, mouth, throat, and face: ( - ) mucositis, ( - ) sore throat Respiratory: ( - ) cough, ( - ) dyspnea, ( - ) wheezes Cardiovascular: ( - ) palpitation, ( - ) chest discomfort, ( - ) lower extremity swelling Gastrointestinal:  ( - ) nausea, ( - ) heartburn, ( - ) change in bowel habits Skin: ( - ) abnormal skin rashes Lymphatics: ( - ) new lymphadenopathy, ( - ) easy bruising Neurological: ( - ) numbness, ( - ) tingling, ( - ) new weaknesses Behavioral/Psych: ( - ) mood change, ( - ) new changes  All other systems were reviewed with the patient and are negative.  PHYSICAL EXAMINATION: Vitals:   12/08/23 1511  BP: 112/64  Pulse: (!) 52  Resp: 19  Temp: 98 F (36.7 C)  SpO2: 99%     Constitutional: Oriented to person, place, and time and  well-developed, well-nourished, and in no distress.  HENT:  Head: Normocephalic and atraumatic.  Eyes: Conjunctivae are normal. Right eye exhibits no discharge. Left eye exhibits no discharge. No scleral icterus.  Cardiovascular: Normal rate, regular rhythm, normal heart sounds Pulmonary/Chest: Effort normal and breath sounds normal. No respiratory distress. No wheezes. No rales.  Musculoskeletal: Normal range of motion. Exhibits no edema.  Neurological: Alert and oriented to person, place, and time. Exhibits normal muscle tone. Gait normal. Coordination normal.  Skin: Skin is warm and dry. No rash noted. Not diaphoretic. No erythema. No pallor.  Psychiatric: Mood, memory and judgment normal.    LABORATORY DATA:  I  have reviewed the data as listed    Latest Ref Rng & Units 12/08/2023    2:52 PM 10/27/2023    1:03 PM 09/14/2023   11:32 AM  CBC  WBC 4.0 - 10.5 K/uL 2.5  3.0  4.1   Hemoglobin 13.0 - 17.0 g/dL 29.5  62.1  30.8   Hematocrit 39.0 - 52.0 % 31.4  32.7  34.4   Platelets 150 - 400 K/uL 107  126  149        Latest Ref Rng & Units 12/08/2023    2:52 PM 10/27/2023    1:03 PM 09/14/2023   11:32 AM  CMP  Glucose 70 - 99 mg/dL 96  94  98   BUN 8 - 23 mg/dL 24  29  29    Creatinine 0.61 - 1.24 mg/dL 6.57  8.46  9.62   Sodium 135 - 145 mmol/L 139  137  136   Potassium 3.5 - 5.1 mmol/L 3.9  4.2  3.6   Chloride 98 - 111 mmol/L 105  104  104   CO2 22 - 32 mmol/L 30  30  25    Calcium 8.9 - 10.3 mg/dL 9.1  9.1  9.2   Total Protein 6.5 - 8.1 g/dL 7.0  7.1  7.5   Total Bilirubin 0.0 - 1.2 mg/dL 0.5  0.5  0.7   Alkaline Phos 38 - 126 U/L 61  61  65   AST 15 - 41 U/L 20  19  23    ALT 0 - 44 U/L 8  7  11      RADIOGRAPHIC STUDIES: No results found.  ASSESSMENT & PLAN ADRICK KESTLER is a 89 y.o. male with medical history significant for Castration-Resistant Advanced Prostate Cancer  presents for a follow up visit.   # Castration-Resistant Advanced Prostate Cancer  --Continue  Eligard therapy with urology. --Currently receiving Pluvicto therapy. Next pluvicto treatment scheduled on 12/16/2023  --Labs from today showed WBC 2.5, Hgb 10.4, MCV 91.8, Plt 107, Creatinine and LFTs normal. PSA level pending. Last PSA from 10/27/2023 was 487 (previously 1507) -- Return to clinic in 6 weeks post pluvictor to reassess.  If stable can extend visits to q 3 months after that point.   No orders of the defined types were placed in this encounter.  All questions were answered. The patient knows to call the clinic with any problems, questions or concerns.   I have spent a total of 30 minutes minutes of face-to-face and non-face-to-face time, preparing to see the patient, performing a medically appropriate examination, counseling and educating the patient,  documenting clinical information in the electronic health record, independently interpreting results and communicating results to the patient, and care coordination.    Ulysees Barns, MD Department of Hematology/Oncology [facility] at [facility] Phone: [phone] Pager: [phone] Email: [email or web address]    12/08/2023 4:01 PM

## 2023-12-09 ENCOUNTER — Encounter: Payer: Self-pay | Admitting: Physician Assistant

## 2023-12-09 ENCOUNTER — Other Ambulatory Visit (HOSPITAL_COMMUNITY): Payer: Medicare Other

## 2023-12-09 LAB — PROSTATE-SPECIFIC AG, SERUM (LABCORP): Prostate Specific Ag, Serum: 308 ng/mL — ABNORMAL HIGH (ref 0.0–4.0)

## 2023-12-15 NOTE — Written Directive (Cosign Needed)
  PLUVICTO  THERAPY   RADIOPHARMACEUTICAL: Lutetium 177 vipivotide tetraxetan (Pluvicto)     PRESCRIBED DOSE FOR ADMINISTRATION:  200 mCi   ROUTE OFADMINISTRATION:  IV   DIAGNOSIS:  prostate cancer, metastatic castration-resistant adenocarcinoma of prostate, malignant neoplasm of prostate   REFERRING PHYSICIAN: Jeanie Sewer   TREATMENT #: 6   ADDITIONAL PHYSICIAN COMMENTS/NOTES:   AUTHORIZED USER SIGNATURE & TIME STAMP: Patriciaann Clan, MD   12/16/23    10:11 AM

## 2023-12-16 ENCOUNTER — Ambulatory Visit (HOSPITAL_COMMUNITY)
Admission: RE | Admit: 2023-12-16 | Discharge: 2023-12-16 | Disposition: A | Discharge status: Home / Self Care | Payer: Medicare Other | Source: Ambulatory Visit | Attending: Hematology and Oncology | Admitting: Hematology and Oncology

## 2023-12-16 DIAGNOSIS — C61 Malignant neoplasm of prostate: Secondary | ICD-10-CM | POA: Insufficient documentation

## 2023-12-16 DIAGNOSIS — Z192 Hormone resistant malignancy status: Secondary | ICD-10-CM | POA: Insufficient documentation

## 2023-12-16 MED ORDER — SODIUM CHLORIDE 0.9 % IV SOLN: INTRAVENOUS | Status: AC

## 2023-12-16 MED ORDER — LUTETIUM LU 177 VIPIVOTIDE TET 1000 MBQ/ML IV SOLN
195.9900 | Freq: Once | INTRAVENOUS | Status: AC
  Administered 2023-12-16: 195.99 via INTRAVENOUS

## 2023-12-16 MED ORDER — SODIUM CHLORIDE 0.9 % IV BOLUS: 1000.0000 mL | Freq: Once | INTRAVENOUS | Status: DC

## 2023-12-16 NOTE — Progress Notes (Signed)
Tolerated treatment well.

## 2023-12-16 NOTE — Progress Notes (Signed)
 CLINICAL DATA: Metastatic prostate adenocarcinoma.  Castrate resistant.  89 year old male.     EXAM: NUCLEAR MEDICINE PLUVICTO INJECTION  TECHNIQUE: Infusion: The nuclear medicine technologist and I personally verified the dose activity to be delivered as specified in the written directive, and verified the patient identification via 2 separate methods.  Initial flush of the intravenous catheter was performed was sterile saline. The dose syringe was connected to the catheter and the Lu-177 Pluvicto administered over a 1 to 10 min infusion. Single 10 cc  lushes with normal saline follow the dose. No complications were noted. The entire IV tubing, venocatheter, stopcock and syringes was removed in total, placed in a disposal bag and sent for assay of the residual activity, which will be reported at a later time in our EMR by the physics staff. Pressure was applied to the venipuncture site, and a compression bandage placed. Patient monitored for 1 hour following infusion.    Radiation Safety personnel were present to perform the discharge survey, as detailed on their documentation. After a short period of observation, the patient had his IV removed.  RADIOPHARMACEUTICALS: [One hundred ninety-six] microcuries Lu-177 PLUVICTO  FINDINGS: Current Infusion: [6]  Planned Infusions: 6    Patient presented to nuclear medicine for treatment. The patient's most recent blood counts were reviewed and remains a good candidate to proceed with Lu-177 Pluvicto.      No mild suppression, renal toxicity, hepatic toxicity.     PSA continues to decline with current value equal 308.     The patient was situated in an infusion suite with a contact barrier placed under the arm. Intravenous access was established, using sterile technique, and a normal saline infusion from a syringe was started.     Micro-dosimetry: The prescribed radiation activity was assayed and confirmed to be within specified  tolerance.  IMPRESSION: Current Infusion: [6]  Planned Infusions: 6    [The patient tolerated the infusion well. The patient will resume routine oncology surveillance.

## 2024-02-02 ENCOUNTER — Inpatient Hospital Stay: Admitting: Hematology and Oncology

## 2024-02-02 ENCOUNTER — Inpatient Hospital Stay: Attending: Physician Assistant

## 2024-02-02 VITALS — BP 114/63 | HR 53 | Temp 98.0°F | Resp 14 | Wt 119.0 lb

## 2024-02-02 DIAGNOSIS — Z192 Hormone resistant malignancy status: Secondary | ICD-10-CM

## 2024-02-02 DIAGNOSIS — C7951 Secondary malignant neoplasm of bone: Secondary | ICD-10-CM | POA: Diagnosis not present

## 2024-02-02 DIAGNOSIS — Z79899 Other long term (current) drug therapy: Secondary | ICD-10-CM | POA: Diagnosis not present

## 2024-02-02 DIAGNOSIS — Z809 Family history of malignant neoplasm, unspecified: Secondary | ICD-10-CM | POA: Diagnosis not present

## 2024-02-02 DIAGNOSIS — C61 Malignant neoplasm of prostate: Secondary | ICD-10-CM | POA: Diagnosis present

## 2024-02-02 DIAGNOSIS — I10 Essential (primary) hypertension: Secondary | ICD-10-CM | POA: Diagnosis not present

## 2024-02-02 LAB — CMP (CANCER CENTER ONLY)
ALT: 7 U/L (ref 0–44)
AST: 21 U/L (ref 15–41)
Albumin: 3.9 g/dL (ref 3.5–5.0)
Alkaline Phosphatase: 63 U/L (ref 38–126)
Anion gap: 6 (ref 5–15)
BUN: 22 mg/dL (ref 8–23)
CO2: 30 mmol/L (ref 22–32)
Calcium: 9.1 mg/dL (ref 8.9–10.3)
Chloride: 105 mmol/L (ref 98–111)
Creatinine: 1.12 mg/dL (ref 0.61–1.24)
GFR, Estimated: >60 mL/min (ref >60)
Glucose, Bld: 95 mg/dL (ref 70–99)
Potassium: 4.2 mmol/L (ref 3.5–5.1)
Sodium: 141 mmol/L (ref 135–145)
Total Bilirubin: 0.6 mg/dL (ref 0.0–1.2)
Total Protein: 7.1 g/dL (ref 6.5–8.1)

## 2024-02-02 LAB — CBC WITH DIFFERENTIAL (CANCER CENTER ONLY)
Abs Immature Granulocytes: 0.01 10*3/uL (ref 0.00–0.07)
Basophils Absolute: 0 10*3/uL (ref 0.0–0.1)
Basophils Relative: 0 %
Eosinophils Absolute: 0.1 10*3/uL (ref 0.0–0.5)
Eosinophils Relative: 3 %
HCT: 29.7 % — ABNORMAL LOW (ref 39.0–52.0)
Hemoglobin: 9.9 g/dL — ABNORMAL LOW (ref 13.0–17.0)
Immature Granulocytes: 0 %
Lymphocytes Relative: 18 %
Lymphs Abs: 0.5 10*3/uL — ABNORMAL LOW (ref 0.7–4.0)
MCH: 30.5 pg (ref 26.0–34.0)
MCHC: 33.3 g/dL (ref 30.0–36.0)
MCV: 91.4 fL (ref 80.0–100.0)
Monocytes Absolute: 0.4 10*3/uL (ref 0.1–1.0)
Monocytes Relative: 15 %
Neutro Abs: 1.7 10*3/uL (ref 1.7–7.7)
Neutrophils Relative %: 64 %
Platelet Count: 94 10*3/uL — ABNORMAL LOW (ref 150–400)
RBC: 3.25 MIL/uL — ABNORMAL LOW (ref 4.22–5.81)
RDW: 14 % (ref 11.5–15.5)
WBC Count: 2.6 10*3/uL — ABNORMAL LOW (ref 4.0–10.5)
nRBC: 0 % (ref 0.0–0.2)

## 2024-02-02 NOTE — Progress Notes (Signed)
 [facility] [phone]   [phone]  PROGRESS NOTE  Patient Care Team: Todd Lathe, MD as PCP - General (Internal Medicine) Todd Palmer, RN as Oncology Nurse Navigator  Hematological/Oncological History # Castration-Resistant Advanced Prostate Cancer  11/2021: started eligard  and Xtandi 160 mg PO daily.  08/12/2022: last visit with Dr. Dirk Holmes 11/11/2022: transition care to Dr. Rosaline Holmes  01/20/2022: approximate start date of Zytiga  1000 mg PO daily with prednisone  5 mg 03/01/2023: PET PSMA showed progression of disease of metastatic adenopathy in the LEFT supraclavicular nodal station, LEFT paratracheal nodal station,pelvic adenopathy and retroperitoneal nodal stations.Interval progression of prostate cancer skeletal metastasis with several new lesions.  05/11/2023: Pluvicto  treatment (1 out of planned 6) 06/17/2023: Pluvicto  treatment (2 out of planned 6) 07/29/2023: Pluvicto  treatment (3 out of planned 6) 09/14/2023: Pluvicto  treatment (4 out of planned 6) 11/02/2023: Pluvicto  treatment (5 out of planned 6) 12/16/2023: Pluvicto  treatment (6 out of planned 6)  Interval History:  Todd Holmes 89 y.o. male with medical history significant for Castration-Resistant Advanced Prostate Cancer  presents for a follow up visit. In the interim, he has completed his 5th pluvicto  treatment on 11/01/2022. He is accompanied by his cousin for this visit.    Mr. Todd Holmes reports he tolerated his Pluvicto  treatments well with no difficulty.  He reports no major side effects from his treatment such as fatigue.  He reports he is had no nausea, vomiting, or diarrhea.  He is not currently suffering from any pain.  He reports that he has good appetite and only eats about 2 meals per day.  Reports he eats his breakfast early and a late dinner.  He reports that his energy is holding up and he is able to his day-to-day task without difficulty.  He reports that he otherwise  has had no changes in his health.  He denies any viral illnesses with fevers, chills, sweats.  A full 10 point ROS was otherwise negative.  Today we discussed steps moving forward after completion of Pluvicto .  We recommended continuation of his Lupron  therapy with monitoring of his PSA level.  His PSA continues to drop.  We have voiced that the neck step would be chemotherapy and given his advanced age and deconditioning I would be hesitant to proceed with that treatment.  He voices understanding of our findings and plan moving forward.   MEDICAL HISTORY:  Past Medical History:  Diagnosis Date   Arthritis    knees   Cancer (HCC)    prostate, tx 2005- radiation    Keloid    keloid across chest, pt. unsure of how he encountered it   Memory disorder 11/28/2014   Neuromuscular disorder (HCC)    benign- tremor- occas., treated /w nadolol   Tremor 11/28/2014    SURGICAL HISTORY: Past Surgical History:  Procedure Laterality Date   CATARACT EXTRACTION W/PHACO  09/23/2011   Procedure: CATARACT EXTRACTION PHACO AND INTRAOCULAR LENS PLACEMENT (IOC);  Surgeon: Ben Bracken, MD;  Location: [facility] OR;  Service: Ophthalmology;  Laterality: Right;   EYE SURGERY Bilateral    L cataract removed & IOL   HERNIA REPAIR     bilateral hernia repair, inguinal, 1980's      SOCIAL HISTORY: Social History   Socioeconomic History   Marital status: Single    Spouse name: Not on file   Number of children: 0   Years of education: 14   Highest education level: Not on file  Occupational History   Occupation: retired  Tobacco Use   Smoking status: Never   Smokeless tobacco: Never  Substance and Sexual Activity   Alcohol use: No   Drug use: No   Sexual activity: Not on file  Other Topics Concern   Not on file  Social History Narrative   Patient is right handed.   Patient does not drink caffeine.   Social Drivers of Corporate investment banker Strain: Not on file  Food Insecurity: Not on file   Transportation Needs: Not on file  Physical Activity: Not on file  Stress: Not on file  Social Connections: Not on file  Intimate Partner Violence: Not on file    FAMILY HISTORY: Family History  Problem Relation Age of Onset   Anesthesia problems Neg Hx    Hypotension Neg Hx    Malignant hyperthermia Neg Hx    Pseudochol deficiency Neg Hx    Cancer Mother    Stroke Father    Lymphoma Brother     ALLERGIES:  has no known allergies.  MEDICATIONS:  Current Outpatient Medications  Medication Sig Dispense Refill   amLODipine (NORVASC) 5 MG tablet Take 5 mg by mouth daily.     escitalopram (LEXAPRO) 10 MG tablet Take 10 mg by mouth daily.     metoprolol succinate (TOPROL-XL) 100 MG 24 hr tablet Take 100 mg by mouth daily.     Multiple Vitamins-Minerals (MULTIVITAMINS THER. W/MINERALS) TABS Take 1 tablet by mouth daily.       MYRBETRIQ 25 MG TB24 tablet Take 25 mg by mouth daily.     ondansetron (ZOFRAN) 4 MG tablet Take 4 mg by mouth as needed for nausea or vomiting. (Patient not taking: Reported on 10/27/2023)     ZYLET 0.5-0.3 % SUSP Apply to eye.     No current facility-administered medications for this visit.    REVIEW OF SYSTEMS:   Constitutional: ( - ) fevers, ( - )  chills , ( - ) night sweats Eyes: ( - ) blurriness of vision, ( - ) double vision, ( - ) watery eyes Ears, nose, mouth, throat, and face: ( - ) mucositis, ( - ) sore throat Respiratory: ( - ) cough, ( - ) dyspnea, ( - ) wheezes Cardiovascular: ( - ) palpitation, ( - ) chest discomfort, ( - ) lower extremity swelling Gastrointestinal:  ( - ) nausea, ( - ) heartburn, ( - ) change in bowel habits Skin: ( - ) abnormal skin rashes Lymphatics: ( - ) new lymphadenopathy, ( - ) easy bruising Neurological: ( - ) numbness, ( - ) tingling, ( - ) new weaknesses Behavioral/Psych: ( - ) mood change, ( - ) new changes  All other systems were reviewed with the patient and are negative.  PHYSICAL EXAMINATION: Vitals:    02/02/24 1552  BP: 114/63  Pulse: (!) 53  Resp: 14  Temp: 98 F (36.7 C)  SpO2: 100%      Constitutional: Oriented to person, place, and time and well-developed, well-nourished, and in no distress.  HENT:  Head: Normocephalic and atraumatic.  Eyes: Conjunctivae are normal. Right eye exhibits no discharge. Left eye exhibits no discharge. No scleral icterus.  Cardiovascular: Normal rate, regular rhythm, normal heart sounds Pulmonary/Chest: Effort normal and breath sounds normal. No respiratory distress. No wheezes. No rales.  Musculoskeletal: Normal range of motion. Exhibits no edema.  Neurological: Alert and oriented to person, place, and time. Exhibits normal muscle tone. Gait normal. Coordination normal.  Skin: Skin is warm and dry. No  rash noted. Not diaphoretic. No erythema. No pallor.  Psychiatric: Mood, memory and judgment normal.    LABORATORY DATA:  I have reviewed the data as listed    Latest Ref Rng & Units 02/02/2024    3:03 PM 12/08/2023    2:52 PM 10/27/2023    1:03 PM  CBC  WBC 4.0 - 10.5 K/uL 2.6  2.5  3.0   Hemoglobin 13.0 - 17.0 g/dL 9.9  16.1  09.6   Hematocrit 39.0 - 52.0 % 29.7  31.4  32.7   Platelets 150 - 400 K/uL 94  107  126        Latest Ref Rng & Units 02/02/2024    3:03 PM 12/08/2023    2:52 PM 10/27/2023    1:03 PM  CMP  Glucose 70 - 99 mg/dL 95  96  94   BUN 8 - 23 mg/dL 22  24  29    Creatinine 0.61 - 1.24 mg/dL 0.45  4.09  8.11   Sodium 135 - 145 mmol/L 141  139  137   Potassium 3.5 - 5.1 mmol/L 4.2  3.9  4.2   Chloride 98 - 111 mmol/L 105  105  104   CO2 22 - 32 mmol/L 30  30  30    Calcium 8.9 - 10.3 mg/dL 9.1  9.1  9.1   Total Protein 6.5 - 8.1 g/dL 7.1  7.0  7.1   Total Bilirubin 0.0 - 1.2 mg/dL 0.6  0.5  0.5   Alkaline Phos 38 - 126 U/L 63  61  61   AST 15 - 41 U/L 21  20  19    ALT 0 - 44 U/L 7  8  7      RADIOGRAPHIC STUDIES: No results found.  ASSESSMENT & PLAN JOBANI SABADO is a 89 y.o. male with medical history significant  for Castration-Resistant Advanced Prostate Cancer  presents for a follow up visit.   # Castration-Resistant Advanced Prostate Cancer  --Continue Eligard  therapy with urology.  Continue to receive as scheduled. --Patient completed a full 6 cycles of Pluvicto  therapy --Labs from today showed WBC 2.6, hemoglobin 9.9, MCV 91.4, platelets 94, Creatinine and LFTs normal. PSA level pending. Last PSA from 12/08/2023 was 308 (previously 1507 on 05/03/2024) -- Return to clinic in 8 weeks.  If stable can extend visits to q 3 months after that point.   No orders of the defined types were placed in this encounter.  All questions were answered. The patient knows to call the clinic with any problems, questions or concerns.   I have spent a total of 30 minutes minutes of face-to-face and non-face-to-face time, preparing to see the patient, performing a medically appropriate examination, counseling and educating the patient,  documenting clinical information in the electronic health record, independently interpreting results and communicating results to the patient, and care coordination.    Rogerio Clay, MD Department of Hematology/Oncology [facility] Phone: [phone] Pager: [phone] Email: [email or web address]    02/05/2024 8:46 PM

## 2024-02-03 LAB — TESTOSTERONE: Testosterone: <3 ng/dL — ABNORMAL LOW (ref 264–916)

## 2024-02-03 LAB — PROSTATE-SPECIFIC AG, SERUM (LABCORP): Prostate Specific Ag, Serum: 238 ng/mL — ABNORMAL HIGH (ref 0.0–4.0)

## 2024-02-04 ENCOUNTER — Ambulatory Visit: Payer: Self-pay | Admitting: *Deleted

## 2024-02-04 NOTE — Telephone Encounter (Signed)
-----   Message from Rogerio Clay IV sent at 02/03/2024  8:51 AM EDT ----- Please let Mr. Bolds know that his PSA continues to drop, currently down to 238. This is a good response to his treatment. We will plan to see him back in 2 months time. ----- Message ----- From: Dannis Dy, Lab [facility] [place] Sent: 02/02/2024   3:21 PM EDT To: Ander Bame, MD

## 2024-02-04 NOTE — Telephone Encounter (Signed)
 TCT patient regarding recent lab results. Spoke with him. Advised that his PSA continues to drop, currently down to 238. This is a good response to his treatment. We will plan to see him back in 2 months time. Pt pleased with results. Reviewed his appt date and time with him for July 2025

## 2024-02-20 ENCOUNTER — Inpatient Hospital Stay (HOSPITAL_COMMUNITY)
Admission: EM | Admit: 2024-02-20 | Discharge: 2024-02-24 | DRG: 350 | Disposition: A | Discharge status: Skilled Nursing Facility | Attending: Internal Medicine | Admitting: Internal Medicine

## 2024-02-20 ENCOUNTER — Encounter (HOSPITAL_COMMUNITY): Payer: Self-pay

## 2024-02-20 ENCOUNTER — Other Ambulatory Visit: Payer: Self-pay

## 2024-02-20 ENCOUNTER — Emergency Department (HOSPITAL_COMMUNITY)

## 2024-02-20 DIAGNOSIS — M7989 Other specified soft tissue disorders: Secondary | ICD-10-CM | POA: Diagnosis present

## 2024-02-20 DIAGNOSIS — D649 Anemia, unspecified: Secondary | ICD-10-CM | POA: Diagnosis present

## 2024-02-20 DIAGNOSIS — C7951 Secondary malignant neoplasm of bone: Secondary | ICD-10-CM | POA: Diagnosis present

## 2024-02-20 DIAGNOSIS — I10 Essential (primary) hypertension: Secondary | ICD-10-CM | POA: Diagnosis present

## 2024-02-20 DIAGNOSIS — R413 Other amnesia: Secondary | ICD-10-CM | POA: Diagnosis present

## 2024-02-20 DIAGNOSIS — Z742 Need for assistance at home and no other household member able to render care: Secondary | ICD-10-CM | POA: Diagnosis present

## 2024-02-20 DIAGNOSIS — D696 Thrombocytopenia, unspecified: Secondary | ICD-10-CM | POA: Diagnosis present

## 2024-02-20 DIAGNOSIS — K56609 Unspecified intestinal obstruction, unspecified as to partial versus complete obstruction: Secondary | ICD-10-CM | POA: Diagnosis present

## 2024-02-20 DIAGNOSIS — Z923 Personal history of irradiation: Secondary | ICD-10-CM

## 2024-02-20 DIAGNOSIS — C61 Malignant neoplasm of prostate: Secondary | ICD-10-CM | POA: Diagnosis present

## 2024-02-20 DIAGNOSIS — I440 Atrioventricular block, first degree: Secondary | ICD-10-CM | POA: Diagnosis present

## 2024-02-20 DIAGNOSIS — I452 Bifascicular block: Secondary | ICD-10-CM | POA: Diagnosis present

## 2024-02-20 DIAGNOSIS — K4031 Unilateral inguinal hernia, with obstruction, without gangrene, recurrent: Principal | ICD-10-CM | POA: Diagnosis present

## 2024-02-20 DIAGNOSIS — Z807 Family history of other malignant neoplasms of lymphoid, hematopoietic and related tissues: Secondary | ICD-10-CM

## 2024-02-20 DIAGNOSIS — E8721 Acute metabolic acidosis: Secondary | ICD-10-CM | POA: Diagnosis present

## 2024-02-20 DIAGNOSIS — R2681 Unsteadiness on feet: Secondary | ICD-10-CM | POA: Diagnosis present

## 2024-02-20 DIAGNOSIS — E43 Unspecified severe protein-calorie malnutrition: Secondary | ICD-10-CM | POA: Diagnosis present

## 2024-02-20 DIAGNOSIS — Z79899 Other long term (current) drug therapy: Secondary | ICD-10-CM

## 2024-02-20 DIAGNOSIS — R109 Unspecified abdominal pain: Secondary | ICD-10-CM | POA: Diagnosis not present

## 2024-02-20 DIAGNOSIS — Z66 Do not resuscitate: Secondary | ICD-10-CM | POA: Diagnosis present

## 2024-02-20 DIAGNOSIS — K460 Unspecified abdominal hernia with obstruction, without gangrene: Principal | ICD-10-CM | POA: Diagnosis present

## 2024-02-20 DIAGNOSIS — Z192 Hormone resistant malignancy status: Secondary | ICD-10-CM | POA: Diagnosis present

## 2024-02-20 DIAGNOSIS — F09 Unspecified mental disorder due to known physiological condition: Secondary | ICD-10-CM | POA: Diagnosis present

## 2024-02-20 DIAGNOSIS — D630 Anemia in neoplastic disease: Secondary | ICD-10-CM | POA: Diagnosis present

## 2024-02-20 DIAGNOSIS — R591 Generalized enlarged lymph nodes: Secondary | ICD-10-CM | POA: Diagnosis present

## 2024-02-20 DIAGNOSIS — Z823 Family history of stroke: Secondary | ICD-10-CM

## 2024-02-20 DIAGNOSIS — R600 Localized edema: Secondary | ICD-10-CM | POA: Diagnosis present

## 2024-02-20 DIAGNOSIS — H5789 Other specified disorders of eye and adnexa: Secondary | ICD-10-CM | POA: Diagnosis not present

## 2024-02-20 DIAGNOSIS — K089 Disorder of teeth and supporting structures, unspecified: Secondary | ICD-10-CM | POA: Diagnosis present

## 2024-02-20 HISTORY — DX: Essential (primary) hypertension: I10

## 2024-02-20 LAB — CBC
HCT: 33.6 % — ABNORMAL LOW (ref 39.0–52.0)
Hemoglobin: 10.3 g/dL — ABNORMAL LOW (ref 13.0–17.0)
MCH: 30.2 pg (ref 26.0–34.0)
MCHC: 30.7 g/dL (ref 30.0–36.0)
MCV: 98.5 fL (ref 80.0–100.0)
Platelets: 104 10*3/uL — ABNORMAL LOW (ref 150–400)
RBC: 3.41 MIL/uL — ABNORMAL LOW (ref 4.22–5.81)
RDW: 14.3 % (ref 11.5–15.5)
WBC: 5.7 10*3/uL (ref 4.0–10.5)
nRBC: 0 % (ref 0.0–0.2)

## 2024-02-20 LAB — COMPREHENSIVE METABOLIC PANEL WITH GFR
ALT: 12 U/L (ref 0–44)
AST: 32 U/L (ref 15–41)
Albumin: 3.9 g/dL (ref 3.5–5.0)
Alkaline Phosphatase: 62 U/L (ref 38–126)
Anion gap: 14 (ref 5–15)
BUN: 23 mg/dL (ref 8–23)
CO2: 21 mmol/L — ABNORMAL LOW (ref 22–32)
Calcium: 9.3 mg/dL (ref 8.9–10.3)
Chloride: 106 mmol/L (ref 98–111)
Creatinine, Ser: 1.08 mg/dL (ref 0.61–1.24)
GFR, Estimated: >60 mL/min (ref >60)
Glucose, Bld: 171 mg/dL — ABNORMAL HIGH (ref 70–99)
Potassium: 3.8 mmol/L (ref 3.5–5.1)
Sodium: 141 mmol/L (ref 135–145)
Total Bilirubin: 0.9 mg/dL (ref 0.0–1.2)
Total Protein: 7.6 g/dL (ref 6.5–8.1)

## 2024-02-20 LAB — LIPASE, BLOOD: Lipase: 21 U/L (ref 11–51)

## 2024-02-20 MED ORDER — IOHEXOL 300 MG/ML  SOLN
100.0000 mL | Freq: Once | INTRAMUSCULAR | Status: AC | PRN
  Administered 2024-02-20: 100 mL via INTRAVENOUS

## 2024-02-20 NOTE — ED Triage Notes (Signed)
 Pt had sudden onset of nausea and dry heaving this morning but no pain at that time. He noticed some discomfort and swelling in his LLQ. Pt has large palpable area in left groin region that he points to as area of pain. Denies nausea at this time. Denies urinary symtoms and reports having several BMs today.

## 2024-02-20 NOTE — ED Provider Notes (Signed)
 [place] EMERGENCY DEPARTMENT AT [facility] Provider Note   CSN: [id number] Arrival date & time: 02/20/24  2100     History {Add pertinent medical, surgical, social history, OB history to HPI:1} Chief Complaint  Patient presents with   Abdominal Pain    Todd Holmes is a 89 y.o. male.  The history is provided by the patient and medical records.  Abdominal Pain  89 y.o. male with history of memory disorder, hypertension, neuromuscular disorder, arthritis, history of prostate cancer and has completed radiation, presenting to the ED for left lower abdominal pain.  Additional history provided by cousin at bedside.  Reportedly he has been having some left lower quadrant abdominal pain recently.  States he saw his primary care doctor and told it may be a hernia.  He does have history of bilateral hernia repairs years ago.  Today had increased pain this morning and felt like there was some swelling in the area.  He did have dry heaving but no vomiting.  Cousin at the bedside reports he has not eaten anything, only had a cup of tea today.  He did have a normal bowel movement.  Home Medications Prior to Admission medications   Medication Sig Start Date End Date Taking? Authorizing Provider  amLODipine (NORVASC) 5 MG tablet Take 5 mg by mouth daily.    [provider]  escitalopram (LEXAPRO) 10 MG tablet Take 10 mg by mouth daily. 05/07/22   [provider]  metoprolol succinate (TOPROL-XL) 100 MG 24 hr tablet Take 100 mg by mouth daily. 05/07/22   [provider]  Multiple Vitamins-Minerals (MULTIVITAMINS THER. W/MINERALS) TABS Take 1 tablet by mouth daily.      [provider]  MYRBETRIQ 25 MG TB24 tablet Take 25 mg by mouth daily. 05/07/22   [provider]  ondansetron (ZOFRAN) 4 MG tablet Take 4 mg by mouth as needed for nausea or vomiting. Patient not taking: Reported on 10/27/2023    [provider]  ZYLET 0.5-0.3 % SUSP  Apply to eye. 06/22/23   [provider]      Allergies    Patient has no known allergies.    Review of Systems   Review of Systems  Gastrointestinal:  Positive for abdominal pain.  All other systems reviewed and are negative.   Physical Exam Updated Vital Signs BP 137/86 (BP Location: Right Arm)   Pulse (!) 105   Temp 98.1 F (36.7 C) (Oral)   Resp 20   SpO2 96%   Physical Exam Vitals and nursing note reviewed.  Constitutional:      Appearance: He is well-developed.  HENT:     Head: Normocephalic and atraumatic.  Eyes:     Conjunctiva/sclera: Conjunctivae normal.     Pupils: Pupils are equal, round, and reactive to light.  Cardiovascular:     Rate and Rhythm: Normal rate and regular rhythm.     Heart sounds: Normal heart sounds.  Pulmonary:     Effort: Pulmonary effort is normal.     Breath sounds: Normal breath sounds.  Abdominal:     General: Bowel sounds are normal.     Palpations: Abdomen is soft.     Tenderness: There is abdominal tenderness.       Comments: Tender LLQ towards the inguinal area  Musculoskeletal:        General: Normal range of motion.     Cervical back: Normal range of motion.  Skin:    General: Skin is  warm and dry.  Neurological:     Mental Status: He is alert and oriented to person, place, and time.     ED Results / Procedures / Treatments   Labs (all labs ordered are listed, but only abnormal results are displayed) Labs Reviewed  CBC - Abnormal; Notable for the following components:      Result Value   RBC 3.41 (*)    Hemoglobin 10.3 (*)    HCT 33.6 (*)    Platelets 104 (*)    All other components within normal limits  LIPASE, BLOOD  COMPREHENSIVE METABOLIC PANEL WITH GFR  URINALYSIS, ROUTINE W REFLEX MICROSCOPIC    EKG None  Radiology No results found.  Procedures Procedures  {Document cardiac monitor, telemetry assessment procedure when appropriate:1}  Medications Ordered in ED Medications - No data  to display  ED Course/ Medical Decision Making/ A&P   {   Click here for ABCD2, HEART and other calculatorsREFRESH Note before signing :1}                              Medical Decision Making Amount and/or Complexity of Data Reviewed Labs: ordered. Radiology: ordered.   ***  {Document critical care time when appropriate:1} {Document review of labs and clinical decision tools ie heart score, Chads2Vasc2 etc:1}  {Document your independent review of radiology images, and any outside records:1} {Document your discussion with family members, caretakers, and with consultants:1} {Document social determinants of health affecting pt's care:1} {Document your decision making why or why not admission, treatments were needed:1} Final Clinical Impression(s) / ED Diagnoses Final diagnoses:  None    Rx / DC Orders ED Discharge Orders     None

## 2024-02-21 ENCOUNTER — Inpatient Hospital Stay (HOSPITAL_COMMUNITY): Admitting: Anesthesiology

## 2024-02-21 ENCOUNTER — Encounter (HOSPITAL_COMMUNITY)

## 2024-02-21 ENCOUNTER — Encounter (HOSPITAL_COMMUNITY)
Admission: EM | Disposition: A | Discharge status: Skilled Nursing Facility | Payer: Self-pay | Source: Home / Self Care | Attending: Family Medicine

## 2024-02-21 ENCOUNTER — Other Ambulatory Visit: Payer: Self-pay

## 2024-02-21 ENCOUNTER — Inpatient Hospital Stay (HOSPITAL_COMMUNITY)

## 2024-02-21 ENCOUNTER — Encounter (HOSPITAL_COMMUNITY): Payer: Self-pay | Admitting: Internal Medicine

## 2024-02-21 ENCOUNTER — Ambulatory Visit: Payer: Self-pay | Admitting: General Surgery

## 2024-02-21 DIAGNOSIS — R413 Other amnesia: Secondary | ICD-10-CM

## 2024-02-21 DIAGNOSIS — M1990 Unspecified osteoarthritis, unspecified site: Secondary | ICD-10-CM

## 2024-02-21 DIAGNOSIS — Z742 Need for assistance at home and no other household member able to render care: Secondary | ICD-10-CM | POA: Diagnosis present

## 2024-02-21 DIAGNOSIS — I10 Essential (primary) hypertension: Secondary | ICD-10-CM | POA: Diagnosis present

## 2024-02-21 DIAGNOSIS — K089 Disorder of teeth and supporting structures, unspecified: Secondary | ICD-10-CM | POA: Diagnosis present

## 2024-02-21 DIAGNOSIS — I452 Bifascicular block: Secondary | ICD-10-CM | POA: Diagnosis present

## 2024-02-21 DIAGNOSIS — R600 Localized edema: Secondary | ICD-10-CM | POA: Diagnosis present

## 2024-02-21 DIAGNOSIS — K460 Unspecified abdominal hernia with obstruction, without gangrene: Principal | ICD-10-CM | POA: Diagnosis present

## 2024-02-21 DIAGNOSIS — K4031 Unilateral inguinal hernia, with obstruction, without gangrene, recurrent: Secondary | ICD-10-CM

## 2024-02-21 DIAGNOSIS — D649 Anemia, unspecified: Secondary | ICD-10-CM | POA: Diagnosis present

## 2024-02-21 DIAGNOSIS — E43 Unspecified severe protein-calorie malnutrition: Secondary | ICD-10-CM | POA: Diagnosis present

## 2024-02-21 DIAGNOSIS — Z923 Personal history of irradiation: Secondary | ICD-10-CM | POA: Diagnosis not present

## 2024-02-21 DIAGNOSIS — C61 Malignant neoplasm of prostate: Secondary | ICD-10-CM | POA: Diagnosis present

## 2024-02-21 DIAGNOSIS — Z807 Family history of other malignant neoplasms of lymphoid, hematopoietic and related tissues: Secondary | ICD-10-CM | POA: Diagnosis not present

## 2024-02-21 DIAGNOSIS — Z66 Do not resuscitate: Secondary | ICD-10-CM | POA: Diagnosis present

## 2024-02-21 DIAGNOSIS — R2681 Unsteadiness on feet: Secondary | ICD-10-CM | POA: Diagnosis present

## 2024-02-21 DIAGNOSIS — D630 Anemia in neoplastic disease: Secondary | ICD-10-CM | POA: Diagnosis present

## 2024-02-21 DIAGNOSIS — R609 Edema, unspecified: Secondary | ICD-10-CM

## 2024-02-21 DIAGNOSIS — K56609 Unspecified intestinal obstruction, unspecified as to partial versus complete obstruction: Secondary | ICD-10-CM | POA: Diagnosis present

## 2024-02-21 DIAGNOSIS — M7989 Other specified soft tissue disorders: Secondary | ICD-10-CM | POA: Diagnosis present

## 2024-02-21 DIAGNOSIS — F09 Unspecified mental disorder due to known physiological condition: Secondary | ICD-10-CM | POA: Diagnosis present

## 2024-02-21 DIAGNOSIS — R591 Generalized enlarged lymph nodes: Secondary | ICD-10-CM | POA: Diagnosis present

## 2024-02-21 DIAGNOSIS — C7951 Secondary malignant neoplasm of bone: Secondary | ICD-10-CM | POA: Diagnosis present

## 2024-02-21 DIAGNOSIS — Z192 Hormone resistant malignancy status: Secondary | ICD-10-CM

## 2024-02-21 DIAGNOSIS — I080 Rheumatic disorders of both mitral and aortic valves: Secondary | ICD-10-CM | POA: Diagnosis not present

## 2024-02-21 DIAGNOSIS — Z79899 Other long term (current) drug therapy: Secondary | ICD-10-CM | POA: Diagnosis not present

## 2024-02-21 DIAGNOSIS — I440 Atrioventricular block, first degree: Secondary | ICD-10-CM | POA: Diagnosis present

## 2024-02-21 DIAGNOSIS — R109 Unspecified abdominal pain: Secondary | ICD-10-CM | POA: Diagnosis present

## 2024-02-21 DIAGNOSIS — H5789 Other specified disorders of eye and adnexa: Secondary | ICD-10-CM | POA: Diagnosis not present

## 2024-02-21 DIAGNOSIS — E8721 Acute metabolic acidosis: Secondary | ICD-10-CM | POA: Diagnosis present

## 2024-02-21 DIAGNOSIS — Z823 Family history of stroke: Secondary | ICD-10-CM | POA: Diagnosis not present

## 2024-02-21 DIAGNOSIS — D696 Thrombocytopenia, unspecified: Secondary | ICD-10-CM | POA: Diagnosis present

## 2024-02-21 HISTORY — PX: INGUINAL HERNIA REPAIR: SHX194

## 2024-02-21 LAB — TYPE AND SCREEN
ABO/RH(D): O POS
Antibody Screen: NEGATIVE

## 2024-02-21 LAB — SURGICAL PCR SCREEN
MRSA, PCR: NEGATIVE
Staphylococcus aureus: NEGATIVE

## 2024-02-21 LAB — CBC
HCT: 32.8 % — ABNORMAL LOW (ref 39.0–52.0)
Hemoglobin: 10.4 g/dL — ABNORMAL LOW (ref 13.0–17.0)
MCH: 30.8 pg (ref 26.0–34.0)
MCHC: 31.7 g/dL (ref 30.0–36.0)
MCV: 97 fL (ref 80.0–100.0)
Platelets: 100 10*3/uL — ABNORMAL LOW (ref 150–400)
RBC: 3.38 MIL/uL — ABNORMAL LOW (ref 4.22–5.81)
RDW: 14.4 % (ref 11.5–15.5)
WBC: 5.4 10*3/uL (ref 4.0–10.5)
nRBC: 0 % (ref 0.0–0.2)

## 2024-02-21 LAB — COMPREHENSIVE METABOLIC PANEL WITH GFR
ALT: 11 U/L (ref 0–44)
AST: 29 U/L (ref 15–41)
Albumin: 3.6 g/dL (ref 3.5–5.0)
Alkaline Phosphatase: 58 U/L (ref 38–126)
Anion gap: 13 (ref 5–15)
BUN: 20 mg/dL (ref 8–23)
CO2: 22 mmol/L (ref 22–32)
Calcium: 9 mg/dL (ref 8.9–10.3)
Chloride: 103 mmol/L (ref 98–111)
Creatinine, Ser: 0.87 mg/dL (ref 0.61–1.24)
GFR, Estimated: >60 mL/min (ref >60)
Glucose, Bld: 134 mg/dL — ABNORMAL HIGH (ref 70–99)
Potassium: 4.1 mmol/L (ref 3.5–5.1)
Sodium: 138 mmol/L (ref 135–145)
Total Bilirubin: 1.3 mg/dL — ABNORMAL HIGH (ref 0.0–1.2)
Total Protein: 7.3 g/dL (ref 6.5–8.1)

## 2024-02-21 LAB — LACTIC ACID, PLASMA
Lactic Acid, Venous: 1.7 mmol/L (ref 0.5–1.9)
Lactic Acid, Venous: 1.3 mmol/L (ref 0.5–1.9)

## 2024-02-21 LAB — PHOSPHORUS: Phosphorus: 2.7 mg/dL (ref 2.5–4.6)

## 2024-02-21 LAB — PROTIME-INR
INR: 1.2 (ref 0.8–1.2)
Prothrombin Time: 15.1 s (ref 11.4–15.2)

## 2024-02-21 LAB — MAGNESIUM: Magnesium: 2 mg/dL (ref 1.7–2.4)

## 2024-02-21 SURGERY — REPAIR, HERNIA, INGUINAL, BILATERAL, LAPAROSCOPIC
Anesthesia: General | Laterality: Left

## 2024-02-21 MED ORDER — ROCURONIUM BROMIDE 100 MG/10ML IV SOLN
INTRAVENOUS | Status: DC | PRN
  Administered 2024-02-21: 10 mg via INTRAVENOUS
  Administered 2024-02-21: 40 mg via INTRAVENOUS

## 2024-02-21 MED ORDER — SUGAMMADEX SODIUM 200 MG/2ML IV SOLN
INTRAVENOUS | Status: DC | PRN
  Administered 2024-02-21: 200 mg via INTRAVENOUS

## 2024-02-21 MED ORDER — DEXAMETHASONE SODIUM PHOSPHATE 10 MG/ML IJ SOLN
INTRAMUSCULAR | Status: DC | PRN
Start: 2024-02-21 — End: 2024-02-21
  Administered 2024-02-21: 8 mg via INTRAVENOUS

## 2024-02-21 MED ORDER — ACETAMINOPHEN 650 MG RE SUPP: 650.0000 mg | Freq: Four times a day (QID) | RECTAL | Status: DC | PRN

## 2024-02-21 MED ORDER — LIDOCAINE HCL (PF) 2 % IJ SOLN
INTRAMUSCULAR | Status: AC
  Filled 2024-02-21: qty 5

## 2024-02-21 MED ORDER — PHENYLEPHRINE 80 MCG/ML (10ML) SYRINGE FOR IV PUSH (FOR BLOOD PRESSURE SUPPORT)
PREFILLED_SYRINGE | INTRAVENOUS | Status: AC
  Filled 2024-02-21: qty 10

## 2024-02-21 MED ORDER — PROPOFOL 10 MG/ML IV BOLUS
INTRAVENOUS | Status: DC | PRN
Start: 2024-02-21 — End: 2024-02-21
  Administered 2024-02-21: 70 mg via INTRAVENOUS

## 2024-02-21 MED ORDER — OXYCODONE HCL 5 MG/5ML PO SOLN: 5.0000 mg | Freq: Once | ORAL | Status: DC | PRN

## 2024-02-21 MED ORDER — KETAMINE HCL 50 MG/5ML IJ SOSY
PREFILLED_SYRINGE | INTRAMUSCULAR | Status: DC | PRN
Start: 2024-02-21 — End: 2024-02-21
  Administered 2024-02-21: 20 mg via INTRAVENOUS
  Administered 2024-02-21: 10 mg via INTRAVENOUS

## 2024-02-21 MED ORDER — ACETAMINOPHEN 325 MG PO TABS: 650.0000 mg | ORAL_TABLET | Freq: Four times a day (QID) | ORAL | Status: DC | PRN

## 2024-02-21 MED ORDER — FENTANYL CITRATE PF 50 MCG/ML IJ SOSY: 12.5000 ug | PREFILLED_SYRINGE | INTRAMUSCULAR | Status: DC | PRN

## 2024-02-21 MED ORDER — SODIUM CHLORIDE 0.9 % IV SOLN: INTRAVENOUS | Status: DC

## 2024-02-21 MED ORDER — PIPERACILLIN-TAZOBACTAM 3.375 G IVPB
3.3750 g | Freq: Three times a day (TID) | INTRAVENOUS | Status: DC
  Administered 2024-02-21: 3.375 g via INTRAVENOUS
  Filled 2024-02-21: qty 50

## 2024-02-21 MED ORDER — FENTANYL CITRATE (PF) 100 MCG/2ML IJ SOLN
INTRAMUSCULAR | Status: DC | PRN
  Administered 2024-02-21 (×2): 50 ug via INTRAVENOUS

## 2024-02-21 MED ORDER — ROCURONIUM BROMIDE 10 MG/ML (PF) SYRINGE
PREFILLED_SYRINGE | INTRAVENOUS | Status: AC
  Filled 2024-02-21: qty 10

## 2024-02-21 MED ORDER — ESCITALOPRAM OXALATE 20 MG PO TABS
10.0000 mg | ORAL_TABLET | Freq: Every day | ORAL | Status: DC
  Administered 2024-02-21 – 2024-02-24 (×4): 10 mg via ORAL
  Filled 2024-02-21 (×4): qty 1

## 2024-02-21 MED ORDER — OXYCODONE HCL 5 MG PO TABS: 5.0000 mg | ORAL_TABLET | Freq: Four times a day (QID) | ORAL | Status: DC | PRN

## 2024-02-21 MED ORDER — ONDANSETRON HCL 4 MG/2ML IJ SOLN: 4.0000 mg | Freq: Once | INTRAMUSCULAR | Status: DC | PRN

## 2024-02-21 MED ORDER — DEXMEDETOMIDINE HCL IN NACL 80 MCG/20ML IV SOLN
INTRAVENOUS | Status: DC | PRN
  Administered 2024-02-21 (×2): 4 ug via INTRAVENOUS

## 2024-02-21 MED ORDER — ONDANSETRON HCL 4 MG/2ML IJ SOLN
INTRAMUSCULAR | Status: AC
  Filled 2024-02-21: qty 2

## 2024-02-21 MED ORDER — DEXMEDETOMIDINE HCL IN NACL 80 MCG/20ML IV SOLN
INTRAVENOUS | Status: AC
  Filled 2024-02-21: qty 20

## 2024-02-21 MED ORDER — OXYCODONE HCL 5 MG PO TABS: 5.0000 mg | ORAL_TABLET | Freq: Once | ORAL | Status: DC | PRN

## 2024-02-21 MED ORDER — BUPIVACAINE-EPINEPHRINE (PF) 0.25% -1:200000 IJ SOLN
INTRAMUSCULAR | Status: DC | PRN
  Administered 2024-02-21: 30 mL

## 2024-02-21 MED ORDER — KETAMINE HCL 50 MG/5ML IJ SOSY
PREFILLED_SYRINGE | INTRAMUSCULAR | Status: AC
  Filled 2024-02-21: qty 5

## 2024-02-21 MED ORDER — BUPIVACAINE-EPINEPHRINE (PF) 0.25% -1:200000 IJ SOLN
INTRAMUSCULAR | Status: AC
  Filled 2024-02-21: qty 30

## 2024-02-21 MED ORDER — 0.9 % SODIUM CHLORIDE (POUR BTL) OPTIME
TOPICAL | Status: DC | PRN
  Administered 2024-02-21: 1000 mL

## 2024-02-21 MED ORDER — SODIUM CHLORIDE 0.9 % IV SOLN: INTRAVENOUS | Status: AC

## 2024-02-21 MED ORDER — TRAMADOL HCL 50 MG PO TABS
50.0000 mg | ORAL_TABLET | Freq: Four times a day (QID) | ORAL | Status: DC | PRN
  Administered 2024-02-23: 50 mg via ORAL
  Filled 2024-02-21: qty 1

## 2024-02-21 MED ORDER — PROPOFOL 10 MG/ML IV BOLUS
INTRAVENOUS | Status: AC
  Filled 2024-02-21: qty 20

## 2024-02-21 MED ORDER — FENTANYL CITRATE PF 50 MCG/ML IJ SOSY: 25.0000 ug | PREFILLED_SYRINGE | INTRAMUSCULAR | Status: DC | PRN

## 2024-02-21 MED ORDER — PIPERACILLIN-TAZOBACTAM 3.375 G IVPB 30 MIN
3.3750 g | Freq: Once | INTRAVENOUS | Status: AC
  Administered 2024-02-21: 3.375 g via INTRAVENOUS
  Filled 2024-02-21: qty 50

## 2024-02-21 MED ORDER — MEPERIDINE HCL 50 MG/ML IJ SOLN: 6.2500 mg | INTRAMUSCULAR | Status: DC | PRN

## 2024-02-21 MED ORDER — POLYVINYL ALCOHOL 1.4 % OP SOLN
1.0000 [drp] | Freq: Two times a day (BID) | OPHTHALMIC | Status: DC
  Administered 2024-02-21 – 2024-02-24 (×7): 1 [drp] via OPHTHALMIC
  Filled 2024-02-21: qty 15

## 2024-02-21 MED ORDER — LACTATED RINGERS IV SOLN: INTRAVENOUS | Status: DC | PRN

## 2024-02-21 MED ORDER — BUPIVACAINE LIPOSOME 1.3 % IJ SUSP
INTRAMUSCULAR | Status: AC
  Filled 2024-02-21: qty 20

## 2024-02-21 MED ORDER — FENTANYL CITRATE (PF) 100 MCG/2ML IJ SOLN
INTRAMUSCULAR | Status: AC
  Filled 2024-02-21: qty 2

## 2024-02-21 MED ORDER — DEXAMETHASONE SODIUM PHOSPHATE 10 MG/ML IJ SOLN
INTRAMUSCULAR | Status: AC
  Filled 2024-02-21: qty 1

## 2024-02-21 MED ORDER — CHLORHEXIDINE GLUCONATE 0.12 % MT SOLN
15.0000 mL | Freq: Once | OROMUCOSAL | Status: AC
  Administered 2024-02-21: 15 mL via OROMUCOSAL

## 2024-02-21 MED ORDER — ONDANSETRON HCL 4 MG PO TABS: 4.0000 mg | ORAL_TABLET | Freq: Four times a day (QID) | ORAL | Status: DC | PRN

## 2024-02-21 MED ORDER — LACTATED RINGERS IV SOLN: INTRAVENOUS | Status: DC

## 2024-02-21 MED ORDER — ONDANSETRON HCL 4 MG/2ML IJ SOLN
INTRAMUSCULAR | Status: DC | PRN
Start: 2024-02-21 — End: 2024-02-21
  Administered 2024-02-21: 4 mg via INTRAVENOUS

## 2024-02-21 MED ORDER — MIRABEGRON ER 25 MG PO TB24
25.0000 mg | ORAL_TABLET | Freq: Every day | ORAL | Status: DC
  Administered 2024-02-21 – 2024-02-24 (×4): 25 mg via ORAL
  Filled 2024-02-21 (×4): qty 1

## 2024-02-21 MED ORDER — PROPOFOL 1000 MG/100ML IV EMUL
INTRAVENOUS | Status: AC
  Filled 2024-02-21: qty 100

## 2024-02-21 MED ORDER — AMLODIPINE BESYLATE 5 MG PO TABS
5.0000 mg | ORAL_TABLET | Freq: Every day | ORAL | Status: DC
  Administered 2024-02-21 – 2024-02-23 (×3): 5 mg via ORAL
  Filled 2024-02-21 (×4): qty 1

## 2024-02-21 MED ORDER — PHENYLEPHRINE HCL (PRESSORS) 10 MG/ML IV SOLN
INTRAVENOUS | Status: AC
  Filled 2024-02-21: qty 1

## 2024-02-21 MED ORDER — ONDANSETRON HCL 4 MG/2ML IJ SOLN
4.0000 mg | Freq: Four times a day (QID) | INTRAMUSCULAR | Status: DC | PRN
  Administered 2024-02-23: 4 mg via INTRAVENOUS
  Filled 2024-02-21: qty 2

## 2024-02-21 MED ORDER — LIDOCAINE HCL (CARDIAC) PF 100 MG/5ML IV SOSY
PREFILLED_SYRINGE | INTRAVENOUS | Status: DC | PRN
  Administered 2024-02-21: 100 mg via INTRAVENOUS

## 2024-02-21 MED ORDER — LABETALOL HCL 5 MG/ML IV SOLN: 10.0000 mg | INTRAVENOUS | Status: DC | PRN

## 2024-02-21 MED ORDER — METOPROLOL SUCCINATE ER 50 MG PO TB24
100.0000 mg | ORAL_TABLET | Freq: Every day | ORAL | Status: DC
  Administered 2024-02-21 – 2024-02-23 (×3): 100 mg via ORAL
  Filled 2024-02-21 (×5): qty 2

## 2024-02-21 MED ORDER — GLYCOPYRROLATE 0.2 MG/ML IJ SOLN
INTRAMUSCULAR | Status: AC
  Filled 2024-02-21: qty 1

## 2024-02-21 MED ORDER — PROPOFOL 500 MG/50ML IV EMUL
INTRAVENOUS | Status: DC | PRN
  Administered 2024-02-21: 110 ug/kg/min via INTRAVENOUS

## 2024-02-21 SURGICAL SUPPLY — 36 items
BENZOIN TINCTURE PRP APPL 2/3 (GAUZE/BANDAGES/DRESSINGS) IMPLANT
BNDG ADH 1X3 SHEER STRL LF (GAUZE/BANDAGES/DRESSINGS) IMPLANT
CABLE HIGH FREQUENCY MONO STRZ (ELECTRODE) ×1 IMPLANT
CATH FOLEY 2WAY SLVR 5CC 14FR (CATHETERS) IMPLANT
CHLORAPREP W/TINT 26 (MISCELLANEOUS) ×1 IMPLANT
CLIP APPLIE 5 13 M/L LIGAMAX5 (MISCELLANEOUS) IMPLANT
COVER SURGICAL LIGHT HANDLE (MISCELLANEOUS) ×1 IMPLANT
DERMABOND ADVANCED .7 DNX12 (GAUZE/BANDAGES/DRESSINGS) ×1 IMPLANT
DEVICE SECURE STRAP 25 ABSORB (INSTRUMENTS) ×1 IMPLANT
DEVICE TROCAR PUNCTURE CLOSURE (ENDOMECHANICALS) ×1 IMPLANT
ELECT REM PT RETURN 15FT ADLT (MISCELLANEOUS) ×1 IMPLANT
GLOVE BIO SURGEON STRL SZ7 (GLOVE) ×1 IMPLANT
GLOVE BIOGEL PI IND STRL 7.0 (GLOVE) ×1 IMPLANT
GLOVE SURG SS PI 7.0 STRL IVOR (GLOVE) ×1 IMPLANT
GOWN STRL REUS W/ TWL LRG LVL3 (GOWN DISPOSABLE) ×1 IMPLANT
GOWN STRL REUS W/ TWL XL LVL3 (GOWN DISPOSABLE) IMPLANT
IRRIGATION SUCT STRKRFLW 2 WTP (MISCELLANEOUS) IMPLANT
KIT BASIN OR (CUSTOM PROCEDURE TRAY) ×1 IMPLANT
KIT TURNOVER KIT A (KITS) IMPLANT
MARKER SKIN DUAL TIP RULER LAB (MISCELLANEOUS) ×1 IMPLANT
MESH 3DMAX MID 4X6 LT LRG (Mesh General) IMPLANT
SCISSORS LAP 5X35 DISP (ENDOMECHANICALS) IMPLANT
SET TUBE SMOKE EVAC HIGH FLOW (TUBING) ×1 IMPLANT
SLEEVE Z-THREAD 5X100MM (TROCAR) ×1 IMPLANT
SPIKE FLUID TRANSFER (MISCELLANEOUS) ×1 IMPLANT
STRIP CLOSURE SKIN 1/2X4 (GAUZE/BANDAGES/DRESSINGS) IMPLANT
SUT MNCRL AB 4-0 PS2 18 (SUTURE) ×1 IMPLANT
SUT STRATAFIX SPIRAL PDS3-0 (SUTURE) ×1 IMPLANT
SUT VIC AB 2-0 SH 27X BRD (SUTURE) ×1 IMPLANT
SUT VIC AB 2-0 SH 27XBRD (SUTURE) IMPLANT
SUT VICRYL 0 UR6 27IN ABS (SUTURE) IMPLANT
TOWEL OR 17X26 10 PK STRL BLUE (TOWEL DISPOSABLE) ×1 IMPLANT
TRAY FOLEY MTR SLVR 16FR STAT (SET/KITS/TRAYS/PACK) ×1 IMPLANT
TRAY LAPAROSCOPIC (CUSTOM PROCEDURE TRAY) ×1 IMPLANT
TROCAR KII 8X100ML NONTHREADED (TROCAR) ×1 IMPLANT
TROCAR Z-THREAD OPTICAL 5X100M (TROCAR) ×1 IMPLANT

## 2024-02-21 NOTE — Op Note (Signed)
 Preop diagnosis: left recurrent incarcerated inguinal hernia  Postop diagnosis: left recurrent incarcerated inguinal hernia  Procedure: laparoscopic left recurrent incarcerated inguinal hernia repair with mesh  Surgeon: Harman Lightning, M.D.  Asst: none  Anesthesia: Gen.   Indications for procedure: Todd Holmes is a 89 y.o. male with symptoms of pain and enlarging Left inguinal hernia(s). After discussing risks, alternatives and benefits he decided on laparoscopic repair and was brought to day surgery for repair.  Description of procedure: The patient was brought into the operative suite, placed supine. Anesthesia was administered with endotracheal tube. Patient was strapped in place. The patient was prepped and draped in the usual sterile fashion.  A small transverse incision was made in the left subcostal region. A 5mm trocar was used to gain access to the peritoneal cavity by optical entry technique. Pneumoperitoneum was applied with a high flow and low pressure. The laparoscope was reinserted to confirm position.  Next, Marcaine  was used for bilateral TAP blocks. 1 5 mm trocar was placed in the right mid abdomen. 1 8 mm trocar was placed epigastric midline. The patient was placed in trendelenberg position.  On initial visualization , the omentum was stuck into the large left indirect hernia sac. Cautery was used to divide the omentum. A piece of mesh was visualized superior to the hernia defect. A peritoneal flap was created on the left. This was continued medially to the pubic bone medially and laterally to muscles. Hernia sac was completely dissected out of the canal. Vas deference and contents of the cord were safely dissected away of the hernia sac. The inferior epigastric was divided between clips in order to fully dissect the mesh away from the muscle layers.  A left mid weight Bard 3D max mesh was inserted and sutured with 2-0 vicryl medially to the lacunar ligament. The mesh was  positioned flat and directly up against the direct and indirect areas. The peritoneal flap was sewn back up with running 3-0 strattafix suture. The hernia sac was sutured over the peritoneal closure to assist in protecting the visceral structures. The CO2 was evacuated while watching to ensure the mesh did not migrate. All skin incisions were closed with 4-0 monocryl subcu stitch. The patient awoke from anesthesia and was brought to PACU in stable condition.  Findings: left recurrent incarcerated left inguinal hernia  Specimen: none  Blood loss: 100 ml  Local anesthesia: 30 ml  Marcaine   Complications: none  Implant: left large mid weight Bard 3D max mesh  Harman Lightning, M.D. General, Bariatric, & Minimally Invasive Surgery [facility], [place] 10:16 AM 02/21/2024

## 2024-02-21 NOTE — Progress Notes (Signed)
 [facility] ROUNDING NOTE Todd Holmes EAV:[id number]  DOB: Jun 18, 1929  DOA: 02/20/2024  PCP: Todd Lathe, MD  02/21/2024,7:12 AM  LOS: 0 days    Code Status: DNR   from: Home current Dispo: Unclear   89 year old male Metastatic prostate cancer with bony mets diagnosed 2023 on Eligard  Zytiga  with transition to abiraterone  secondary to disease progression Mild cognitive decline Developed acute bulge left groin 02/20/2024 some nausea no vomit Remote history of bilateral inguinal repairs 1980s Workup revealed sodium 141 BUN/creatinine 23/1.0 LFTs normal WBC 5.7 hemoglobin 10 platelet 103 CT ABD pelvis left inguinal hernia containing loop of bowel mild dilatation upstream measuring 2.8 cm?  Small bowel obstruction from incarcerated hernia-Clark level lymphadenopathy aortic bifurcation decreased in size Heterogeneous sclerosis in the pelvis and throughout the lumbar spine = metastatic disease General surgery consulted plan for nonemergent operative management  Plan  Incarcerated but not strangulated inguinal hernia Defer to surgery planning Pain control Tylenol 650 every 6 as needed, oxy IR 5 every 6 as needed tramadol for moderate pain Saline 100 cc/HN saline lock when eating and drinking  Metastatic prostate cancer 2023 diagnosis Outpatient follow-up with Dr. Rosaline Holmes  Cognitive decline Relative at bedside tells me unable to manage ADLs unable to manage meds and house was in disarray Will get therapy to see may need short-term rehab Would continue Lexapro 10 for behavioral issues  Hypertension Resume metoprolol 100 XL daily, amlodipine 5  Lower extremity swelling Ruling out DVT  Discussed with family at the bedside  DVT prophylaxis: SCD  Status is: Inpatient Remains inpatient appropriate because:   May require 2 days to recover and placement   Subjective: He looks well just back from surgery no distress Family at bedside telling me that he has had some eye  discharge  Objective + exam Vitals:   02/21/24 0238 02/21/24 0239 02/21/24 0530 02/21/24 0618  BP:   130/82 (!) 144/88  Pulse:   83 85  Resp:   16 16  Temp:   98.2 F (36.8 C) 98.5 F (36.9 C)  TempSrc:   Oral Oral  SpO2:   97% 97%  Weight: 50.5 kg   50.5 kg  Height:  5\' 1"  (1.549 m)  5\' 1"  (1.549 m)   Filed Weights   02/21/24 0238 02/21/24 0618  Weight: 50.5 kg 50.5 kg    Examination: Awake coherent no distress but keeps eyes closed Chest is clear no wheeze rales rhonchi Abdominal soft no rebound no guarding S1-S2 no murmur  Data Reviewed: reviewed   CBC    Component Value Date/Time   WBC 5.4 02/21/2024 0328   RBC 3.38 (L) 02/21/2024 0328   HGB 10.4 (L) 02/21/2024 0328   HGB 9.9 (L) 02/02/2024 1503   HCT 32.8 (L) 02/21/2024 0328   PLT 100 (L) 02/21/2024 0328   PLT 94 (L) 02/02/2024 1503   MCV 97.0 02/21/2024 0328   MCH 30.8 02/21/2024 0328   MCHC 31.7 02/21/2024 0328   RDW 14.4 02/21/2024 0328   LYMPHSABS 0.5 (L) 02/02/2024 1503   MONOABS 0.4 02/02/2024 1503   EOSABS 0.1 02/02/2024 1503   BASOSABS 0.0 02/02/2024 1503      Latest Ref Rng & Units 02/21/2024    3:28 AM 02/20/2024    9:42 PM 02/02/2024    3:03 PM  CMP  Glucose 70 - 99 mg/dL 782  956  95   BUN 8 - 23 mg/dL 20  23  22    Creatinine 0.61 - 1.24 mg/dL 2.13  0.86  1.12   Sodium 135 - 145 mmol/L 138  141  141   Potassium 3.5 - 5.1 mmol/L 4.1  3.8  4.2   Chloride 98 - 111 mmol/L 103  106  105   CO2 22 - 32 mmol/L 22  21  30    Calcium 8.9 - 10.3 mg/dL 9.0  9.3  9.1   Total Protein 6.5 - 8.1 g/dL 7.3  7.6  7.1   Total Bilirubin 0.0 - 1.2 mg/dL 1.3  0.9  0.6   Alkaline Phos 38 - 126 U/L 58  62  63   AST 15 - 41 U/L 29  32  21   ALT 0 - 44 U/L 11  12  7      Scheduled Meds: Continuous Infusions:  sodium chloride      sodium chloride  100 mL/hr at 02/21/24 0157   lactated ringers  10 mL/hr at 02/21/24 0635   [MAR Hold] piperacillin-tazobactam (ZOSYN)  IV      Time  15  Todd Glisson, MD   [facility]

## 2024-02-21 NOTE — Transfer of Care (Signed)
 Immediate Anesthesia Transfer of Care Note  Patient: Todd Holmes  Procedure(s) Performed: LAPAROSCOPIC REPAIR OF INCARCERATED LEFT INGUINAL HERNIA WITH MESH (Left)  Patient Location: PACU  Anesthesia Type:General  Level of Consciousness: sedated and responds to stimulation  Airway & Oxygen Therapy: Patient Spontanous Breathing and Patient connected to nasal cannula oxygen  Post-op Assessment: Report given to RN and Post -op Vital signs reviewed and stable  Post vital signs: Reviewed and stable  Last Vitals:  Vitals Value Taken Time  BP 150/74 02/21/24 1025  Temp    Pulse 58 02/21/24 1026  Resp 23 02/21/24 1026  SpO2 100 % 02/21/24 1026  Vitals shown include unfiled device data.  Last Pain:  Vitals:   02/21/24 0618  TempSrc: Oral  PainSc: 3       Patients Stated Pain Goal: 0 (02/20/24 2127)  Complications: No notable events documented.

## 2024-02-21 NOTE — Plan of Care (Signed)
   Problem: Clinical Measurements: Goal: Diagnostic test results will improve Outcome: Progressing   Problem: Activity: Goal: Risk for activity intolerance will decrease Outcome: Progressing

## 2024-02-21 NOTE — Assessment & Plan Note (Signed)
 Obtain anemia panel  Transfuse for Hg <7 , rapidly dropping or  if symptomatic

## 2024-02-21 NOTE — Subjective & Objective (Signed)
 Pt caME IN  with abd pain, abd bulge, and some nausea, dry heaving, has hx of hernia sp prior repairs  Dyscomfort in LLQ Pt has large palpable area in left groin region

## 2024-02-21 NOTE — Assessment & Plan Note (Addendum)
 Appreciate general surgery consult keep n.p.o. Likely will need to proceed to OR in the near future For tonight started on Zosyn for now Pain control Rehydrate Check lactic acid

## 2024-02-21 NOTE — Anesthesia Preprocedure Evaluation (Addendum)
 Anesthesia Evaluation  Patient identified by MRN, date of birth, ID band  Reviewed: Allergy & Precautions, H&P , NPO status , Patient's Chart, lab work & pertinent test results  Airway Mallampati: II  TM Distance: >3 FB Neck ROM: Full    Dental no notable dental hx. (+) Poor Dentition, Dental Advisory Given   Pulmonary neg pulmonary ROS   Pulmonary exam normal breath sounds clear to auscultation       Cardiovascular hypertension, Pt. on medications and Pt. on home beta blockers Normal cardiovascular exam Rhythm:Regular Rate:Normal  EKG Sinus rhythm with 1st degree A-V block Right bundle branch block Left anterior fascicular block Bifascicular block Left ventricular hypertrophy with repolarization abnormality ( R in aVL , Romhilt-Estes )  ECHO  15"   - Left ventricle: The cavity size was normal. There was moderate    concentric hypertrophy. Systolic function was normal. The    estimated ejection fraction was in the range of 60% to 65%. Wall    motion was normal; there were no regional wall motion    abnormalities. Doppler parameters are consistent with abnormal    left ventricular relaxation (grade 1 diastolic dysfunction). The    E/e&' ratio is <8, suggesting normal LV filling pressure.  - Aortic valve: There was mild regurgitation. Regurgitation    pressure half-time: 897 ms.  - Aorta: The sinotubular junction is mildly dilated at 3.9 cm.  - Mitral valve: Mildly thickened leaflets . There was mild    regurgitation.  - Left atrium: The atrium was at the upper limits of normal in    size.     Neuro/Psych  Neuromuscular disease  negative psych ROS   GI/Hepatic negative GI ROS, Neg liver ROS,,,  Endo/Other  negative endocrine ROS    Renal/GU negative Renal ROS   History of prostrate cancer with radiation.    Musculoskeletal   Abdominal   Peds  Hematology  (+) Blood dyscrasia, anemia   Anesthesia Other  Findings   Reproductive/Obstetrics                             Anesthesia Physical Anesthesia Plan  ASA: 3  Anesthesia Plan: General   Post-op Pain Management: Minimal or no pain anticipated and Ofirmev IV (intra-op)*   Induction: Intravenous  PONV Risk Score and Plan: 2 and TIVA, Ondansetron and Treatment may vary due to age or medical condition  Airway Management Planned: Oral ETT  Additional Equipment: None  Intra-op Plan:   Post-operative Plan: Extubation in OR  Informed Consent: I have reviewed the patients History and Physical, chart, labs and discussed the procedure including the risks, benefits and alternatives for the proposed anesthesia with the patient or authorized representative who has indicated his/her understanding and acceptance.    Discussed DNR with patient, Discussed DNR with power of attorney and Continue DNR.     Plan Discussed with: CRNA and Anesthesiologist  Anesthesia Plan Comments: (  I No chest compressions. In Pre-Arrest Conditions (Patient Is Breathing and Has A Pulse) Do not intubate. Provide all appropriate non-invasive medical interventions. Avoid ICU transfer unless indicated or required. Consent: Discussion documented in EHR or advanced directives reviewed  )        Anesthesia Quick Evaluation

## 2024-02-21 NOTE — Assessment & Plan Note (Signed)
 Right leg edema will order doppler

## 2024-02-21 NOTE — H&P (Signed)
 Todd Holmes MWU:[id number] DOB: 10-14-1928 DOA: 02/20/2024     PCP: Arva Lathe, MD   Outpatient Specialists:    Oncology   Dr. Rosaline Coma   Patient arrived to ER on 02/20/24 at 2100 Referred by Attending Selene Dais, MD   Patient coming from:    home Lives alone     Chief Complaint:   Chief Complaint  Patient presents with   Abdominal Pain    HPI: Todd Holmes is a 89 y.o. male with medical history significant of HTN, prostate cancer, anemia    Presented with  LLQ abd pain  Pt caME IN  with abd pain, abd bulge, and some nausea, dry heaving, has hx of hernia sp prior repairs  Dyscomfort in LLQ Pt has large palpable area in left groin region     Reports the pain started just today Reports dry heaves and increase in belching no fever no chills  No CP no SOB No bleeding no diarrhea   Denies significant ETOH intake   Does not smoke       Regarding pertinent Chronic problems:       HTN on toprol, amlodipine   Chronic anemia - baseline hg Hemoglobin & Hematocrit  Recent Labs    12/08/23 1452 02/02/24 1503 02/20/24 2142  HGB 10.4* 9.9* 10.3*   Iron/TIBC/Ferritin/ %Sat No results found for: "IRON", "TIBC", "FERRITIN", "IRONPCTSAT"     Cancer: 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 There is mild dilation of the small bowel upstream from the herniated bowel measuring 2.8 cm. There is mild wall thickening and hyperenhancement of the small bowel in the hernia with adjacent fluid and trace stranding. Findings are concerning for early or partial small bowel obstruction from incarcerated hernia     Lab Orders         Lipase, blood         Comprehensive metabolic panel         CBC         Urinalysis, Routine w reflex microscopic -Urine, Clean Catch         CK         Magnesium         Phosphorus         Lactic acid, plasma          Protime-INR        CXR - ordered  CTabd/pelvis -  Findings are concerning for early or partial small bowel obstruction from incarcerated hernia. Surgical consult is recommended. Sequela of chronic pancreatitis.     Following Medications were ordered in ER: Medications  iohexol  (OMNIPAQUE ) 300 MG/ML solution 100 mL (100 mLs Intravenous Contrast Given 02/20/24 2236)    _______________________________________________________ ER Provider Called:    General Surgery Dr. Elvan Hamel They Recommend admit to medicine   Will see tonight Keep n.p.o. ED Triage Vitals  Encounter Vitals Group     BP 02/20/24 2106 137/86     Systolic BP Percentile --      Diastolic BP Percentile --      Pulse Rate 02/20/24 2106 (!) 105     Resp 02/20/24 2106 20     Temp 02/20/24 2106 98.1 F (36.7 C)     Temp Source 02/20/24 2106 Oral     SpO2 02/20/24 2106 96 %     Weight --  Height --      Head Circumference --      Peak Flow --      Pain Score 02/20/24 2127 4     Pain Loc --      Pain Education --      Exclude from Growth Chart --   RUEA(54)@     _________________________________________ Significant initial  Findings: Abnormal Labs Reviewed  COMPREHENSIVE METABOLIC PANEL WITH GFR - Abnormal; Notable for the following components:      Result Value   CO2 21 (*)    Glucose, Bld 171 (*)    All other components within normal limits  CBC - Abnormal; Notable for the following components:   RBC 3.41 (*)    Hemoglobin 10.3 (*)    HCT 33.6 (*)    Platelets 104 (*)    All other components within normal limits        ECG: Ordered  The recent clinical data is shown below. Vitals:   02/20/24 2106 02/21/24 0000  BP: 137/86 (!) 146/83  Pulse: (!) 105 74  Resp: 20 18  Temp: 98.1 F (36.7 C) 98.1 F (36.7 C)  TempSrc: Oral Oral  SpO2: 96% 100%    WBC     Component Value Date/Time   WBC 5.7 02/20/2024 2142   LYMPHSABS 0.5 (L) 02/02/2024 1503   MONOABS 0.4 02/02/2024 1503   EOSABS 0.1  02/02/2024 1503   BASOSABS 0.0 02/02/2024 1503     Lactic Acid, Venous Ordered   ABX started Zosyn    __________________________________________________________ Recent Labs  Lab 02/20/24 2142  NA 141  K 3.8  CO2 21*  GLUCOSE 171*  BUN 23  CREATININE 1.08  CALCIUM 9.3    Cr  stable,    Lab Results  Component Value Date   CREATININE 1.08 02/20/2024   CREATININE 1.12 02/02/2024   CREATININE 1.13 12/08/2023    Recent Labs  Lab 02/20/24 2142  AST 32  ALT 12  ALKPHOS 62  BILITOT 0.9  PROT 7.6  ALBUMIN 3.9   Lab Results  Component Value Date   CALCIUM 9.3 02/20/2024       Plt: Lab Results  Component Value Date   PLT 104 (L) 02/20/2024       Recent Labs  Lab 02/20/24 2142  WBC 5.7  HGB 10.3*  HCT 33.6*  MCV 98.5  PLT 104*    HG/HCT stable,      Component Value Date/Time   HGB 10.3 (L) 02/20/2024 2142   HGB 9.9 (L) 02/02/2024 1503   HCT 33.6 (L) 02/20/2024 2142   MCV 98.5 02/20/2024 2142    Recent Labs  Lab 02/20/24 2142  LIPASE 21   No results for input(s): "AMMONIA" in the last 168 hours.    _______________________________________________ Hospitalist was called for admission for   Incarcerated hernia A partial small bowel obstruction    The following Work up has been ordered so far:  Orders Placed This Encounter  Procedures   CT ABDOMEN PELVIS W CONTRAST   Lipase, blood   Comprehensive metabolic panel   CBC   Urinalysis, Routine w reflex microscopic -Urine, Clean Catch   CK   Magnesium   Phosphorus   Lactic acid, plasma   Protime-INR   Diet NPO time specified   Cardiac Monitoring Continuous x 12 hours Indications for use: Other; other indications for use: INCARCERATED HERNIA   Consult to general surgery   Consult to hospitalist   Admit to Inpatient (patient's expected length  of stay will be greater than 2 midnights or inpatient only procedure)     OTHER Significant initial  Findings:  labs showing:          Cultures: No results found for: "SDES", "SPECREQUEST", "CULT", "REPTSTATUS"   Radiological Exams on Admission: CT ABDOMEN PELVIS W CONTRAST Result Date: 02/20/2024 CLINICAL DATA:  Left lower quadrant abdominal pain EXAM: CT ABDOMEN AND PELVIS WITH CONTRAST TECHNIQUE: Multidetector CT imaging of the abdomen and pelvis was performed using the standard protocol following bolus administration of intravenous contrast. RADIATION DOSE REDUCTION: This exam was performed according to the departmental dose-optimization program which includes automated exposure control, adjustment of the mA and/or kV according to patient size and/or use of iterative reconstruction technique. CONTRAST:  OMNIPAQUE  IOHEXOL  300 MG/ML  SOLN COMPARISON:  CT abdomen pelvis 10/15/2021 and PET/CT 03/01/2023 FINDINGS: Lower chest: No acute abnormality. Hepatobiliary: Heterogenous perfusion in the liver. No acute abnormality. Unremarkable gallbladder and biliary tree. Pancreas: Atrophic. Coarse calcifications. Prominent pancreatic duct measuring 5 mm. No acute abnormality. Spleen: Unremarkable. Adrenals/Urinary Tract: Unremarkable adrenal glands. No urinary calculi or hydronephrosis. Unremarkable bladder. Stomach/Bowel: Stomach is within normal limits. Normal caliber large and small bowel. Extensive colonic diverticulosis without evidence of diverticulitis. Herniation of a loop of small bowel into the left inguinal hernia. There is mild dilation of the small bowel upstream from the herniated bowel measuring 2.8 cm. There is mild wall thickening and hyperenhancement of the small bowel in the hernia with adjacent fluid and trace stranding. Vascular/Lymphatic: Aortic atherosclerotic calcification. Confluent lymphadenopathy at the aortic bifurcation measures 2.1 x 4.6 cm this has decreased in size from PET/CT 03/01/2023 when it measured 3.1 x 5.3 cm using similar to measuring technique. Reproductive: Fiducial markers about the prostate. Other: No  free intraperitoneal air. Musculoskeletal: No acute fracture. Increased heterogenous sclerosis in the pelvis and visualized thoracolumbar spine compatible with metastatic disease. IMPRESSION: 1. Left inguinal hernia containing a loop of small bowel. There is mild dilation of the small bowel upstream from the herniated bowel measuring 2.8 cm. There is mild wall thickening and hyperenhancement of the small bowel in the hernia with adjacent fluid and trace stranding. Findings are concerning for early or partial small bowel obstruction from incarcerated hernia. Surgical consult is recommended. 2. Confluent lymphadenopathy at the aortic bifurcation has decreased in size from PET/CT 03/01/2023. 3. Increased heterogenous sclerosis in the pelvis and visualized thoracolumbar spine compatible with metastatic disease plus or minus post treatment change. 4. Sequela of chronic pancreatitis. 5. Aortic Atherosclerosis (ICD10-I70.0). Electronically Signed   By: Rozell Cornet M.D.   On: 02/20/2024 23:27   _______________________________________________________________________________________________________ Latest  Blood pressure (!) 146/83, pulse 74, temperature 98.1 F (36.7 C), temperature source Oral, resp. rate 18, SpO2 100%.   Vitals  labs and radiology finding personally reviewed  Review of Systems:    Pertinent positives include:  abdominal pain, nausea, vomiting,   Constitutional:  No weight loss, night sweats, Fevers, chills, fatigue, weight loss  HEENT:  No headaches, Difficulty swallowing,Tooth/dental problems,Sore throat,  No sneezing, itching, ear ache, nasal congestion, post nasal drip,  Cardio-vascular:  No chest pain, Orthopnea, PND, anasarca, dizziness, palpitations.no Bilateral lower extremity swelling  GI:  No heartburn, indigestion, diarrhea, change in bowel habits, loss of appetite, melena, blood in stool, hematemesis Resp:  no shortness of breath at rest. No dyspnea on exertion, No  excess mucus, no productive cough, No non-productive cough, No coughing up of blood.No change in color of mucus.No wheezing. Skin:  no rash or  lesions. No jaundice GU:  no dysuria, change in color of urine, no urgency or frequency. No straining to urinate.  No flank pain.  Musculoskeletal:  No joint pain or no joint swelling. No decreased range of motion. No back pain.  Psych:  No change in mood or affect. No depression or anxiety. No memory loss.  Neuro: no localizing neurological complaints, no tingling, no weakness, no double vision, no gait abnormality, no slurred speech, no confusion  All systems reviewed and apart from HOPI all are negative _______________________________________________________________________________________________ Past Medical History:   Past Medical History:  Diagnosis Date   Arthritis    knees   Cancer (HCC)    prostate, tx 2005- radiation    Hypertension    Keloid    keloid across chest, pt. unsure of how he encountered it   Memory disorder 11/28/2014   Neuromuscular disorder (HCC)    benign- tremor- occas., treated /w nadolol   Tremor 11/28/2014      Past Surgical History:  Procedure Laterality Date   CATARACT EXTRACTION W/PHACO  09/23/2011   Procedure: CATARACT EXTRACTION PHACO AND INTRAOCULAR LENS PLACEMENT (IOC);  Surgeon: Ben Bracken, MD;  Location: [facility] OR;  Service: Ophthalmology;  Laterality: Right;   EYE SURGERY Bilateral    L cataract removed & IOL   HERNIA REPAIR     bilateral hernia repair, inguinal, 1980's      Social History:  Ambulatory cane,      reports that he has never smoked. He has never used smokeless tobacco. He reports that he does not drink alcohol and does not use drugs.   Family History:   Family History  Problem Relation Age of Onset   Anesthesia problems Neg Hx    Hypotension Neg Hx    Malignant hyperthermia Neg Hx    Pseudochol deficiency Neg Hx    Cancer Mother    Stroke Father    Lymphoma Brother     ______________________________________________________________________________________________ Allergies: No Known Allergies   Prior to Admission medications   Medication Sig Start Date End Date Taking? Authorizing Provider  amLODipine (NORVASC) 5 MG tablet Take 5 mg by mouth daily.    [provider]  escitalopram (LEXAPRO) 10 MG tablet Take 10 mg by mouth daily. 05/07/22   [provider]  metoprolol succinate (TOPROL-XL) 100 MG 24 hr tablet Take 100 mg by mouth daily. 05/07/22   [provider]  Multiple Vitamins-Minerals (MULTIVITAMINS THER. W/MINERALS) TABS Take 1 tablet by mouth daily.      [provider]  MYRBETRIQ 25 MG TB24 tablet Take 25 mg by mouth daily. 05/07/22   [provider]  ondansetron (ZOFRAN) 4 MG tablet Take 4 mg by mouth as needed for nausea or vomiting. Patient not taking: Reported on 10/27/2023    [provider]  ZYLET 0.5-0.3 % SUSP Apply to eye. 06/22/23   [provider]    ___________________________________________________________________________________________________ Physical Exam:    02/21/2024   12:00 AM 02/20/2024    9:06 PM 02/02/2024    3:52 PM  Vitals with BMI  Weight   119 lbs  Systolic 146 137 811  Diastolic 83 86 63  Pulse 74 105 53     1. General:  in No  Acute distress    Chronically ill  -appearing 2. Psychological: Alert and   Oriented 3. Head/ENT:    Dry Mucous Membranes  Head Non traumatic, neck supple                           Poor Dentition 4. SKIN:  decreased Skin turgor,  Skin clean Dry and intact no rash    5. Heart: Regular rate and rhythm no  Murmur, no Rub or gallop 6. Lungs:  Clear to auscultation bilaterally, no wheezes or crackles   7. Abdomen: Soft, LLQ -tender,   distended hyperactive bowel sounds  8. Lower extremities: no clubbing, cyanosis,  R > L edema 9. Neurologically Grossly intact, moving all 4 extremities equally   10.  MSK: Normal range of motion    Chart has been reviewed  ______________________________________________________________________________________________  Assessment/Plan  89 y.o. 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  Appreciate general surgery consult at this point patient no longer vomiting.  Hold off on NG tube  Leg edema, right Right leg edema will order doppler    Other plan as per orders.  DVT prophylaxis:  SCD      Code Status:   DNR/DNI   as per patient   I had personally discussed CODE STATUS with patient and family  ACP      has been reviewed   Pt at this time wants to change code status to DNR   Family Communication:   Family   at  Bedside  plan of care was discussed with cousin  Diet  Diet Orders (From admission, onward)     Start     Ordered   02/20/24 2129  Diet NPO time specified  Diet effective now        02/20/24 2128            Disposition Plan:       To home once workup is complete and patient is stable   Following barriers for discharge:                                                                                    Pain controlled with PO medications  Will need to be able to tolerate PO                                                        Will need consultants to evaluate patient prior to discharge                                 Consult Orders  (From admission, onward)           Start     Ordered   02/21/24 0042  Consult to hospitalist  Once       Provider:  (Not yet assigned)  Question Answer Comment  Place call to: [facility]   Reason for Consult Admit      02/21/24 0041                               Would benefit from PT/OT eval prior to DC  Ordered                     Consults called:   Treatment Team:  Ccs, Md, MD  Admission status:  ED Disposition     ED Disposition  Admit   Condition  --   Comment  [facility] [[id number]  Level of Care: Telemetry [5]  Admit to tele based on following criteria: Other see comments  Comments: incaRCERATED HERNIA  May admit patient to [facility] or [facility] if equivalent level of care is available:: No  Covid Evaluation: Asymptomatic - no recent exposure (last 10 days) testing not required  Diagnosis: Incarcerated hernia [[id number]]  Admitting Physician: Aldrin Engelhard [[id number]]  Attending Physician: Kekoa Fyock [[id number]]  Certification:: I certify this patient will need inpatient services for at least 2 midnights               inpatient     I Expect 2 midnight stay secondary to severity of patient's current illness need for inpatient interventions justified by the following:     Severe lab/radiological/exam abnormalities including:    Incarcerated hernia    and extensive comorbidities including:   malignancy,   That are currently affecting medical management.   I expect  patient to be hospitalized for 2 midnights requiring inpatient medical  care.  Patient is at high risk for adverse outcome (such as loss of life or disability) if not treated.  Indication for inpatient stay as follows:  ,   inability to maintain oral hydration   Need for operative/procedural  intervention    Need for IV antibiotics, IV fluids,, IV pain medications     Level of care     tele  For  24H         Karlin Binion 02/21/2024, 1:19 AM    [facility]     after 2 AM please page floor coverage   If 7AM-7PM, please contact the day team taking care of the patient using [email or web address]

## 2024-02-21 NOTE — Progress Notes (Signed)
 OT Cancellation Note  Patient Details Name: Todd Holmes MRN: [id number] DOB: 07/13/1929   Cancelled Treatment:    Reason Eval/Treat Not Completed: Patient at procedure or test/ unavailable Patient is off the hall for procedure. OT to continue to follow and check back as schedule will allow.  Wynette Heckler, MS Acute Rehabilitation Department Office# [phone]  02/21/2024, 6:44 AM

## 2024-02-21 NOTE — Assessment & Plan Note (Signed)
Chronic followed by oncology

## 2024-02-21 NOTE — Anesthesia Postprocedure Evaluation (Signed)
 Anesthesia Post Note  Patient: Todd Holmes  Procedure(s) Performed: LAPAROSCOPIC REPAIR OF INCARCERATED LEFT INGUINAL HERNIA WITH MESH (Left)     Patient location during evaluation: PACU Anesthesia Type: General Level of consciousness: awake and alert Pain management: pain level controlled Vital Signs Assessment: post-procedure vital signs reviewed and stable Respiratory status: spontaneous breathing, nonlabored ventilation, respiratory function stable and patient connected to nasal cannula oxygen Cardiovascular status: blood pressure returned to baseline and stable Postop Assessment: no apparent nausea or vomiting Anesthetic complications: no   No notable events documented.  Last Vitals:  Vitals:   02/21/24 1145 02/21/24 1234  BP: 121/70 124/73  Pulse: (!) 55 (!) 57  Resp: 18 15  Temp:    SpO2: 97% 100%    Last Pain:  Vitals:   02/21/24 1234  TempSrc:   PainSc: 0-No pain                 Author [place]

## 2024-02-21 NOTE — Progress Notes (Signed)
 Pre Procedure note for inpatients:   Todd Holmes has been scheduled for laparoscopic left inguinal hernia repair with mesh today. The various methods of treatment have been discussed with the patient. After consideration of the risks, benefits and treatment options the patient has consented to the planned procedure.   The patient has been seen and labs reviewed. There are no changes in the patient's condition to prevent proceeding with the planned procedure today.  Recent labs:  Lab Results  Component Value Date   WBC 5.4 02/21/2024   HGB 10.4 (L) 02/21/2024   HCT 32.8 (L) 02/21/2024   PLT 100 (L) 02/21/2024   GLUCOSE 134 (H) 02/21/2024   ALT 11 02/21/2024   AST 29 02/21/2024   NA 138 02/21/2024   K 4.1 02/21/2024   CL 103 02/21/2024   CREATININE 0.87 02/21/2024   BUN 20 02/21/2024   CO2 22 02/21/2024   INR 1.2 02/21/2024    Derral Flick, MD 02/21/2024 7:56 AM

## 2024-02-21 NOTE — Anesthesia Procedure Notes (Signed)
 Procedure Name: Intubation Date/Time: 02/21/2024 8:16 AM  Performed by: Darlena Ego, CRNAPre-anesthesia Checklist: Patient identified, Emergency Drugs available, Suction available and Patient being monitored Patient Re-evaluated:Patient Re-evaluated prior to induction Oxygen Delivery Method: Circle System Utilized Preoxygenation: Pre-oxygenation with 100% oxygen Induction Type: IV induction Ventilation: Mask ventilation without difficulty Laryngoscope Size: Miller and 2 Grade View: Grade II Tube type: Oral Number of attempts: 1 Airway Equipment and Method: Stylet and Oral airway Placement Confirmation: ETT inserted through vocal cords under direct vision, positive ETCO2 and breath sounds checked- equal and bilateral Secured at: 23 cm Tube secured with: Tape Dental Injury: Teeth and Oropharynx as per pre-operative assessment

## 2024-02-21 NOTE — Assessment & Plan Note (Signed)
 In the setting of incarcerated hernia.  Appreciate general surgery consult at this point patient no longer vomiting.  Hold off on NG tube

## 2024-02-21 NOTE — Consult Note (Signed)
 CC: left groin bulge  Requesting provider: Ronette Coffer  HPI: Todd Holmes is an 89 y.o. male who is here for acute onset of a bulge in his left groin on Sunday morning.  He came to the emergency room because it was causing discomfort and the bulge would not go away.  Had some nausea but no vomiting.  Patient has a past medical history significant for hypertension, metastatic prostate cancer.  He underwent radiation for his prostate cancer and most recently underwent Pluvicto  treatment with the last 1 on April 3.  Patient has a very remote history of bilateral inguinal hernia repairs in the 80s.  Does not take a blood thinner.  Last bowel movement was Sunday morning.  Reports normal urination.  Past Medical History:  Diagnosis Date   Arthritis    knees   Cancer (HCC)    prostate, tx 2005- radiation    Hypertension    Keloid    keloid across chest, pt. unsure of how he encountered it   Memory disorder 11/28/2014   Neuromuscular disorder (HCC)    benign- tremor- occas., treated /w nadolol   Tremor 11/28/2014    Past Surgical History:  Procedure Laterality Date   CATARACT EXTRACTION W/PHACO  09/23/2011   Procedure: CATARACT EXTRACTION PHACO AND INTRAOCULAR LENS PLACEMENT (IOC);  Surgeon: Ben Bracken, MD;  Location: [facility] OR;  Service: Ophthalmology;  Laterality: Right;   EYE SURGERY Bilateral    L cataract removed & IOL   HERNIA REPAIR     bilateral hernia repair, inguinal, 1980's      Family History  Problem Relation Age of Onset   Anesthesia problems Neg Hx    Hypotension Neg Hx    Malignant hyperthermia Neg Hx    Pseudochol deficiency Neg Hx    Cancer Mother    Stroke Father    Lymphoma Brother     Social:  reports that he has never smoked. He has never used smokeless tobacco. He reports that he does not drink alcohol and does not use drugs.  Allergies: No Known Allergies  Medications: I have reviewed the patient's current medications.   ROS - all of the below  systems have been reviewed with the patient and positives are indicated with bold text General: chills, fever or night sweats Eyes: blurry vision or double vision ENT: epistaxis or sore throat Allergy/Immunology: itchy/watery eyes or nasal congestion Hematologic/Lymphatic: bleeding problems, blood clots or swollen lymph nodes Endocrine: temperature intolerance or unexpected weight changes Breast: new or changing breast lumps or nipple discharge Resp: cough, shortness of breath, or wheezing CV: chest pain or dyspnea on exertion GI: as per HPI GU: dysuria, trouble voiding, or hematuria MSK: joint pain or joint stiffness Neuro: TIA or stroke symptoms Derm: pruritus and skin lesion changes Psych: anxiety and depression  PE Blood pressure (!) 146/84, pulse 92, temperature 98.5 F (36.9 C), resp. rate 18, height 5\' 1"  (1.549 m), weight 50.5 kg, SpO2 100%. Constitutional: NAD; conversant; no deformities Eyes: Moist conjunctiva; no lid lag; anicteric; PERRL Neck: Trachea midline; no thyromegaly Lungs: Normal respiratory effort; no tactile fremitus CV: RRR; no palpable thrills; no pitting edema GI: Abd soft, nt, nd; no palpable hepatosplenomegaly Bulge LLQ/inguinal/ nonreducible, some TTP when try to reduce. No cellulitis MSK: ; no clubbing/cyanosis Psychiatric: Appropriate affect; alert and oriented x3 Lymphatic: No palpable cervical or axillary lymphadenopathy Skin:no rash  Results for orders placed or performed during the hospital encounter of 02/20/24 (from the past 48 hours)  Lipase, blood  Status: None   Collection Time: 02/20/24  9:42 PM  Result Value Ref Range   Lipase 21 11 - 51 U/L    Comment: Performed at [facility], [address], [place], [place] [address]  Comprehensive metabolic panel     Status: Abnormal   Collection Time: 02/20/24  9:42 PM  Result Value Ref Range   Sodium 141 135 - 145 mmol/L   Potassium 3.8 3.5 - 5.1 mmol/L   Chloride 106  98 - 111 mmol/L   CO2 21 (L) 22 - 32 mmol/L   Glucose, Bld 171 (H) 70 - 99 mg/dL    Comment: Glucose reference range applies only to samples taken after fasting for at least 8 hours.   BUN 23 8 - 23 mg/dL   Creatinine, Ser 6.04 0.61 - 1.24 mg/dL   Calcium 9.3 8.9 - 54.0 mg/dL   Total Protein 7.6 6.5 - 8.1 g/dL   Albumin 3.9 3.5 - 5.0 g/dL   AST 32 15 - 41 U/L   ALT 12 0 - 44 U/L   Alkaline Phosphatase 62 38 - 126 U/L   Total Bilirubin 0.9 0.0 - 1.2 mg/dL   GFR, Estimated >98 >11 mL/min    Comment: (NOTE) Calculated using the CKD-EPI Creatinine Equation (2021)    Anion gap 14 5 - 15    Comment: Performed at [facility], [address], [place], [place] [address]  CBC     Status: Abnormal   Collection Time: 02/20/24  9:42 PM  Result Value Ref Range   WBC 5.7 4.0 - 10.5 K/uL   RBC 3.41 (L) 4.22 - 5.81 MIL/uL   Hemoglobin 10.3 (L) 13.0 - 17.0 g/dL   HCT 29.5 (L) 62.1 - 30.8 %   MCV 98.5 80.0 - 100.0 fL   MCH 30.2 26.0 - 34.0 pg   MCHC 30.7 30.0 - 36.0 g/dL   RDW 65.7 84.6 - 96.2 %   Platelets 104 (L) 150 - 400 K/uL   nRBC 0.0 0.0 - 0.2 %    Comment: Performed at [facility], [address], [place], [place] [address]  Lactic acid, plasma     Status: None   Collection Time: 02/21/24  1:05 AM  Result Value Ref Range   Lactic Acid, Venous 1.7 0.5 - 1.9 mmol/L    Comment: Performed at [facility], [address], [place], [place] [address]  Protime-INR     Status: None   Collection Time: 02/21/24  1:05 AM  Result Value Ref Range   Prothrombin Time 15.1 11.4 - 15.2 seconds   INR 1.2 0.8 - 1.2    Comment: (NOTE) INR goal varies based on device and disease states. Performed at [facility], [address] [place], [place] [address]     DG CHEST PORT 1 VIEW Result Date: 02/21/2024 CLINICAL DATA:  Preoperative examination. EXAM: PORTABLE CHEST 1 VIEW COMPARISON:  09/11/2011 FINDINGS: Cardiac shadow is  stable. Tortuous thoracic aorta is again seen. The lungs are clear. No bony abnormality is noted. IMPRESSION: No acute abnormality noted. Electronically Signed   By: Violeta Grey M.D.   On: 02/21/2024 01:42   CT ABDOMEN PELVIS W CONTRAST Result Date: 02/20/2024 CLINICAL DATA:  Left lower quadrant abdominal pain EXAM: CT ABDOMEN AND PELVIS WITH CONTRAST TECHNIQUE: Multidetector CT imaging of the abdomen and pelvis was performed using the standard protocol following bolus administration of intravenous contrast. RADIATION DOSE REDUCTION: This exam was  performed according to the departmental dose-optimization program which includes automated exposure control, adjustment of the mA and/or kV according to patient size and/or use of iterative reconstruction technique. CONTRAST:  OMNIPAQUE  IOHEXOL  300 MG/ML  SOLN COMPARISON:  CT abdomen pelvis 10/15/2021 and PET/CT 03/01/2023 FINDINGS: Lower chest: No acute abnormality. Hepatobiliary: Heterogenous perfusion in the liver. No acute abnormality. Unremarkable gallbladder and biliary tree. Pancreas: Atrophic. Coarse calcifications. Prominent pancreatic duct measuring 5 mm. No acute abnormality. Spleen: Unremarkable. Adrenals/Urinary Tract: Unremarkable adrenal glands. No urinary calculi or hydronephrosis. Unremarkable bladder. Stomach/Bowel: Stomach is within normal limits. Normal caliber large and small bowel. Extensive colonic diverticulosis without evidence of diverticulitis. Herniation of a loop of small bowel into the left inguinal hernia. There is mild dilation of the small bowel upstream from the herniated bowel measuring 2.8 cm. There is mild wall thickening and hyperenhancement of the small bowel in the hernia with adjacent fluid and trace stranding. Vascular/Lymphatic: Aortic atherosclerotic calcification. Confluent lymphadenopathy at the aortic bifurcation measures 2.1 x 4.6 cm this has decreased in size from PET/CT 03/01/2023 when it measured 3.1 x 5.3 cm  using similar to measuring technique. Reproductive: Fiducial markers about the prostate. Other: No free intraperitoneal air. Musculoskeletal: No acute fracture. Increased heterogenous sclerosis in the pelvis and visualized thoracolumbar spine compatible with metastatic disease. IMPRESSION: 1. Left inguinal hernia containing a loop of small bowel. There is mild dilation of the small bowel upstream from the herniated bowel measuring 2.8 cm. There is mild wall thickening and hyperenhancement of the small bowel in the hernia with adjacent fluid and trace stranding. Findings are concerning for early or partial small bowel obstruction from incarcerated hernia. Surgical consult is recommended. 2. Confluent lymphadenopathy at the aortic bifurcation has decreased in size from PET/CT 03/01/2023. 3. Increased heterogenous sclerosis in the pelvis and visualized thoracolumbar spine compatible with metastatic disease plus or minus post treatment change. 4. Sequela of chronic pancreatitis. 5. Aortic Atherosclerosis (ICD10-I70.0). Electronically Signed   By: Rozell Cornet M.D.   On: 02/20/2024 23:27    Imaging: Personally   A/P: ARY LAVINE is an 89 y.o. male with  Recurrent incarcerated Left inguinal hernia Metastatic prostate cancer HTN Anemia of chronic disease   Patient has a recurrent left inguinal hernia which is incarcerated with mild partial small bowel obstruction.  He is resting comfortably.  He is nontoxic-appearing.  No leukocytosis.  No fever or tachycardia.  No clinical signs of strangulation.  His cousin is at the bedside.  They state another cousin lives in [place]  has power of attorney if needed but the patient and the cousin at the bedside states that the patient can sign his own paperwork.  In reviewing the CT scan it appears that he has some type of permanent mesh with radiopaque fixation tacks.  Patient will need to go the operating room later this morning.  I do not think he needs  to go at 3 AM.  Operative technique will be at the discretion of Dr. Bishop Bullock.  May be easier to approach this laparoscopically given potential scar tissue from his prior open even so with a laparoscopic approach there may be potential risk for conversion to open  Briefly discussed some of the risk of the procedure with the patient and cousin at the bedside such as bleeding, infection, injury to surrounding structures, potential bowel resection, hernia recurrence, seroma/hematoma, injury to testicular vessels, ileus.  Explained that the operative surgeon will go over the risk in more detail  Data reviewed:  I reviewed ER notes, vital signs, labs, CT imaging, [facility] hospitalist, most recent medical oncology note  Marianna Shirk. Elvan Hamel, MD, FACS General, Bariatric, & Minimally Invasive Surgery [facility]

## 2024-02-21 NOTE — Assessment & Plan Note (Signed)
 Allow permissive hypertension

## 2024-02-21 NOTE — Progress Notes (Signed)
 Right lower extremity venous duplex has been completed. Preliminary results can be found in CV Proc through chart review.   02/21/24 1:02 PM Birda Buffy RVT

## 2024-02-21 NOTE — TOC Initial Note (Signed)
 Transition of Care [facility]) - Initial/Assessment Note    Patient Details  Name: Todd Holmes MRN: [id number] Date of Birth: 1929-06-02  Transition of Care [facility]) CM/SW Contact:    Bari Leys, RN Phone Number: 02/21/2024, 1:30 PM  Clinical Narrative:  NCM met with patient and his cousin, [name], at bedside to introduce role of TOC/NCM and review for dc planning, patient lying in bed with cloth over his face, [name] answered assessment questions, confirmed patient resides alone, states she makes meals for patient and checks on him during the week, reports patient uses a cane for functional mobility with a few falls and is mostly independent. [name] states she and other family members are discussing future  plans/placement for the patient. PT eval pending, await recommendation. TOC will continue to follow.                  Expected Discharge Plan: Home w Home Health Services Barriers to Discharge: Continued Medical Work up   Patient Goals and CMS Choice Patient states their goals for this hospitalization and ongoing recovery are:: return home          Expected Discharge Plan and Services       Living arrangements for the past 2 months: Single Family Home                                      Prior Living Arrangements/Services Living arrangements for the past 2 months: Single Family Home Lives with:: Self Patient language and need for interpreter reviewed:: Yes Do you feel safe going back to the place where you live?: Yes      Need for Family Participation in Patient Care: Yes (Comment) Care giver support system in place?: Yes (comment) Current home services: DME (cane) Criminal Activity/Legal Involvement Pertinent to Current Situation/Hospitalization: No - Comment as needed  Activities of Daily Living   ADL Screening (condition at time of admission) Independently performs ADLs?: Yes (appropriate for developmental age) Is the patient deaf or have difficulty  hearing?: Yes Does the patient have difficulty seeing, even when wearing glasses/contacts?: No Does the patient have difficulty concentrating, remembering, or making decisions?: No  Permission Sought/Granted                  Emotional Assessment Appearance:: Appears stated age Attitude/Demeanor/Rapport: Unable to Assess Affect (typically observed): Unable to Assess   Alcohol / Substance Use: Not Applicable Psych Involvement: No (comment)  Admission diagnosis:  Incarcerated hernia [K46.0] Patient Active Problem List   Diagnosis Date Noted   Incarcerated hernia 02/21/2024   Essential hypertension 02/21/2024   Anemia 02/21/2024   SBO (small bowel obstruction) (HCC) 02/21/2024   Leg edema, right 02/21/2024   Metastatic castration-resistant adenocarcinoma of prostate (HCC) 05/12/2022   Memory disorder 11/28/2014   Tremor 11/28/2014   PCP:  Arva Lathe, MD Pharmacy:   [facility] [address] [place] - [address] [place] [address] Phone: [phone] Fax: [phone]  [place] [facility] [address] [place] [address] Phone: [phone] Fax: [phone]     Social Drivers of Health (SDOH) Social History: SDOH Screenings   Food Insecurity: No Food Insecurity (02/21/2024)  Housing: Low Risk  (02/21/2024)  Transportation Needs: No Transportation Needs (02/21/2024)  Utilities: Not At Risk (02/21/2024)  Social Connections: Unknown (02/21/2024)  Tobacco Use: Low Risk  (02/21/2024)  SDOH Interventions:     Readmission Risk Interventions    02/21/2024    1:28 PM  Readmission Risk Prevention Plan  Post Dischage Appt Complete  Medication Screening Complete  Transportation Screening Complete

## 2024-02-21 NOTE — Assessment & Plan Note (Signed)
 Monitor for any sign of sundowning

## 2024-02-22 ENCOUNTER — Encounter (HOSPITAL_COMMUNITY): Payer: Self-pay | Admitting: General Surgery

## 2024-02-22 DIAGNOSIS — K460 Unspecified abdominal hernia with obstruction, without gangrene: Secondary | ICD-10-CM | POA: Diagnosis not present

## 2024-02-22 LAB — CBC WITH DIFFERENTIAL/PLATELET
Abs Immature Granulocytes: 0.02 10*3/uL (ref 0.00–0.07)
Basophils Absolute: 0 10*3/uL (ref 0.0–0.1)
Basophils Relative: 0 %
Eosinophils Absolute: 0 10*3/uL (ref 0.0–0.5)
Eosinophils Relative: 0 %
HCT: 27.1 % — ABNORMAL LOW (ref 39.0–52.0)
Hemoglobin: 8.8 g/dL — ABNORMAL LOW (ref 13.0–17.0)
Immature Granulocytes: 0 %
Lymphocytes Relative: 6 %
Lymphs Abs: 0.4 10*3/uL — ABNORMAL LOW (ref 0.7–4.0)
MCH: 31.1 pg (ref 26.0–34.0)
MCHC: 32.5 g/dL (ref 30.0–36.0)
MCV: 95.8 fL (ref 80.0–100.0)
Monocytes Absolute: 0.7 10*3/uL (ref 0.1–1.0)
Monocytes Relative: 13 %
Neutro Abs: 4.6 10*3/uL (ref 1.7–7.7)
Neutrophils Relative %: 81 %
Platelets: 89 10*3/uL — ABNORMAL LOW (ref 150–400)
RBC: 2.83 MIL/uL — ABNORMAL LOW (ref 4.22–5.81)
RDW: 14.2 % (ref 11.5–15.5)
WBC: 5.7 10*3/uL (ref 4.0–10.5)
nRBC: 0 % (ref 0.0–0.2)

## 2024-02-22 LAB — BASIC METABOLIC PANEL WITH GFR
Anion gap: 8 (ref 5–15)
BUN: 20 mg/dL (ref 8–23)
CO2: 22 mmol/L (ref 22–32)
Calcium: 8.2 mg/dL — ABNORMAL LOW (ref 8.9–10.3)
Chloride: 105 mmol/L (ref 98–111)
Creatinine, Ser: 0.92 mg/dL (ref 0.61–1.24)
GFR, Estimated: >60 mL/min (ref >60)
Glucose, Bld: 109 mg/dL — ABNORMAL HIGH (ref 70–99)
Potassium: 3.7 mmol/L (ref 3.5–5.1)
Sodium: 135 mmol/L (ref 135–145)

## 2024-02-22 MED ORDER — ENSURE PLUS HIGH PROTEIN PO LIQD
237.0000 mL | Freq: Two times a day (BID) | ORAL | Status: DC
  Administered 2024-02-22 – 2024-02-24 (×2): 237 mL via ORAL

## 2024-02-22 MED ORDER — POLYETHYLENE GLYCOL 3350 17 G PO PACK
17.0000 g | PACK | Freq: Every day | ORAL | Status: DC
  Administered 2024-02-22 – 2024-02-24 (×3): 17 g via ORAL
  Filled 2024-02-22 (×3): qty 1

## 2024-02-22 NOTE — NC FL2 (Signed)
 [place]  MEDICAID FL2 LEVEL OF CARE FORM     IDENTIFICATION  Patient Name: Todd Holmes Birthdate: Aug 20, 1929 Sex: male Admission Date (Current Location): 02/20/2024  [facility] and [place] Number:  Producer, television/film/video and Address:  [facility],  [address] [place], [place] [address]      Provider Number: [id number]  Attending Physician Name and Address:  Verlie Glisson, MD  Relative Name and Phone Number:  Ledon Pry)  [phone] [facility])    Current Level of Care: Hospital Recommended Level of Care: Skilled Nursing Facility Prior Approval Number:    Date Approved/Denied:   PASRR Number: [id number]  Discharge Plan: SNF    Current Diagnoses: Patient Active Problem List   Diagnosis Date Noted   Incarcerated hernia 02/21/2024   Essential hypertension 02/21/2024   Anemia 02/21/2024   SBO (small bowel obstruction) (HCC) 02/21/2024   Leg edema, right 02/21/2024   Metastatic castration-resistant adenocarcinoma of prostate (HCC) 05/12/2022   Memory disorder 11/28/2014   Tremor 11/28/2014    Orientation RESPIRATION BLADDER Height & Weight     Self, Time, Situation, Place  Normal Continent Weight: 111 lb 5.3 oz (50.5 kg) Height:  5\' 1"  (154.9 cm)  BEHAVIORAL SYMPTOMS/MOOD NEUROLOGICAL BOWEL NUTRITION STATUS      Continent Diet (soft diet)  AMBULATORY STATUS COMMUNICATION OF NEEDS Skin   Limited Assist Verbally Normal                       Personal Care Assistance Level of Assistance  Bathing, Feeding, Dressing Bathing Assistance: Limited assistance Feeding assistance: Limited assistance Dressing Assistance: Limited assistance     Functional Limitations Info  Speech, Hearing, Sight Sight Info: Impaired Hearing Info: Impaired Speech Info: Adequate    SPECIAL CARE FACTORS FREQUENCY  PT (By licensed PT), OT (By licensed OT)     PT Frequency: 5x/wk OT Frequency: 5x/wk            Contractures Contractures Info: Not  present    Additional Factors Info  Code Status, Allergies Code Status Info: DNR Allergies Info: No known allergies           Current Medications (02/22/2024):  This is the current hospital active medication list Current Facility-Administered Medications  Medication Dose Route Frequency Provider Last Rate Last Admin   acetaminophen (TYLENOL) tablet 650 mg  650 mg Oral Q6H PRN Kinsinger, Alphonso Aschoff, MD       Or   acetaminophen (TYLENOL) suppository 650 mg  650 mg Rectal Q6H PRN Kinsinger, Alphonso Aschoff, MD       amLODipine (NORVASC) tablet 5 mg  5 mg Oral Daily Samtani, Jai-Gurmukh, MD   5 mg at 02/22/24 6295   artificial tears ophthalmic solution 1 drop  1 drop Both Eyes BID Samtani, Jai-Gurmukh, MD   1 drop at 02/22/24 0929   escitalopram (LEXAPRO) tablet 10 mg  10 mg Oral Daily Samtani, Jai-Gurmukh, MD   10 mg at 02/22/24 2841   feeding supplement (ENSURE PLUS HIGH PROTEIN) liquid 237 mL  237 mL Oral BID BM Samtani, Jai-Gurmukh, MD   237 mL at 02/22/24 1430   fentaNYL  (SUBLIMAZE ) injection 12.5-50 mcg  12.5-50 mcg Intravenous Q2H PRN Kinsinger, Alphonso Aschoff, MD       labetalol (NORMODYNE) injection 10 mg  10 mg Intravenous Q2H PRN Kinsinger, Alphonso Aschoff, MD       metoprolol succinate (TOPROL-XL) 24 hr tablet 100 mg  100 mg Oral Daily Samtani, Jai-Gurmukh, MD   100 mg  at 02/22/24 0926   mirabegron ER (MYRBETRIQ) tablet 25 mg  25 mg Oral Daily Samtani, Jai-Gurmukh, MD   25 mg at 02/22/24 0927   ondansetron (ZOFRAN) tablet 4 mg  4 mg Oral Q6H PRN Kinsinger, Alphonso Aschoff, MD       Or   ondansetron (ZOFRAN) injection 4 mg  4 mg Intravenous Q6H PRN Kinsinger, Alphonso Aschoff, MD       oxyCODONE (Oxy IR/ROXICODONE) immediate release tablet 5 mg  5 mg Oral Q6H PRN Kinsinger, Alphonso Aschoff, MD       polyethylene glycol (MIRALAX / GLYCOLAX) packet 17 g  17 g Oral Daily Marlin Simmonds, PA-C   17 g at 02/22/24 1021   traMADol (ULTRAM) tablet 50 mg  50 mg Oral Q6H PRN Kinsinger, Alphonso Aschoff, MD          Discharge Medications: Please see discharge summary for a list of discharge medications.  Relevant Imaging Results:  Relevant Lab Results:   Additional Information SSN [id number]  Amaryllis Junior, LCSW

## 2024-02-22 NOTE — Plan of Care (Signed)
  Problem: Pain Managment: Goal: General experience of comfort will improve and/or be controlled Outcome: Progressing   Problem: Clinical Measurements: Goal: Diagnostic test results will improve Outcome: Progressing   Problem: Activity: Goal: Risk for activity intolerance will decrease Outcome: Progressing   Problem: Safety: Goal: Ability to remain free from injury will improve Outcome: Progressing

## 2024-02-22 NOTE — Evaluation (Signed)
 Physical Therapy Evaluation Patient Details Name: Todd Holmes MRN: [id number] DOB: [birth year] Today's Date: 02/22/2024  History of Present Illness  Todd Holmes is a 89 y.o. male admitted 02/20/24 with incarcerated hernia with medical history significant of HTN, prostate cancer, anemia  Clinical Impression  Pt admitted with above diagnosis. Pt in recliner with cousin present in the room. Pt agreeable to session, states that he has assistance with cooking and cleaning at home, but lives alone. Pt able to come to stand with two wheeled walker at CGA-SBA, progresses mobility to amb x55ft in the hallway. Pt unable to progress to stair training this day, requires assistance with mobility away from bed/sitting surface to maintain safety and prevent falls. Based on pt PLOF, level of support, and current functional status, he would benefit from skilled PT <3 hours per day, 5 times per week at dc. Pt currently with functional limitations due to the deficits listed below (see PT Problem List). Pt will benefit from acute skilled PT to increase their independence and safety with mobility to allow discharge.           If plan is discharge home, recommend the following: A little help with walking and/or transfers;A little help with bathing/dressing/bathroom;Assistance with cooking/housework;Assist for transportation;Help with stairs or ramp for entrance   Can travel by private vehicle   Yes    Equipment Recommendations None recommended by PT (to be assessed at next venue of care)  Recommendations for Other Services       Functional Status Assessment Patient has had a recent decline in their functional status and demonstrates the ability to make significant improvements in function in a reasonable and predictable amount of time.     Precautions / Restrictions Precautions Precautions: Fall Recall of Precautions/Restrictions: Intact Restrictions Weight Bearing Restrictions Per Provider Order: No       Mobility  Bed Mobility               General bed mobility comments: pt in recliner when PT arrives, returns to recliner    Transfers Overall transfer level: Needs assistance Equipment used: Rolling walker (2 wheels) Transfers: Sit to/from Stand Sit to Stand: Contact guard assist           General transfer comment: CGA-SBA for transfers with RW    Ambulation/Gait Ambulation/Gait assistance: Contact guard assist Gait Distance (Feet): 40 Feet Assistive device: Rolling walker (2 wheels) Gait Pattern/deviations: Step-through pattern, Decreased stride length, Wide base of support Gait velocity: dec     General Gait Details: Pt able to amb x74ft with RW and CGA, has decreased endurance and short stride length bilaterally. He returns to recliner following amb. Educated on AD use and current level of function to maintain safety. Slow, wide directional changes  Stairs Stairs:  (do not progress stairs this day due to safety and endurance deficits)          Wheelchair Mobility     Tilt Bed    Modified Rankin (Stroke Patients Only)       Balance Overall balance assessment: Needs assistance Sitting-balance support: Feet supported, No upper extremity supported Sitting balance-Leahy Scale: Fair     Standing balance support: During functional activity, No upper extremity supported Standing balance-Leahy Scale: Fair Standing balance comment: able to stand and don 2nd gown, removing hands from walker briefly                             Pertinent  Vitals/Pain Pain Assessment Pain Assessment: 0-10 Pain Score: 3  Pain Location: abdominal pain Pain Descriptors / Indicators: Aching, Constant, Cramping Pain Intervention(s): Limited activity within patient's tolerance, Monitored during session    Home Living Family/patient expects to be discharged to:: Private residence Living Arrangements: Alone Available Help at Discharge: Family;Available  PRN/intermittently Type of Home: House Home Access: Stairs to enter Entrance Stairs-Rails: Doctor, general practice of Steps: 6   Home Layout: One level Home Equipment: Cane - single point;Grab bars - tub/shower;Shower seat      Prior Function Prior Level of Function : Independent/Modified Independent                     Extremity/Trunk Assessment   Upper Extremity Assessment Upper Extremity Assessment: Defer to OT evaluation    Lower Extremity Assessment Lower Extremity Assessment: Generalized weakness    Cervical / Trunk Assessment Cervical / Trunk Assessment: Kyphotic  Communication   Communication Communication: No apparent difficulties    Cognition Arousal: Alert Behavior During Therapy: WFL for tasks assessed/performed   PT - Cognitive impairments: No apparent impairments                         Following commands: Intact       Cueing Cueing Techniques: Verbal cues, Gestural cues     General Comments      Exercises     Assessment/Plan    PT Assessment Patient needs continued PT services  PT Problem List Decreased strength;Decreased activity tolerance;Decreased mobility;Decreased balance       PT Treatment Interventions DME instruction;Functional mobility training;Balance training;Patient/family education;Gait training;Therapeutic activities;Stair training;Therapeutic exercise    PT Goals (Current goals can be found in the Care Plan section)  Acute Rehab PT Goals Patient Stated Goal: improve strength and endurance to return home PT Goal Formulation: With patient Time For Goal Achievement: 03/07/24 Potential to Achieve Goals: Good    Frequency Min 3X/week     Co-evaluation               AM-PAC PT "6 Clicks" Mobility  Outcome Measure Help needed turning from your back to your side while in a flat bed without using bedrails?: A Little Help needed moving from lying on your back to sitting on the side of a flat  bed without using bedrails?: A Little Help needed moving to and from a bed to a chair (including a wheelchair)?: A Little Help needed standing up from a chair using your arms (e.g., wheelchair or bedside chair)?: A Little Help needed to walk in hospital room?: A Little Help needed climbing 3-5 steps with a railing? : A Lot 6 Click Score: 17    End of Session Equipment Utilized During Treatment: Gait belt Activity Tolerance: Patient limited by fatigue;Patient tolerated treatment well Patient left: in chair;with call bell/phone within reach;with chair alarm set;with family/visitor present Nurse Communication: Mobility status PT Visit Diagnosis: Muscle weakness (generalized) (M62.81);Difficulty in walking, not elsewhere classified (R26.2)    Time: 2725-3664 PT Time Calculation (min) (ACUTE ONLY): 28 min   Charges:   PT Evaluation $PT Eval Low Complexity: 1 Low PT Treatments $Gait Training: 8-22 mins PT General Charges $$ ACUTE PT VISIT: 1 Visit         Darien Eden, PT Acute Rehabilitation Services Office: [phone] 02/22/2024   Todd Holmes 02/22/2024, 11:23 AM

## 2024-02-22 NOTE — Progress Notes (Signed)
 [facility] ROUNDING NOTE TARAY NORMOYLE NFA:[id number]  DOB: 02-04-1929  DOA: 02/20/2024  PCP: Arva Lathe, MD  02/22/2024,10:44 AM  LOS: 1 day    Code Status: DNR   from: Home current Dispo: Unclear   89 year old male Metastatic prostate cancer with bony mets diagnosed 2023 on Eligard  Zytiga  with transition to abiraterone  secondary to disease progression Mild cognitive decline Developed acute bulge left groin 02/20/2024 some nausea no vomit Remote history of bilateral inguinal repairs 1980s Workup revealed sodium 141 BUN/creatinine 23/1.0 LFTs normal WBC 5.7 hemoglobin 10 platelet 103 CT ABD pelvis left inguinal hernia containing loop of bowel mild dilatation upstream measuring 2.8 cm?  Small bowel obstruction from incarcerated hernia-Clark level lymphadenopathy aortic bifurcation decreased in size Heterogeneous sclerosis in the pelvis and throughout the lumbar spine = metastatic disease General surgery consulted plan for nonemergent operative management  Plan  Incarcerated but not strangulated inguinal hernia S/p Lap repair Pain is moderate and controlled on tylenol 650, Oxy IR 5 q 6 prn, tramadol 60 q 6 prn and Fentanyl  iv 12.5-50 mcg for severe pain Grad to soft diet per gen surg  Metastatic prostate cancer 2023 diagnosis Outpatient follow-up with Dr. Rosaline Coma  Cognitive decline Relative at bedside tells me unable to manage ADLs unable to manage meds and house was in disarray May need short term rehab Would continue Lexapro 10 for behavioral issues  Hypertension Resume metoprolol 100 XL daily, amlodipine 5  Lower extremity swelling Ruling out DVT  Discussed with family at the bedside  DVT prophylaxis: SCD  Status is: Inpatient Remains inpatient appropriate because:    discussed with his and at the bedside-will require placement probably   Subjective:  Look fair sitting up tol some diet no n/v cp Rom intact  Objective + exam Vitals:   02/21/24 1430 02/21/24 2113  02/22/24 0123 02/22/24 0527  BP: 136/75 (!) 153/74 117/73 139/73  Pulse: (!) 59 (!) 59 65 (!) 57  Resp: 16 19 16 19   Temp: (!) 97.3 F (36.3 C) 98.5 F (36.9 C) 98.1 F (36.7 C) 98.9 F (37.2 C)  TempSrc: Oral Oral Oral Oral  SpO2: 99% 100% 99% 99%  Weight:      Height:       Filed Weights   02/21/24 0238 02/21/24 0618  Weight: 50.5 kg 50.5 kg    Examination:  Awake coherent no distress  looks younger than stated age S1 S2 no m/r/g Chest is clear no wheeze rales rhonchi Abdominal soft --slight tender in LLQ Coherent in nad  Data Reviewed: reviewed   CBC    Component Value Date/Time   WBC 5.7 02/22/2024 0452   RBC 2.83 (L) 02/22/2024 0452   HGB 8.8 (L) 02/22/2024 0452   HGB 9.9 (L) 02/02/2024 1503   HCT 27.1 (L) 02/22/2024 0452   PLT 89 (L) 02/22/2024 0452   PLT 94 (L) 02/02/2024 1503   MCV 95.8 02/22/2024 0452   MCH 31.1 02/22/2024 0452   MCHC 32.5 02/22/2024 0452   RDW 14.2 02/22/2024 0452   LYMPHSABS 0.4 (L) 02/22/2024 0452   MONOABS 0.7 02/22/2024 0452   EOSABS 0.0 02/22/2024 0452   BASOSABS 0.0 02/22/2024 0452      Latest Ref Rng & Units 02/22/2024    4:52 AM 02/21/2024    3:28 AM 02/20/2024    9:42 PM  CMP  Glucose 70 - 99 mg/dL 469  629  528   BUN 8 - 23 mg/dL 20  20  23    Creatinine 0.61 -  1.24 mg/dL 1.61  0.96  0.45   Sodium 135 - 145 mmol/L 135  138  141   Potassium 3.5 - 5.1 mmol/L 3.7  4.1  3.8   Chloride 98 - 111 mmol/L 105  103  106   CO2 22 - 32 mmol/L 22  22  21    Calcium 8.9 - 10.3 mg/dL 8.2  9.0  9.3   Total Protein 6.5 - 8.1 g/dL  7.3  7.6   Total Bilirubin 0.0 - 1.2 mg/dL  1.3  0.9   Alkaline Phos 38 - 126 U/L  58  62   AST 15 - 41 U/L  29  32   ALT 0 - 44 U/L  11  12     Scheduled Meds:  amLODipine  5 mg Oral Daily   artificial tears  1 drop Both Eyes BID   escitalopram  10 mg Oral Daily   metoprolol succinate  100 mg Oral Daily   mirabegron ER  25 mg Oral Daily   polyethylene glycol  17 g Oral Daily   Continuous  Infusions:  Time   20  Jai-Gurmukh Daron Breeding, MD  [facility]

## 2024-02-22 NOTE — Evaluation (Addendum)
 Occupational Therapy Evaluation Patient Details Name: Todd Holmes MRN: [id number] DOB: [birth year] Today's Date: 02/22/2024   History of Present Illness   Todd Holmes is a 89 y.o. male admitted 02/20/24 with incarcerated hernia with medical history significant of HTN, prostate cancer, anemia     Clinical Impressions Patient is a 89 year old male who was admitted for above. Patient was living at home alone at cane level with PRN support from family. Patient had posterior leaning with transitions with decreased ability to engage in Adls. Patient was noted to have decreased functional activity tolerance, decreased endurance, decreased standing balance, decreased safety awareness, and decreased knowledge of AD/AE impacting participation in ADLs. Patient will benefit from continued inpatient follow up therapy, <3 hours/day.     If plan is discharge home, recommend the following:   A lot of help with bathing/dressing/bathroom;Assistance with cooking/housework;Direct supervision/assist for medications management;Assist for transportation;Help with stairs or ramp for entrance;Direct supervision/assist for financial management;A little help with walking and/or transfers     Functional Status Assessment   Patient has had a recent decline in their functional status and demonstrates the ability to make significant improvements in function in a reasonable and predictable amount of time.     Equipment Recommendations   None recommended by OT      Precautions/Restrictions   Precautions Precautions: Fall Recall of Precautions/Restrictions: Intact Restrictions Weight Bearing Restrictions Per Provider Order: No     Mobility Bed Mobility Overal bed mobility: Needs Assistance Bed Mobility: Supine to Sit     Supine to sit: Min assist, HOB elevated, Used rails     General bed mobility comments: cues for log rolling               ADL either performed or assessed with  clinical judgement   ADL Overall ADL's : Needs assistance/impaired Eating/Feeding: Modified independent   Grooming: Sitting;Oral care;Wash/dry face;Set up   Upper Body Bathing: Sitting;Set up   Lower Body Bathing: Maximal assistance;Sit to/from stand   Upper Body Dressing : Sitting;Contact guard assist   Lower Body Dressing: Sit to/from stand;Maximal assistance Lower Body Dressing Details (indicate cue type and reason): posterior leaning in standing wearing adult absorbent undergarments with patient declining to have them tightened. Toilet Transfer: BSC/3in1;Ambulation;Moderate assistance (personal cane) Toilet Transfer Details (indicate cue type and reason): attempetd with cane with mod A needed to complete task. min A with RW Toileting- Clothing Manipulation and Hygiene: Sit to/from stand;Maximal assistance                Pertinent Vitals/Pain Pain Assessment Pain Assessment: 0-10 Pain Score: 3  Pain Location: abdominal pain Pain Descriptors / Indicators: Aching, Constant, Cramping     Extremity/Trunk Assessment Upper Extremity Assessment Upper Extremity Assessment: Defer to OT evaluation   Lower Extremity Assessment Lower Extremity Assessment: Generalized weakness   Cervical / Trunk Assessment Cervical / Trunk Assessment: Kyphotic   Communication Communication Communication: No apparent difficulties   Cognition Arousal: Alert Behavior During Therapy: WFL for tasks assessed/performed               OT - Cognition Comments: cousin was present in room with patient,.                                    Home Living Family/patient expects to be discharged to:: Private residence Living Arrangements: Alone Available Help at Discharge: Family;Available PRN/intermittently Type of Home: House Home  Access: Stairs to enter [employer] of Steps: 6 Entrance Stairs-Rails: Right;Left Home Layout: One level     Bathroom Shower/Tub:  Tub/shower unit;Walk-in shower         Home Equipment: Cane - single point;Grab bars - tub/shower;Shower seat          Prior Functioning/Environment Prior Level of Function : Independent/Modified Independent                    OT Problem List: Impaired balance (sitting and/or standing);Decreased knowledge of precautions;Decreased range of motion;Decreased safety awareness;Decreased activity tolerance;Decreased knowledge of use of DME or AE   OT Treatment/Interventions: Self-care/ADL training;DME and/or AE instruction;Therapeutic activities;Balance training;Energy conservation;Patient/family education      OT Goals(Current goals can be found in the care plan section)   Acute Rehab OT Goals Patient Stated Goal: to get better OT Goal Formulation: With patient/family Time For Goal Achievement: 03/07/24 Potential to Achieve Goals: Fair   OT Frequency:  Min 2X/week       AM-PAC OT "6 Clicks" Daily Activity     Outcome Measure Help from another person eating meals?: None Help from another person taking care of personal grooming?: A Little Help from another person toileting, which includes using toliet, bedpan, or urinal?: A Lot Help from another person bathing (including washing, rinsing, drying)?: A Lot Help from another person to put on and taking off regular upper body clothing?: A Little Help from another person to put on and taking off regular lower body clothing?: A Lot 6 Click Score: 16   End of Session Equipment Utilized During Treatment: Gait belt;Rolling walker (2 wheels) Nurse Communication: Mobility status  Activity Tolerance: Patient tolerated treatment well Patient left: in chair;with call bell/phone within reach;with chair alarm set  OT Visit Diagnosis: Unsteadiness on feet (R26.81);Other abnormalities of gait and mobility (R26.89)                Time: 1610-9604 OT Time Calculation (min): 45 min Charges:  OT General Charges $OT Visit: 1 Visit OT  Evaluation $OT Eval Low Complexity: 1 Low OT Treatments $Self Care/Home Management : 23-37 mins  Creedence Kunesh OTR/L, MS Acute Rehabilitation Department Office# [phone]   Jame Maze 02/22/2024, 1:11 PM

## 2024-02-22 NOTE — TOC Progression Note (Signed)
 Transition of Care [facility]) - Progression Note    Patient Details  Name: Todd Holmes MRN: [id number] Date of Birth: 1929/02/01  [facility] CM/SW Contact  Amaryllis Junior, LCSW Phone Number: 02/22/2024, 3:19 PM  Clinical Narrative:    Pt recommended for SNF. PASRR obtained and FL2 completed. SNF referral faxed out and awaiting bed offers.    Expected Discharge Plan: Home w Home Health Services Barriers to Discharge: Continued Medical Work up  Expected Discharge Plan and Services       Living arrangements for the past 2 months: Single Family Home                                       Social Determinants of Health (SDOH) Interventions SDOH Screenings   Food Insecurity: No Food Insecurity (02/21/2024)  Housing: Low Risk  (02/21/2024)  Transportation Needs: No Transportation Needs (02/21/2024)  Utilities: Not At Risk (02/21/2024)  Social Connections: Unknown (02/21/2024)  Tobacco Use: Low Risk  (02/21/2024)    Readmission Risk Interventions    02/21/2024    1:28 PM  Readmission Risk Prevention Plan  Post Dischage Appt Complete  Medication Screening Complete  Transportation Screening Complete

## 2024-02-22 NOTE — Progress Notes (Signed)
 1 Day Post-Op  Subjective: Patient doing relatively well today.  Just worked with OT to get up to the chair.  Very unsteady with his cane.  No nausea/vomiting.  Tolerating CLD.  + flatus.  No BM   Objective: Vital signs in last 24 hours: Temp:  [97.2 F (36.2 C)-98.9 F (37.2 C)] 98.9 F (37.2 C) (06/10 0527) Pulse Rate:  [51-65] 57 (06/10 0527) Resp:  [15-24] 19 (06/10 0527) BP: (115-153)/(62-77) 139/73 (06/10 0527) SpO2:  [97 %-100 %] 99 % (06/10 0527) Last BM Date : 02/20/24  Intake/Output from previous day: 06/09 0701 - 06/10 0700 In: 3398 [P.O.:240; I.V.:3108; IV Piggyback:50] Out: 1120 [Urine:1070; Blood:50] Intake/Output this shift: No intake/output data recorded.  PE: Abd: soft, appropriately tender, +BS, ND, incisions c/d/I, no recurrence of hernia noted in left inguinal region  Lab Results:  Recent Labs    02/21/24 0328 02/22/24 0452  WBC 5.4 5.7  HGB 10.4* 8.8*  HCT 32.8* 27.1*  PLT 100* 89*   BMET Recent Labs    02/21/24 0328 02/22/24 0452  NA 138 135  K 4.1 3.7  CL 103 105  CO2 22 22  GLUCOSE 134* 109*  BUN 20 20  CREATININE 0.87 0.92  CALCIUM 9.0 8.2*   PT/INR Recent Labs    02/21/24 0105  LABPROT 15.1  INR 1.2   CMP     Component Value Date/Time   NA 135 02/22/2024 0452   K 3.7 02/22/2024 0452   CL 105 02/22/2024 0452   CO2 22 02/22/2024 0452   GLUCOSE 109 (H) 02/22/2024 0452   BUN 20 02/22/2024 0452   CREATININE 0.92 02/22/2024 0452   CREATININE 1.12 02/02/2024 1503   CALCIUM 8.2 (L) 02/22/2024 0452   PROT 7.3 02/21/2024 0328   ALBUMIN 3.6 02/21/2024 0328   AST 29 02/21/2024 0328   AST 21 02/02/2024 1503   ALT 11 02/21/2024 0328   ALT 7 02/02/2024 1503   ALKPHOS 58 02/21/2024 0328   BILITOT 1.3 (H) 02/21/2024 0328   BILITOT 0.6 02/02/2024 1503   GFRNONAA >60 02/22/2024 0452   GFRNONAA >60 02/02/2024 1503   GFRAA 90 (L) 09/11/2011 1359   Lipase     Component Value Date/Time   LIPASE 21 02/20/2024 2142        Studies/Results: VAS US  LOWER EXTREMITY VENOUS (DVT) Result Date: 02/21/2024  Lower Venous DVT Study Patient Name:  ALEXZAVIER GIRARDIN  Date of Exam:   02/21/2024 Medical Rec #: [id number]         Accession #:    [id number] Date of Birth: 25-Jul-1929         Patient Gender: M Patient Age:   89 years Exam Location:  [facility] Procedure:      VAS US  LOWER EXTREMITY VENOUS (DVT) Referring Phys: Mardy Shall DOUTOVA --------------------------------------------------------------------------------  Indications: Edema.  Risk Factors: None identified. Limitations: Poor ultrasound/tissue interface. Comparison Study: No prior studies. Performing Technologist: Lerry Ransom RVT  Examination Guidelines: A complete evaluation includes B-mode imaging, spectral Doppler, color Doppler, and power Doppler as needed of all accessible portions of each vessel. Bilateral testing is considered an integral part of a complete examination. Limited examinations for reoccurring indications may be performed as noted. The reflux portion of the exam is performed with the patient in reverse Trendelenburg.  +---------+---------------+---------+-----------+----------+--------------+ RIGHT    CompressibilityPhasicitySpontaneityPropertiesThrombus Aging +---------+---------------+---------+-----------+----------+--------------+ CFV      Full           Yes      Yes                                 +---------+---------------+---------+-----------+----------+--------------+  SFJ      Full                                                        +---------+---------------+---------+-----------+----------+--------------+ FV Prox  Full                                                        +---------+---------------+---------+-----------+----------+--------------+ FV Mid   Full                                                        +---------+---------------+---------+-----------+----------+--------------+ FV  DistalFull                                                        +---------+---------------+---------+-----------+----------+--------------+ PFV      Full                                                        +---------+---------------+---------+-----------+----------+--------------+ POP      Full           Yes      Yes                                 +---------+---------------+---------+-----------+----------+--------------+ PTV      Full                                                        +---------+---------------+---------+-----------+----------+--------------+ PERO     Full                                                        +---------+---------------+---------+-----------+----------+--------------+   +----+---------------+---------+-----------+----------+--------------+ LEFTCompressibilityPhasicitySpontaneityPropertiesThrombus Aging +----+---------------+---------+-----------+----------+--------------+ CFV Full           Yes      Yes                                 +----+---------------+---------+-----------+----------+--------------+    Summary: RIGHT: - There is no evidence of deep vein thrombosis in the lower extremity.  - No cystic structure found in the popliteal fossa.  LEFT: - No evidence of common femoral vein obstruction.   *See table(s) above for measurements and observations. Electronically signed by Irvin Mantel on 02/21/2024  at 4:52:01 PM.    Final    DG CHEST PORT 1 VIEW Result Date: 02/21/2024 CLINICAL DATA:  Preoperative examination. EXAM: PORTABLE CHEST 1 VIEW COMPARISON:  09/11/2011 FINDINGS: Cardiac shadow is stable. Tortuous thoracic aorta is again seen. The lungs are clear. No bony abnormality is noted. IMPRESSION: No acute abnormality noted. Electronically Signed   By: Violeta Grey M.D.   On: 02/21/2024 01:42   CT ABDOMEN PELVIS W CONTRAST Result Date: 02/20/2024 CLINICAL DATA:  Left lower quadrant abdominal pain EXAM: CT ABDOMEN  AND PELVIS WITH CONTRAST TECHNIQUE: Multidetector CT imaging of the abdomen and pelvis was performed using the standard protocol following bolus administration of intravenous contrast. RADIATION DOSE REDUCTION: This exam was performed according to the departmental dose-optimization program which includes automated exposure control, adjustment of the mA and/or kV according to patient size and/or use of iterative reconstruction technique. CONTRAST:  OMNIPAQUE  IOHEXOL  300 MG/ML  SOLN COMPARISON:  CT abdomen pelvis 10/15/2021 and PET/CT 03/01/2023 FINDINGS: Lower chest: No acute abnormality. Hepatobiliary: Heterogenous perfusion in the liver. No acute abnormality. Unremarkable gallbladder and biliary tree. Pancreas: Atrophic. Coarse calcifications. Prominent pancreatic duct measuring 5 mm. No acute abnormality. Spleen: Unremarkable. Adrenals/Urinary Tract: Unremarkable adrenal glands. No urinary calculi or hydronephrosis. Unremarkable bladder. Stomach/Bowel: Stomach is within normal limits. Normal caliber large and small bowel. Extensive colonic diverticulosis without evidence of diverticulitis. Herniation of a loop of small bowel into the left inguinal hernia. There is mild dilation of the small bowel upstream from the herniated bowel measuring 2.8 cm. There is mild wall thickening and hyperenhancement of the small bowel in the hernia with adjacent fluid and trace stranding. Vascular/Lymphatic: Aortic atherosclerotic calcification. Confluent lymphadenopathy at the aortic bifurcation measures 2.1 x 4.6 cm this has decreased in size from PET/CT 03/01/2023 when it measured 3.1 x 5.3 cm using similar to measuring technique. Reproductive: Fiducial markers about the prostate. Other: No free intraperitoneal air. Musculoskeletal: No acute fracture. Increased heterogenous sclerosis in the pelvis and visualized thoracolumbar spine compatible with metastatic disease. IMPRESSION: 1. Left inguinal hernia containing a loop of  small bowel. There is mild dilation of the small bowel upstream from the herniated bowel measuring 2.8 cm. There is mild wall thickening and hyperenhancement of the small bowel in the hernia with adjacent fluid and trace stranding. Findings are concerning for early or partial small bowel obstruction from incarcerated hernia. Surgical consult is recommended. 2. Confluent lymphadenopathy at the aortic bifurcation has decreased in size from PET/CT 03/01/2023. 3. Increased heterogenous sclerosis in the pelvis and visualized thoracolumbar spine compatible with metastatic disease plus or minus post treatment change. 4. Sequela of chronic pancreatitis. 5. Aortic Atherosclerosis (ICD10-I70.0). Electronically Signed   By: Rozell Cornet M.D.   On: 02/20/2024 23:27    Anti-infectives: Anti-infectives (From admission, onward)    Start     Dose/Rate Route Frequency Ordered Stop   02/21/24 0800  piperacillin-tazobactam (ZOSYN) IVPB 3.375 g  Status:  Discontinued        3.375 g 12.5 mL/hr over 240 Minutes Intravenous Every 8 hours 02/21/24 0118 02/21/24 1228   02/21/24 0130  piperacillin-tazobactam (ZOSYN) IVPB 3.375 g        3.375 g 100 mL/hr over 30 Minutes Intravenous  Once 02/21/24 0118 02/21/24 0234        Assessment/Plan POD 1, s/p lap left recurrent incarcerated inguinal hernia repair with mesh, Dr. Dorrie Gaudier 6/9 -doing well -adv to soft diet -OT has seen the patient and rec SNF due to lack of support  at home and unsteady gait.  Patient agreeable to this.  D/w cousin in the room and primary service. -miralax to help with BM  FEN - soft VTE - ok for chemical prophylaxis from our standpoint ID - none currently needed   LOS: 1 day    Leone Ralphs , [facility] 02/22/2024, 9:33 AM Please see Amion for pager number during day hours 7:00am-4:30pm or 7:00am -11:30am on weekends

## 2024-02-22 NOTE — Progress Notes (Signed)
 Initial Nutrition Assessment  DOCUMENTATION CODES:   Severe malnutrition in context of chronic illness  INTERVENTION:   -Ensure Plus High Protein po BID, each supplement provides 350 kcal and 20 grams of protein.   NUTRITION DIAGNOSIS:   Severe Malnutrition related to chronic illness as evidenced by moderate fat depletion, severe muscle depletion, percent weight loss.  GOAL:   Patient will meet greater than or equal to 90% of their needs  MONITOR:   PO intake, Supplement acceptance  REASON FOR ASSESSMENT:   Consult Assessment of nutrition requirement/status  ASSESSMENT:   89 y.o. patient with history of hypertension, metastatic prostate cancer.  He underwent radiation for his prostate cancer and most recently underwent Pluvicto  treatment with the last 1 on April 3. Admitted for N/V and acute bulge left groin.  6/9: s/p hernia repair  Patient in room, cousin at bedside to assist with history. Pt reports feeling hungry today. States he was tolerating clear liquids so far.  At home, typically consumes 2 meals. Sleeps in late so doesn't have "breakfast" anywhere between 11 and 3 pm then dinner is around 6-7 pm. For the first meal he may eat oatmeal or a sandwich. Dinner is usually provided by family and this is a meat and some sides. He has Ensure at home but doesn't drink them consistently. Encouraged pt to consume daily. Willing to drink Ensure here if they are vanilla flavor.   Per weight records, pt has lost 11 lbs since 2/12 (9% wt loss x 4 months, significant for time frame).  Medications: Miralax  Labs reviewed.  NUTRITION - FOCUSED PHYSICAL EXAM:  Flowsheet Row Most Recent Value  Orbital Region Moderate depletion  Upper Arm Region Severe depletion  Thoracic and Lumbar Region Unable to assess  Buccal Region Moderate depletion  Temple Region Moderate depletion  Clavicle Bone Region Severe depletion  Clavicle and Acromion Bone Region Severe depletion  Scapular  Bone Region Severe depletion  Dorsal Hand Moderate depletion  Patellar Region Unable to assess  Anterior Thigh Region Unable to assess  Posterior Calf Region Unable to assess  Edema (RD Assessment) None  Hair Reviewed  Eyes Reviewed  Mouth Reviewed  Skin Reviewed  Nails Reviewed       Diet Order:   Diet Order             DIET SOFT Room service appropriate? Yes; Fluid consistency: Thin  Diet effective now                   EDUCATION NEEDS:   Not appropriate for education at this time  Skin:  Skin Assessment: Reviewed RN Assessment  Last BM:  6/8  Height:   Ht Readings from Last 1 Encounters:  02/21/24 5\' 1"  (1.549 m)    Weight:   Wt Readings from Last 1 Encounters:  02/21/24 50.5 kg    BMI:  Body mass index is 21.04 kg/m.  Estimated Nutritional Needs:   Kcal:  1300-1500  Protein:  60-70g  Fluid:  1.5L/day   Arna Better, MS, RD, LDN Inpatient Clinical Dietitian Contact via Secure chat

## 2024-02-22 NOTE — Discharge Instructions (Signed)

## 2024-02-23 DIAGNOSIS — K460 Unspecified abdominal hernia with obstruction, without gangrene: Secondary | ICD-10-CM | POA: Diagnosis not present

## 2024-02-23 LAB — CBC WITH DIFFERENTIAL/PLATELET
Abs Immature Granulocytes: 0.02 10*3/uL (ref 0.00–0.07)
Basophils Absolute: 0 10*3/uL (ref 0.0–0.1)
Basophils Relative: 0 %
Eosinophils Absolute: 0 10*3/uL (ref 0.0–0.5)
Eosinophils Relative: 1 %
HCT: 25.2 % — ABNORMAL LOW (ref 39.0–52.0)
Hemoglobin: 8.1 g/dL — ABNORMAL LOW (ref 13.0–17.0)
Immature Granulocytes: 0 %
Lymphocytes Relative: 10 %
Lymphs Abs: 0.5 10*3/uL — ABNORMAL LOW (ref 0.7–4.0)
MCH: 30.7 pg (ref 26.0–34.0)
MCHC: 32.1 g/dL (ref 30.0–36.0)
MCV: 95.5 fL (ref 80.0–100.0)
Monocytes Absolute: 0.6 10*3/uL (ref 0.1–1.0)
Monocytes Relative: 13 %
Neutro Abs: 3.6 10*3/uL (ref 1.7–7.7)
Neutrophils Relative %: 76 %
Platelets: 81 10*3/uL — ABNORMAL LOW (ref 150–400)
RBC: 2.64 MIL/uL — ABNORMAL LOW (ref 4.22–5.81)
RDW: 14.1 % (ref 11.5–15.5)
WBC: 4.7 10*3/uL (ref 4.0–10.5)
nRBC: 0 % (ref 0.0–0.2)

## 2024-02-23 LAB — BASIC METABOLIC PANEL WITH GFR
Anion gap: 7 (ref 5–15)
BUN: 30 mg/dL — ABNORMAL HIGH (ref 8–23)
CO2: 23 mmol/L (ref 22–32)
Calcium: 8 mg/dL — ABNORMAL LOW (ref 8.9–10.3)
Chloride: 106 mmol/L (ref 98–111)
Creatinine, Ser: 1.14 mg/dL (ref 0.61–1.24)
Glucose, Bld: 96 mg/dL (ref 70–99)
Potassium: 3.5 mmol/L (ref 3.5–5.1)
Sodium: 136 mmol/L (ref 135–145)

## 2024-02-23 MED ORDER — ENOXAPARIN SODIUM 40 MG/0.4ML IJ SOSY: 40.0000 mg | PREFILLED_SYRINGE | INTRAMUSCULAR | Status: DC

## 2024-02-23 NOTE — Progress Notes (Signed)
 Mobility Specialist - Progress Note   02/23/24 0925  Mobility  Activity Ambulated with assistance in hallway  Level of Assistance Standby assist, set-up cues, supervision of patient - no hands on  Assistive Device Front wheel walker  Distance Ambulated (ft) 250 ft  Activity Response Tolerated well  Mobility Referral Yes  Mobility visit 1 Mobility  Mobility Specialist Start Time (ACUTE ONLY) 0913  Mobility Specialist Stop Time (ACUTE ONLY) 0925  Mobility Specialist Time Calculation (min) (ACUTE ONLY) 12 min   Pt received in bed and agreeable to mobility. MinA from supine>sitting. No complaints during session. MinA getting back into bed. Pt to bed after session with all needs met.    [facility]

## 2024-02-23 NOTE — Progress Notes (Addendum)
 2 Days Post-Op  Subjective: Continues to do well.  Tolerating his diet and still with some bowel function (flatus, no BM)   Objective: Vital signs in last 24 hours: Temp:  [97.8 F (36.6 C)-98.5 F (36.9 C)] 98.5 F (36.9 C) (06/11 0524) Pulse Rate:  [57-62] 57 (06/11 0524) Resp:  [16-17] 17 (06/11 0524) BP: (111-149)/(59-74) 111/59 (06/11 0524) SpO2:  [98 %-99 %] 98 % (06/11 0524) Last BM Date : 02/20/24  Intake/Output from previous day: 06/10 0701 - 06/11 0700 In: 870 [P.O.:870] Out: 0  Intake/Output this shift: No intake/output data recorded.  PE: Abd: soft, appropriately tender, +BS, ND, incisions c/d/I, no recurrence of hernia noted in left inguinal region  Lab Results:  Recent Labs    02/22/24 0452 02/23/24 0458  WBC 5.7 4.7  HGB 8.8* 8.1*  HCT 27.1* 25.2*  PLT 89* 81*   BMET Recent Labs    02/22/24 0452 02/23/24 0458  NA 135 136  K 3.7 3.5  CL 105 106  CO2 22 23  GLUCOSE 109* 96  BUN 20 30*  CREATININE 0.92 <0.30*  CALCIUM 8.2* 8.0*   PT/INR Recent Labs    02/21/24 0105  LABPROT 15.1  INR 1.2   CMP     Component Value Date/Time   NA 136 02/23/2024 0458   K 3.5 02/23/2024 0458   CL 106 02/23/2024 0458   CO2 23 02/23/2024 0458   GLUCOSE 96 02/23/2024 0458   BUN 30 (H) 02/23/2024 0458   CREATININE <0.30 (L) 02/23/2024 0458   CREATININE 1.12 02/02/2024 1503   CALCIUM 8.0 (L) 02/23/2024 0458   PROT 7.3 02/21/2024 0328   ALBUMIN 3.6 02/21/2024 0328   AST 29 02/21/2024 0328   AST 21 02/02/2024 1503   ALT 11 02/21/2024 0328   ALT 7 02/02/2024 1503   ALKPHOS 58 02/21/2024 0328   BILITOT 1.3 (H) 02/21/2024 0328   BILITOT 0.6 02/02/2024 1503   GFRNONAA NOT CALCULATED 02/23/2024 0458   GFRNONAA >60 02/02/2024 1503   GFRAA 90 (L) 09/11/2011 1359   Lipase     Component Value Date/Time   LIPASE 21 02/20/2024 2142       Studies/Results: VAS US  LOWER EXTREMITY VENOUS (DVT) Result Date: 02/21/2024  Lower Venous DVT Study Patient  Name:  ERIEL DOYON  Date of Exam:   02/21/2024 Medical Rec #: [id number]         Accession #:    [id number] Date of Birth: [birth year]         Patient Gender: M Patient Age:   89 years Exam Location:  [facility] Procedure:      VAS US  LOWER EXTREMITY VENOUS (DVT) Referring Phys: Mardy Shall DOUTOVA --------------------------------------------------------------------------------  Indications: Edema.  Risk Factors: None identified. Limitations: Poor ultrasound/tissue interface. Comparison Study: No prior studies. Performing Technologist: Lerry Ransom RVT  Examination Guidelines: A complete evaluation includes B-mode imaging, spectral Doppler, color Doppler, and power Doppler as needed of all accessible portions of each vessel. Bilateral testing is considered an integral part of a complete examination. Limited examinations for reoccurring indications may be performed as noted. The reflux portion of the exam is performed with the patient in reverse Trendelenburg.  +---------+---------------+---------+-----------+----------+--------------+ RIGHT    CompressibilityPhasicitySpontaneityPropertiesThrombus Aging +---------+---------------+---------+-----------+----------+--------------+ CFV      Full           Yes      Yes                                 +---------+---------------+---------+-----------+----------+--------------+  SFJ      Full                                                        +---------+---------------+---------+-----------+----------+--------------+ FV Prox  Full                                                        +---------+---------------+---------+-----------+----------+--------------+ FV Mid   Full                                                        +---------+---------------+---------+-----------+----------+--------------+ FV DistalFull                                                         +---------+---------------+---------+-----------+----------+--------------+ PFV      Full                                                        +---------+---------------+---------+-----------+----------+--------------+ POP      Full           Yes      Yes                                 +---------+---------------+---------+-----------+----------+--------------+ PTV      Full                                                        +---------+---------------+---------+-----------+----------+--------------+ PERO     Full                                                        +---------+---------------+---------+-----------+----------+--------------+   +----+---------------+---------+-----------+----------+--------------+ LEFTCompressibilityPhasicitySpontaneityPropertiesThrombus Aging +----+---------------+---------+-----------+----------+--------------+ CFV Full           Yes      Yes                                 +----+---------------+---------+-----------+----------+--------------+    Summary: RIGHT: - There is no evidence of deep vein thrombosis in the lower extremity.  - No cystic structure found in the popliteal fossa.  LEFT: - No evidence of common femoral vein obstruction.   *See table(s) above for measurements and observations. Electronically signed by Irvin Mantel on 02/21/2024  at 4:52:01 PM.    Final     Anti-infectives: Anti-infectives (From admission, onward)    Start     Dose/Rate Route Frequency Ordered Stop   02/21/24 0800  piperacillin-tazobactam (ZOSYN) IVPB 3.375 g  Status:  Discontinued        3.375 g 12.5 mL/hr over 240 Minutes Intravenous Every 8 hours 02/21/24 0118 02/21/24 1228   02/21/24 0130  piperacillin-tazobactam (ZOSYN) IVPB 3.375 g        3.375 g 100 mL/hr over 30 Minutes Intravenous  Once 02/21/24 0118 02/21/24 0234        Assessment/Plan POD 2, s/p lap left recurrent incarcerated inguinal hernia repair with mesh, Dr.  Dorrie Holmes 6/9 -doing well -tolerating soft diet -SNF placement in process -miralax to help with BM -surgically stable for DC when dispo arranged. -follow up obtained already and in AVS  FEN - soft VTE - hold prophylaxis due to thrombocytopenia ID - none currently needed   LOS: 2 days    Leone Ralphs , [facility] 02/23/2024, 8:34 AM Please see Amion for pager number during day hours 7:00am-4:30pm or 7:00am -11:30am on weekends

## 2024-02-23 NOTE — Progress Notes (Signed)
 PROGRESS NOTE    Todd Holmes  ZOX:[id number] DOB: July 01, 1929 DOA: 02/20/2024 PCP: Arva Lathe, MD   Brief Narrative:  89 year old male with history of hypertension, prostate cancer, anemia who was admitted for left recurrent incarcerated inguinal hernia.  He underwent hernia repair with mesh by general surgery on 02/21/2024.  PT recommending SNF placement.  TOC consulted.  Assessment & Plan:   Left recurrent incarcerated inguinal hernia -Status post  hernia repair with mesh by general surgery on 02/21/2024. - General Surgery following.  Tolerating soft diet.  Pain management and wound care as per general surgery recommendations.  Metastatic prostate cancer - Outpatient follow-up with oncology  Memory impairment/cognitive decline - Continue Lexapro for behavioral issues.  Outpatient follow-up with PCP/neurology  Hypertension--continue amlodipine and metoprolol  Lower extremity swelling - Duplex ultrasound was negative for DVT  Thrombocytopenia - Questionable cause.  No signs of bleeding.  Monitor  Anemia of chronic disease - From chronic illnesses.  Hemoglobin stable.  Monitor intermittently  Acute metabolic acidosis - Improved  DVT prophylaxis: SCDs.  Avoiding Lovenox because of thrombocytopenia Code Status: DNR Family Communication: Niece at bedside Disposition Plan: Status is: Inpatient Remains inpatient appropriate because: Of severity of illness.  Need for SNF placement    Consultants: General Surgery  Procedures: As above  Antimicrobials: Perioperative   Subjective: Patient seen and examined at bedside.  Feels better.  Tolerating diet.  No fever, vomiting, agitation reported.  Objective: Vitals:   02/22/24 1328 02/22/24 2216 02/23/24 0524 02/23/24 0843  BP: (!) 149/74 120/67 (!) 111/59 123/66  Pulse: 62 60 (!) 57 65  Resp: 17 16 17    Temp: 97.8 F (36.6 C) 98.3 F (36.8 C) 98.5 F (36.9 C)   TempSrc: Oral Oral Oral   SpO2: 99% 98% 98%  97%  Weight:      Height:        Intake/Output Summary (Last 24 hours) at 02/23/2024 1137 Last data filed at 02/23/2024 0841 Gross per 24 hour  Intake 810 ml  Output 0 ml  Net 810 ml   Filed Weights   02/21/24 0238 02/21/24 0618  Weight: 50.5 kg 50.5 kg    Examination:  General exam: Appears calm and comfortable.  Elderly male lying in bed. Respiratory system: Bilateral decreased breath sounds at bases, no wheezing Cardiovascular system: S1 & S2 heard, Rate controlled Gastrointestinal system: Abdomen is nondistended, soft and nontender. Normal bowel sounds heard. Extremities: No cyanosis, clubbing, edema  Central nervous system: Alert.  Slow to respond.  Poor historian.  No focal neurological deficits. Moving extremities Skin: No rashes, lesions or ulcers Psychiatry: Flat affect.  Not agitated.     Data Reviewed: I have personally reviewed following labs and imaging studies  CBC: Recent Labs  Lab 02/20/24 2142 02/21/24 0328 02/22/24 0452 02/23/24 0458  WBC 5.7 5.4 5.7 4.7  NEUTROABS  --   --  4.6 3.6  HGB 10.3* 10.4* 8.8* 8.1*  HCT 33.6* 32.8* 27.1* 25.2*  MCV 98.5 97.0 95.8 95.5  PLT 104* 100* 89* 81*   Basic Metabolic Panel: Recent Labs  Lab 02/20/24 2142 02/21/24 0328 02/22/24 0452 02/23/24 0458  NA 141 138 135 136  K 3.8 4.1 3.7 3.5  CL 106 103 105 106  CO2 21* 22 22 23   GLUCOSE 171* 134* 109* 96  BUN 23 20 20  30*  CREATININE 1.08 0.87 0.92 <0.30*  CALCIUM 9.3 9.0 8.2* 8.0*  MG  --  2.0  --   --   PHOS  --  2.7  --   --    GFR: CrCl cannot be calculated (This lab value cannot be used to calculate CrCl because it is not a number: <0.30). Liver Function Tests: Recent Labs  Lab 02/20/24 2142 02/21/24 0328  AST 32 29  ALT 12 11  ALKPHOS 62 58  BILITOT 0.9 1.3*  PROT 7.6 7.3  ALBUMIN 3.9 3.6   Recent Labs  Lab 02/20/24 2142  LIPASE 21   No results for input(s): AMMONIA in the last 168 hours. Coagulation Profile: Recent Labs  Lab  02/21/24 0105  INR 1.2   Cardiac Enzymes: No results for input(s): CKTOTAL, CKMB, CKMBINDEX, TROPONINI in the last 168 hours. BNP (last 3 results) No results for input(s): PROBNP in the last 8760 hours. HbA1C: No results for input(s): HGBA1C in the last 72 hours. CBG: No results for input(s): GLUCAP in the last 168 hours. Lipid Profile: No results for input(s): CHOL, HDL, LDLCALC, TRIG, CHOLHDL, LDLDIRECT in the last 72 hours. Thyroid  Function Tests: No results for input(s): TSH, T4TOTAL, FREET4, T3FREE, THYROIDAB in the last 72 hours. Anemia Panel: No results for input(s): VITAMINB12, FOLATE, FERRITIN, TIBC, IRON, RETICCTPCT in the last 72 hours. Sepsis Labs: Recent Labs  Lab 02/21/24 0105 02/21/24 0328  LATICACIDVEN 1.7 1.3    Recent Results (from the past 240 hours)  Surgical pcr screen     Status: None   Collection Time: 02/21/24  5:22 AM   Specimen: Nasal Mucosa; Nasal Swab  Result Value Ref Range Status   MRSA, PCR NEGATIVE NEGATIVE Final   Staphylococcus aureus NEGATIVE NEGATIVE Final    Comment: (NOTE) The Xpert SA Assay (FDA approved for NASAL specimens in patients 56 years of age and older), is one component of a comprehensive surveillance program. It is not intended to diagnose infection nor to guide or monitor treatment. Performed at [facility], [address], [place], [place] [address]          Radiology Studies: VAS US  LOWER EXTREMITY VENOUS (DVT) Result Date: 02/21/2024  Lower Venous DVT Study Patient Name:  BRAEDON SJOGREN  Date of Exam:   02/21/2024 Medical Rec #: [id number]         Accession #:    [id number] Date of Birth: 09/24/1928         Patient Gender: M Patient Age:   59 years Exam Location:  [facility] Procedure:      VAS US  LOWER EXTREMITY VENOUS (DVT) Referring Phys: ANASTASSIA DOUTOVA --------------------------------------------------------------------------------   Indications: Edema.  Risk Factors: None identified. Limitations: Poor ultrasound/tissue interface. Comparison Study: No prior studies. Performing Technologist: Lerry Ransom RVT  Examination Guidelines: A complete evaluation includes B-mode imaging, spectral Doppler, color Doppler, and power Doppler as needed of all accessible portions of each vessel. Bilateral testing is considered an integral part of a complete examination. Limited examinations for reoccurring indications may be performed as noted. The reflux portion of the exam is performed with the patient in reverse Trendelenburg.  +---------+---------------+---------+-----------+----------+--------------+ RIGHT    CompressibilityPhasicitySpontaneityPropertiesThrombus Aging +---------+---------------+---------+-----------+----------+--------------+ CFV      Full           Yes      Yes                                 +---------+---------------+---------+-----------+----------+--------------+ SFJ      Full                                                        +---------+---------------+---------+-----------+----------+--------------+  FV Prox  Full                                                        +---------+---------------+---------+-----------+----------+--------------+ FV Mid   Full                                                        +---------+---------------+---------+-----------+----------+--------------+ FV DistalFull                                                        +---------+---------------+---------+-----------+----------+--------------+ PFV      Full                                                        +---------+---------------+---------+-----------+----------+--------------+ POP      Full           Yes      Yes                                 +---------+---------------+---------+-----------+----------+--------------+ PTV      Full                                                         +---------+---------------+---------+-----------+----------+--------------+ PERO     Full                                                        +---------+---------------+---------+-----------+----------+--------------+   +----+---------------+---------+-----------+----------+--------------+ LEFTCompressibilityPhasicitySpontaneityPropertiesThrombus Aging +----+---------------+---------+-----------+----------+--------------+ CFV Full           Yes      Yes                                 +----+---------------+---------+-----------+----------+--------------+    Summary: RIGHT: - There is no evidence of deep vein thrombosis in the lower extremity.  - No cystic structure found in the popliteal fossa.  LEFT: - No evidence of common femoral vein obstruction.   *See table(s) above for measurements and observations. Electronically signed by Irvin Mantel on 02/21/2024 at 4:52:01 PM.    Final         Scheduled Meds:  amLODipine  5 mg Oral Daily   artificial tears  1 drop Both Eyes BID   escitalopram  10 mg Oral Daily   feeding supplement  237 mL Oral BID BM   metoprolol succinate  100 mg Oral Daily  mirabegron ER  25 mg Oral Daily   polyethylene glycol  17 g Oral Daily   Continuous Infusions:        Audria Leather, MD [facility] 02/23/2024, 11:37 AM

## 2024-02-23 NOTE — TOC Progression Note (Signed)
 Transition of Care [facility]) - Progression Note    Patient Details  Name: Todd Holmes MRN: [id number] Date of Birth: [birth year]  [facility] CM/SW Contact  Amaryllis Junior, [place] Phone Number: 02/23/2024, 11:43 AM  Clinical Narrative:    CSW met with pt and cousin Ardelia Beau at bedside to review bed offers. Bed choice [facility]. CSW notified Darrian at [facility] of bed acceptance. Ins auth start; pending approval.    Expected Discharge Plan: Home w Home Health Services Barriers to Discharge: Continued Medical Work up  Expected Discharge Plan and Services       Living arrangements for the past 2 months: Single Family Home                                       Social Determinants of Health (SDOH) Interventions SDOH Screenings   Food Insecurity: No Food Insecurity (02/21/2024)  Housing: Low Risk  (02/21/2024)  Transportation Needs: No Transportation Needs (02/21/2024)  Utilities: Not At Risk (02/21/2024)  Social Connections: Unknown (02/21/2024)  Tobacco Use: Low Risk  (02/21/2024)    Readmission Risk Interventions    02/21/2024    1:28 PM  Readmission Risk Prevention Plan  Post Dischage Appt Complete  Medication Screening Complete  Transportation Screening Complete

## 2024-02-24 DIAGNOSIS — K460 Unspecified abdominal hernia with obstruction, without gangrene: Secondary | ICD-10-CM | POA: Diagnosis not present

## 2024-02-24 LAB — CBC WITH DIFFERENTIAL/PLATELET
Abs Immature Granulocytes: 0.02 10*3/uL (ref 0.00–0.07)
Basophils Absolute: 0 10*3/uL (ref 0.0–0.1)
Basophils Relative: 0 %
Eosinophils Absolute: 0.1 10*3/uL (ref 0.0–0.5)
Eosinophils Relative: 2 %
HCT: 23.4 % — ABNORMAL LOW (ref 39.0–52.0)
Hemoglobin: 7.6 g/dL — ABNORMAL LOW (ref 13.0–17.0)
Immature Granulocytes: 1 %
Lymphocytes Relative: 14 %
Lymphs Abs: 0.5 10*3/uL — ABNORMAL LOW (ref 0.7–4.0)
MCH: 30.9 pg (ref 26.0–34.0)
MCHC: 32.5 g/dL (ref 30.0–36.0)
MCV: 95.1 fL (ref 80.0–100.0)
Monocytes Absolute: 0.6 10*3/uL (ref 0.1–1.0)
Monocytes Relative: 15 %
Neutro Abs: 2.6 10*3/uL (ref 1.7–7.7)
Neutrophils Relative %: 68 %
Platelets: 79 10*3/uL — ABNORMAL LOW (ref 150–400)
RBC: 2.46 MIL/uL — ABNORMAL LOW (ref 4.22–5.81)
RDW: 14 % (ref 11.5–15.5)
WBC: 3.8 10*3/uL — ABNORMAL LOW (ref 4.0–10.5)
nRBC: 0 % (ref 0.0–0.2)

## 2024-02-24 LAB — BASIC METABOLIC PANEL WITH GFR
Anion gap: 6 (ref 5–15)
BUN: 29 mg/dL — ABNORMAL HIGH (ref 8–23)
CO2: 24 mmol/L (ref 22–32)
Calcium: 8.1 mg/dL — ABNORMAL LOW (ref 8.9–10.3)
Chloride: 109 mmol/L (ref 98–111)
Creatinine, Ser: 0.94 mg/dL (ref 0.61–1.24)
GFR, Estimated: >60 mL/min (ref >60)
Glucose, Bld: 94 mg/dL (ref 70–99)
Potassium: 3.5 mmol/L (ref 3.5–5.1)
Sodium: 139 mmol/L (ref 135–145)

## 2024-02-24 MED ORDER — TRAMADOL HCL 50 MG PO TABS: 50.0000 mg | ORAL_TABLET | Freq: Four times a day (QID) | ORAL | 0 refills | Status: DC | PRN

## 2024-02-24 MED ORDER — ONDANSETRON HCL 4 MG PO TABS: 4.0000 mg | ORAL_TABLET | Freq: Four times a day (QID) | ORAL | 0 refills | Status: DC | PRN

## 2024-02-24 MED ORDER — TRAMADOL HCL 50 MG PO TABS
50.0000 mg | ORAL_TABLET | Freq: Four times a day (QID) | ORAL | 0 refills | Status: DC | PRN
Start: 2024-02-24 — End: 2024-02-24

## 2024-02-24 MED ORDER — POLYETHYLENE GLYCOL 3350 17 G PO PACK: 17.0000 g | PACK | Freq: Every day | ORAL | 0 refills | Status: DC

## 2024-02-24 NOTE — TOC Transition Note (Signed)
 Transition of Care [facility]) - Discharge Note   Patient Details  Name: Todd Holmes MRN: [id number] Date of Birth: April 27, 1929  Transition of Care [facility]) CM/SW Contact:  Amaryllis Junior, LCSW Phone Number: 02/24/2024, 10:29 AM   Clinical Narrative:    Pt medically ready to dc to [facility] place room 904B. Call report [phone] given to RN. DC packet with DNR and signed scripts left at nurses station [facility] called at 10:18am. No further TOC needs.    Final next level of care: Skilled Nursing Facility Barriers to Discharge: Barriers Resolved   Patient Goals and CMS Choice Patient states their goals for this hospitalization and ongoing recovery are:: complet STR then home CMS Medicare.gov Compare Post Acute Care list provided to:: Patient Choice offered to / list presented to : Patient [place] ownership interest in [facility].provided to:: Patient    Discharge Placement PASRR number recieved: 02/22/24            Patient chooses bed at: [facility] Patient to be transferred to facility by: [facility] Name of family member notified: Lolly Riser [facility])  [phone] [facility]) Patient and family notified of of transfer: 02/24/24  Discharge Plan and Services Additional resources added to the After Visit Summary for                  DME Arranged: N/A DME Agency: NA       HH Arranged: NA HH Agency: NA        Social Drivers of Health (SDOH) Interventions SDOH Screenings   Food Insecurity: No Food Insecurity (02/21/2024)  Housing: Low Risk  (02/21/2024)  Transportation Needs: No Transportation Needs (02/21/2024)  Utilities: Not At Risk (02/21/2024)  Social Connections: Unknown (02/21/2024)  Tobacco Use: Low Risk  (02/21/2024)     Readmission Risk Interventions    02/24/2024    9:53 AM 02/21/2024    1:28 PM  Readmission Risk Prevention Plan  Post Dischage Appt  Complete  Medication Screening  Complete  Transportation Screening Complete Complete  PCP or Specialist  Appt within 5-7 Days Complete   Home Care Screening Complete   Medication Review (RN CM) Complete

## 2024-02-24 NOTE — Discharge Summary (Signed)
 Physician Discharge Summary  Todd Holmes ZOX:[id number] DOB: [birth year] DOA: 02/20/2024  PCP: Arva Lathe, MD  Admit date: 02/20/2024 Discharge date: 02/24/2024  Admitted From: Home Disposition: SNF  Recommendations for Outpatient Follow-up:  Follow up with SNF provider at earliest convenience Outpatient follow-up with general surgery.  Wound care as per general surgery Follow up in ED if symptoms worsen or new appear   Home Health: No Equipment/Devices: None  Discharge Condition: Stable CODE STATUS: DNR  diet recommendation: Heart healthy/soft diet as per general surgery  Brief/Interim Summary: 89 year old male with history of hypertension, prostate cancer, anemia who was admitted for left recurrent incarcerated inguinal hernia. He underwent hernia repair with mesh by general surgery on 02/21/2024. PT recommending SNF placement.  General surgery has cleared him for discharge.  He will be discharged to SNF once bed is available.  Discharge Diagnoses:   Left recurrent incarcerated inguinal hernia -Status post  hernia repair with mesh by general surgery on 02/21/2024. - General Surgery following.  Tolerating soft diet.  Pain management and wound care as per general surgery recommendations. -General surgery has cleared him for discharge.  He will be discharged to SNF once bed is available.  Outpatient follow-up with general surgery.   Metastatic prostate cancer - Outpatient follow-up with oncology   Memory impairment/cognitive decline - Continue Lexapro for behavioral issues.  Outpatient follow-up with PCP/neurology   Hypertension--continue metoprolol.  Hold amlodipine since blood pressure is intermittently on the lower side.   Lower extremity swelling - Duplex ultrasound was negative for DVT   Thrombocytopenia - Questionable cause.  No signs of bleeding.  Monitor intermittently as an outpatient   Anemia of chronic disease - From chronic illnesses.  Hemoglobin  stable.  Monitor intermittently as an outpatient   Acute metabolic acidosis - Improved   Discharge Instructions  Discharge Instructions     Diet - low sodium heart healthy   Complete by: As directed    Soft diet   Increase activity slowly   Complete by: As directed       Allergies as of 02/24/2024   No Known Allergies      Medication List     STOP taking these medications    amLODipine 5 MG tablet Commonly known as: NORVASC       TAKE these medications    escitalopram 10 MG tablet Commonly known as: LEXAPRO Take 10 mg by mouth daily.   metoprolol succinate 100 MG 24 hr tablet Commonly known as: TOPROL-XL Take 100 mg by mouth daily.   Myrbetriq 25 MG Tb24 tablet Generic drug: mirabegron ER Take 25 mg by mouth daily.   ondansetron 4 MG tablet Commonly known as: ZOFRAN Take 1 tablet (4 mg total) by mouth every 6 (six) hours as needed for nausea.   polyethylene glycol 17 g packet Commonly known as: MIRALAX / GLYCOLAX Take 17 g by mouth daily. Start taking on: February 25, 2024   traMADol 50 MG tablet Commonly known as: ULTRAM Take 1 tablet (50 mg total) by mouth every 6 (six) hours as needed for moderate pain (pain score 4-6) (mild pain).        Contact information for follow-up providers     Kinsinger, Alphonso Aschoff, MD Follow up on 03/15/2024.   Specialty: General Surgery Why: 10:50am, Arrive 30 minutes prior to your appointment time, Please bring your insurance card and photo ID Contact information: [address] [place] [address] [phone]  Contact information for after-discharge care     Destination     [facility] .   Service: Skilled Nursing Contact information: [phone] [place]   [phone]                    No Known Allergies  Consultations: General Surgery   Procedures/Studies: VAS US  LOWER EXTREMITY VENOUS  (DVT) Result Date: 02/21/2024  Lower Venous DVT Study Patient Name:  Todd Holmes  Date of Exam:   02/21/2024 Medical Rec #: [id number]         Accession #:    [id number] Date of Birth: 09/12/1929         Patient Gender: M Patient Age:   22 years Exam Location:  [facility] Procedure:      VAS US  LOWER EXTREMITY VENOUS (DVT) Referring Phys: Mardy Shall DOUTOVA --------------------------------------------------------------------------------  Indications: Edema.  Risk Factors: None identified. Limitations: Poor ultrasound/tissue interface. Comparison Study: No prior studies. Performing Technologist: Lerry Ransom RVT  Examination Guidelines: A complete evaluation includes B-mode imaging, spectral Doppler, color Doppler, and power Doppler as needed of all accessible portions of each vessel. Bilateral testing is considered an integral part of a complete examination. Limited examinations for reoccurring indications may be performed as noted. The reflux portion of the exam is performed with the patient in reverse Trendelenburg.  +---------+---------------+---------+-----------+----------+--------------+ RIGHT    CompressibilityPhasicitySpontaneityPropertiesThrombus Aging +---------+---------------+---------+-----------+----------+--------------+ CFV      Full           Yes      Yes                                 +---------+---------------+---------+-----------+----------+--------------+ SFJ      Full                                                        +---------+---------------+---------+-----------+----------+--------------+ FV Prox  Full                                                        +---------+---------------+---------+-----------+----------+--------------+ FV Mid   Full                                                        +---------+---------------+---------+-----------+----------+--------------+ FV DistalFull                                                         +---------+---------------+---------+-----------+----------+--------------+ PFV      Full                                                        +---------+---------------+---------+-----------+----------+--------------+  POP      Full           Yes      Yes                                 +---------+---------------+---------+-----------+----------+--------------+ PTV      Full                                                        +---------+---------------+---------+-----------+----------+--------------+ PERO     Full                                                        +---------+---------------+---------+-----------+----------+--------------+   +----+---------------+---------+-----------+----------+--------------+ LEFTCompressibilityPhasicitySpontaneityPropertiesThrombus Aging +----+---------------+---------+-----------+----------+--------------+ CFV Full           Yes      Yes                                 +----+---------------+---------+-----------+----------+--------------+    Summary: RIGHT: - There is no evidence of deep vein thrombosis in the lower extremity.  - No cystic structure found in the popliteal fossa.  LEFT: - No evidence of common femoral vein obstruction.   *See table(s) above for measurements and observations. Electronically signed by Irvin Mantel on 02/21/2024 at 4:52:01 PM.    Final    DG CHEST PORT 1 VIEW Result Date: 02/21/2024 CLINICAL DATA:  Preoperative examination. EXAM: PORTABLE CHEST 1 VIEW COMPARISON:  09/11/2011 FINDINGS: Cardiac shadow is stable. Tortuous thoracic aorta is again seen. The lungs are clear. No bony abnormality is noted. IMPRESSION: No acute abnormality noted. Electronically Signed   By: Violeta Grey M.D.   On: 02/21/2024 01:42   CT ABDOMEN PELVIS W CONTRAST Result Date: 02/20/2024 CLINICAL DATA:  Left lower quadrant abdominal pain EXAM: CT ABDOMEN AND PELVIS WITH CONTRAST TECHNIQUE: Multidetector CT imaging  of the abdomen and pelvis was performed using the standard protocol following bolus administration of intravenous contrast. RADIATION DOSE REDUCTION: This exam was performed according to the departmental dose-optimization program which includes automated exposure control, adjustment of the mA and/or kV according to patient size and/or use of iterative reconstruction technique. CONTRAST:  OMNIPAQUE  IOHEXOL  300 MG/ML  SOLN COMPARISON:  CT abdomen pelvis 10/15/2021 and PET/CT 03/01/2023 FINDINGS: Lower chest: No acute abnormality. Hepatobiliary: Heterogenous perfusion in the liver. No acute abnormality. Unremarkable gallbladder and biliary tree. Pancreas: Atrophic. Coarse calcifications. Prominent pancreatic duct measuring 5 mm. No acute abnormality. Spleen: Unremarkable. Adrenals/Urinary Tract: Unremarkable adrenal glands. No urinary calculi or hydronephrosis. Unremarkable bladder. Stomach/Bowel: Stomach is within normal limits. Normal caliber large and small bowel. Extensive colonic diverticulosis without evidence of diverticulitis. Herniation of a loop of small bowel into the left inguinal hernia. There is mild dilation of the small bowel upstream from the herniated bowel measuring 2.8 cm. There is mild wall thickening and hyperenhancement of the small bowel in the hernia with adjacent fluid and trace stranding. Vascular/Lymphatic: Aortic atherosclerotic calcification. Confluent lymphadenopathy at the aortic bifurcation measures 2.1 x 4.6 cm this has decreased in size from PET/CT 03/01/2023  when it measured 3.1 x 5.3 cm using similar to measuring technique. Reproductive: Fiducial markers about the prostate. Other: No free intraperitoneal air. Musculoskeletal: No acute fracture. Increased heterogenous sclerosis in the pelvis and visualized thoracolumbar spine compatible with metastatic disease. IMPRESSION: 1. Left inguinal hernia containing a loop of small bowel. There is mild dilation of the small bowel  upstream from the herniated bowel measuring 2.8 cm. There is mild wall thickening and hyperenhancement of the small bowel in the hernia with adjacent fluid and trace stranding. Findings are concerning for early or partial small bowel obstruction from incarcerated hernia. Surgical consult is recommended. 2. Confluent lymphadenopathy at the aortic bifurcation has decreased in size from PET/CT 03/01/2023. 3. Increased heterogenous sclerosis in the pelvis and visualized thoracolumbar spine compatible with metastatic disease plus or minus post treatment change. 4. Sequela of chronic pancreatitis. 5. Aortic Atherosclerosis (ICD10-I70.0). Electronically Signed   By: Rozell Cornet M.D.   On: 02/20/2024 23:27      Subjective: Patient seen and examined at bedside.  Denies any fever, chest pain, worsening abdominal pain or vomiting.   Discharge Exam: Vitals:   02/24/24 0523 02/24/24 0831  BP: 116/69 (!) 103/55  Pulse: (!) 56 (!) 57  Resp: 15   Temp: 98 F (36.7 C)   SpO2: 100%     General: On room air.  No distress.   ENT/neck: No thyromegaly.  JVD is not elevated  respiratory: Decreased breath sounds at bases bilaterally with some crackles; no wheezing  CVS: S1-S2 heard, mild intermittent bradycardia present Abdominal: Soft, nontender, slightly distended; no organomegaly, normal bowel sounds are heard Extremities: Trace lower extremity edema; no cyanosis  CNS: Awake; remains slow to respond and a poor historian.  No focal neurologic deficit.  Moves extremities Lymph: No obvious lymphadenopathy Skin: No obvious ecchymosis/lesions  psych: Mostly flat affect.  Currently not agitated.   Musculoskeletal: No obvious joint swelling/deformity    The results of significant diagnostics from this hospitalization (including imaging, microbiology, ancillary and laboratory) are listed below for reference.     Microbiology: Recent Results (from the past 240 hours)  Surgical pcr screen     Status: None    Collection Time: 02/21/24  5:22 AM   Specimen: Nasal Mucosa; Nasal Swab  Result Value Ref Range Status   MRSA, PCR NEGATIVE NEGATIVE Final   Staphylococcus aureus NEGATIVE NEGATIVE Final    Comment: (NOTE) The Xpert SA Assay (FDA approved for NASAL specimens in patients 84 years of age and older), is one component of a comprehensive surveillance program. It is not intended to diagnose infection nor to guide or monitor treatment. Performed at [facility], [address], [place], [place] [address]      Labs: BNP (last 3 results) No results for input(s): BNP in the last 8760 hours. Basic Metabolic Panel: Recent Labs  Lab 02/20/24 2142 02/21/24 0328 02/22/24 0452 02/23/24 0458 02/24/24 0459  NA 141 138 135 136 139  K 3.8 4.1 3.7 3.5 3.5  CL 106 103 105 106 109  CO2 21* 22 22 23 24   GLUCOSE 171* 134* 109* 96 94  BUN 23 20 20  30* 29*  CREATININE 1.08 0.87 0.92 1.14 0.94  CALCIUM 9.3 9.0 8.2* 8.0* 8.1*  MG  --  2.0  --   --   --   PHOS  --  2.7  --   --   --    Liver Function Tests: Recent Labs  Lab 02/20/24 2142 02/21/24 0328  AST 32 29  ALT 12 11  ALKPHOS 62 58  BILITOT 0.9 1.3*  PROT 7.6 7.3  ALBUMIN 3.9 3.6   Recent Labs  Lab 02/20/24 2142  LIPASE 21   No results for input(s): AMMONIA in the last 168 hours. CBC: Recent Labs  Lab 02/20/24 2142 02/21/24 0328 02/22/24 0452 02/23/24 0458 02/24/24 0459  WBC 5.7 5.4 5.7 4.7 3.8*  NEUTROABS  --   --  4.6 3.6 2.6  HGB 10.3* 10.4* 8.8* 8.1* 7.6*  HCT 33.6* 32.8* 27.1* 25.2* 23.4*  MCV 98.5 97.0 95.8 95.5 95.1  PLT 104* 100* 89* 81* 79*   Cardiac Enzymes: No results for input(s): CKTOTAL, CKMB, CKMBINDEX, TROPONINI in the last 168 hours. BNP: Invalid input(s): POCBNP CBG: No results for input(s): GLUCAP in the last 168 hours. D-Dimer No results for input(s): DDIMER in the last 72 hours. Hgb A1c No results for input(s): HGBA1C in the last 72 hours. Lipid  Profile No results for input(s): CHOL, HDL, LDLCALC, TRIG, CHOLHDL, LDLDIRECT in the last 72 hours. Thyroid  function studies No results for input(s): TSH, T4TOTAL, T3FREE, THYROIDAB in the last 72 hours.  Invalid input(s): FREET3 Anemia work up No results for input(s): VITAMINB12, FOLATE, FERRITIN, TIBC, IRON, RETICCTPCT in the last 72 hours. Urinalysis No results found for: COLORURINE, APPEARANCEUR, LABSPEC, PHURINE, GLUCOSEU, HGBUR, BILIRUBINUR, KETONESUR, PROTEINUR, UROBILINOGEN, NITRITE, LEUKOCYTESUR Sepsis Labs Recent Labs  Lab 02/21/24 0328 02/22/24 0452 02/23/24 0458 02/24/24 0459  WBC 5.4 5.7 4.7 3.8*   Microbiology Recent Results (from the past 240 hours)  Surgical pcr screen     Status: None   Collection Time: 02/21/24  5:22 AM   Specimen: Nasal Mucosa; Nasal Swab  Result Value Ref Range Status   MRSA, PCR NEGATIVE NEGATIVE Final   Staphylococcus aureus NEGATIVE NEGATIVE Final    Comment: (NOTE) The Xpert SA Assay (FDA approved for NASAL specimens in patients 26 years of age and older), is one component of a comprehensive surveillance program. It is not intended to diagnose infection nor to guide or monitor treatment. Performed at [facility], [address], [place], [place] [address]      Time coordinating discharge: 35 minutes  SIGNED:   Audria Leather, MD  [facility] 02/24/2024, 9:35 AM

## 2024-02-24 NOTE — Progress Notes (Signed)
 Report given to [facility] at [facility]. TOC notified family and PTAR called.

## 2024-02-24 NOTE — Progress Notes (Signed)
 Attempted report 3x to [facility]. Sent to voicemail.

## 2024-03-01 ENCOUNTER — Encounter (HOSPITAL_COMMUNITY): Payer: Self-pay | Admitting: General Surgery

## 2024-04-06 ENCOUNTER — Inpatient Hospital Stay: Attending: Physician Assistant

## 2024-04-06 ENCOUNTER — Inpatient Hospital Stay: Admitting: Hematology and Oncology

## 2024-04-06 ENCOUNTER — Other Ambulatory Visit: Payer: Self-pay | Admitting: Hematology and Oncology

## 2024-04-06 VITALS — BP 126/72 | HR 65 | Temp 97.9°F | Resp 13 | Wt 116.5 lb

## 2024-04-06 DIAGNOSIS — C778 Secondary and unspecified malignant neoplasm of lymph nodes of multiple regions: Secondary | ICD-10-CM | POA: Insufficient documentation

## 2024-04-06 DIAGNOSIS — Z192 Hormone resistant malignancy status: Secondary | ICD-10-CM

## 2024-04-06 DIAGNOSIS — C61 Malignant neoplasm of prostate: Secondary | ICD-10-CM | POA: Diagnosis not present

## 2024-04-06 DIAGNOSIS — Z79620 Long term (current) use of immunosuppressive biologic: Secondary | ICD-10-CM | POA: Diagnosis not present

## 2024-04-06 DIAGNOSIS — C7951 Secondary malignant neoplasm of bone: Secondary | ICD-10-CM | POA: Diagnosis present

## 2024-04-06 DIAGNOSIS — Z79899 Other long term (current) drug therapy: Secondary | ICD-10-CM | POA: Insufficient documentation

## 2024-04-06 LAB — CBC WITH DIFFERENTIAL (CANCER CENTER ONLY)
Abs Immature Granulocytes: 0.01 K/uL (ref 0.00–0.07)
Basophils Absolute: 0 K/uL (ref 0.0–0.1)
Basophils Relative: 1 %
Eosinophils Absolute: 0 K/uL (ref 0.0–0.5)
Eosinophils Relative: 2 %
HCT: 27.3 % — ABNORMAL LOW (ref 39.0–52.0)
Hemoglobin: 8.9 g/dL — ABNORMAL LOW (ref 13.0–17.0)
Immature Granulocytes: 0 %
Lymphocytes Relative: 16 %
Lymphs Abs: 0.4 K/uL — ABNORMAL LOW (ref 0.7–4.0)
MCH: 30.8 pg (ref 26.0–34.0)
MCHC: 32.6 g/dL (ref 30.0–36.0)
MCV: 94.5 fL (ref 80.0–100.0)
Monocytes Absolute: 0.3 K/uL (ref 0.1–1.0)
Monocytes Relative: 12 %
Neutro Abs: 1.8 K/uL (ref 1.7–7.7)
Neutrophils Relative %: 69 %
Platelet Count: 110 K/uL — ABNORMAL LOW (ref 150–400)
RBC: 2.89 MIL/uL — ABNORMAL LOW (ref 4.22–5.81)
RDW: 13.6 % (ref 11.5–15.5)
WBC Count: 2.7 K/uL — ABNORMAL LOW (ref 4.0–10.5)
nRBC: 0 % (ref 0.0–0.2)

## 2024-04-06 LAB — CMP (CANCER CENTER ONLY)
ALT: 5 U/L (ref 0–44)
AST: 19 U/L (ref 15–41)
Albumin: 3.5 g/dL (ref 3.5–5.0)
Alkaline Phosphatase: 56 U/L (ref 38–126)
Anion gap: 6 (ref 5–15)
BUN: 30 mg/dL — ABNORMAL HIGH (ref 8–23)
CO2: 26 mmol/L (ref 22–32)
Calcium: 9 mg/dL (ref 8.9–10.3)
Chloride: 107 mmol/L (ref 98–111)
Creatinine: 1.13 mg/dL (ref 0.61–1.24)
GFR, Estimated: >60 mL/min (ref >60)
Glucose, Bld: 94 mg/dL (ref 70–99)
Potassium: 3.9 mmol/L (ref 3.5–5.1)
Sodium: 139 mmol/L (ref 135–145)
Total Bilirubin: 0.4 mg/dL (ref 0.0–1.2)
Total Protein: 6.7 g/dL (ref 6.5–8.1)

## 2024-04-06 NOTE — Progress Notes (Signed)
 [facility] [phone]   [phone]  PROGRESS NOTE  Patient Care Team: Elliot Charm, MD as PCP - General (Internal Medicine) Vertell Pont, RN as Oncology Nurse Navigator  Hematological/Oncological History # Castration-Resistant Advanced Prostate Cancer  11/2021: started eligard  and Xtandi 160 mg PO daily.  08/12/2022: last visit with Dr. Amadeo 11/11/2022: transition care to Dr. Federico  01/20/2022: approximate start date of Zytiga  1000 mg PO daily with prednisone  5 mg 03/01/2023: PET PSMA showed progression of disease of metastatic adenopathy in the LEFT supraclavicular nodal station, LEFT paratracheal nodal station,pelvic adenopathy and retroperitoneal nodal stations.Interval progression of prostate cancer skeletal metastasis with several new lesions.  05/11/2023: Pluvicto  treatment (1 out of planned 6) 06/17/2023: Pluvicto  treatment (2 out of planned 6) 07/29/2023: Pluvicto  treatment (3 out of planned 6) 09/14/2023: Pluvicto  treatment (4 out of planned 6) 11/02/2023: Pluvicto  treatment (5 out of planned 6) 12/16/2023: Pluvicto  treatment (6 out of planned 6)  Interval History:  Todd Holmes 89 y.o. male with medical history significant for Castration-Resistant Advanced Prostate Cancer  presents for a follow up visit. In the interim, he has completed his final pluvicto  treatment in April 2025. He is accompanied by his cousin for this visit.    Mr. Bogue reports he has been well overall in the interim since our last visit.  He notes that he tolerated his Pluvicto  therapy well after having completed it back in April 2025.  He notes that he also had a hernia surgery performed recently and he tolerated it well.  He had 2 prior hernia surgeries back in 1980s.  He reports he is not currently having any pain.  He notes he is eating well.  He is doing his best to try to increase his weight as it has been steadily declining, dropping down to 116 today.   He was 120 pounds back in March 2025.  He otherwise denies any fevers, chills, sweats, nausea, vomiting or diarrhea.  Full 10 point ROS is otherwise negative.   MEDICAL HISTORY:  Past Medical History:  Diagnosis Date   Arthritis    knees   Cancer (HCC)    prostate, tx 2005- radiation    Hypertension    Keloid    keloid across chest, pt. unsure of how he encountered it   Memory disorder 11/28/2014   Neuromuscular disorder (HCC)    benign- tremor- occas., treated /w nadolol   Tremor 11/28/2014    SURGICAL HISTORY: Past Surgical History:  Procedure Laterality Date   CATARACT EXTRACTION W/PHACO  09/23/2011   Procedure: CATARACT EXTRACTION PHACO AND INTRAOCULAR LENS PLACEMENT (IOC);  Surgeon: Gaither Quan, MD;  Location: [facility] OR;  Service: Ophthalmology;  Laterality: Right;   EYE SURGERY Bilateral    L cataract removed & IOL   HERNIA REPAIR     bilateral hernia repair, inguinal, 1980's     INGUINAL HERNIA REPAIR Left 02/21/2024   Procedure: LAPAROSCOPIC REPAIR OF INCARCERATED LEFT INGUINAL HERNIA WITH MESH;  Surgeon: Kinsinger, Herlene Righter, MD;  Location: [facility] ORS;  Service: General;  Laterality: Left;    SOCIAL HISTORY: Social History   Socioeconomic History   Marital status: Single    Spouse name: Not on file   Number of children: 0   Years of education: 14   Highest education level: Not on file  Occupational History   Occupation: retired  Tobacco Use   Smoking status: Never   Smokeless tobacco: Never  Substance and Sexual Activity   Alcohol  use: No  Drug use: No   Sexual activity: Not on file  Other Topics Concern   Not on file  Social History Narrative   Patient is right handed.   Patient does not drink caffeine.   Social Drivers of Corporate investment banker Strain: Not on file  Food Insecurity: No Food Insecurity (02/21/2024)   Hunger Vital Sign    Worried About Running Out of Food in the Last Year: Never true    Ran Out of Food in the Last Year: Never true   Transportation Needs: No Transportation Needs (02/21/2024)   PRAPARE - Administrator, Civil Service (Medical): No    Lack of Transportation (Non-Medical): No  Physical Activity: Not on file  Stress: Not on file  Social Connections: Unknown (02/21/2024)   Social Connection and Isolation Panel    Frequency of Communication with Friends and Family: More than three times a week    Frequency of Social Gatherings with Friends and Family: Not on file    Attends Religious Services: More than 4 times per year    Active Member of [employer] or Organizations: Yes    Attends Banker Meetings: More than 4 times per year    Marital Status: Patient declined  Intimate Partner Violence: Not At Risk (02/21/2024)   Humiliation, Afraid, Rape, and Kick questionnaire    Fear of Current or Ex-Partner: No    Emotionally Abused: No    Physically Abused: No    Sexually Abused: No    FAMILY HISTORY: Family History  Problem Relation Age of Onset   Anesthesia problems Neg Hx    Hypotension Neg Hx    Malignant hyperthermia Neg Hx    Pseudochol deficiency Neg Hx    Cancer Mother    Stroke Father    Lymphoma Brother     ALLERGIES:  has no known allergies.  MEDICATIONS:  Current Outpatient Medications  Medication Sig Dispense Refill   metoprolol  succinate (TOPROL -XL) 100 MG 24 hr tablet Take 100 mg by mouth daily. (Patient taking differently: Take 25 mg by mouth daily.)     escitalopram  (LEXAPRO ) 10 MG tablet Take 10 mg by mouth daily.     MYRBETRIQ  25 MG TB24 tablet Take 25 mg by mouth daily.     ondansetron  (ZOFRAN ) 4 MG tablet Take 1 tablet (4 mg total) by mouth every 6 (six) hours as needed for nausea. 20 tablet 0   polyethylene glycol (MIRALAX  / GLYCOLAX ) 17 g packet Take 17 g by mouth daily. 14 each 0   No current facility-administered medications for this visit.    REVIEW OF SYSTEMS:   Constitutional: ( - ) fevers, ( - )  chills , ( - ) night sweats Eyes: ( - ) blurriness of  vision, ( - ) double vision, ( - ) watery eyes Ears, nose, mouth, throat, and face: ( - ) mucositis, ( - ) sore throat Respiratory: ( - ) cough, ( - ) dyspnea, ( - ) wheezes Cardiovascular: ( - ) palpitation, ( - ) chest discomfort, ( - ) lower extremity swelling Gastrointestinal:  ( - ) nausea, ( - ) heartburn, ( - ) change in bowel habits Skin: ( - ) abnormal skin rashes Lymphatics: ( - ) new lymphadenopathy, ( - ) easy bruising Neurological: ( - ) numbness, ( - ) tingling, ( - ) new weaknesses Behavioral/Psych: ( - ) mood change, ( - ) new changes  All other systems were reviewed with the patient and  are negative.  PHYSICAL EXAMINATION: Vitals:   04/06/24 1524  BP: 126/72  Pulse: 65  Resp: 13  Temp: 97.9 F (36.6 C)  SpO2: 95%       Constitutional: Oriented to person, place, and time and well-developed, well-nourished, and in no distress.  HENT:  Head: Normocephalic and atraumatic.  Eyes: Conjunctivae are normal. Right eye exhibits no discharge. Left eye exhibits no discharge. No scleral icterus.  Cardiovascular: Normal rate, regular rhythm, normal heart sounds Pulmonary/Chest: Effort normal and breath sounds normal. No respiratory distress. No wheezes. No rales.  Musculoskeletal: Normal range of motion. Exhibits no edema.  Neurological: Alert and oriented to person, place, and time. Exhibits normal muscle tone. Gait normal. Coordination normal.  Skin: Skin is warm and dry. No rash noted. Not diaphoretic. No erythema. No pallor.  Psychiatric: Mood, memory and judgment normal.    LABORATORY DATA:  I have reviewed the data as listed    Latest Ref Rng & Units 04/06/2024    3:00 PM 02/24/2024    4:59 AM 02/23/2024    4:58 AM  CBC  WBC 4.0 - 10.5 K/uL 2.7  3.8  4.7   Hemoglobin 13.0 - 17.0 g/dL 8.9  7.6  8.1   Hematocrit 39.0 - 52.0 % 27.3  23.4  25.2   Platelets 150 - 400 K/uL 110  79  81        Latest Ref Rng & Units 04/06/2024    3:00 PM 02/24/2024    4:59 AM  02/23/2024    4:58 AM  CMP  Glucose 70 - 99 mg/dL 94  94  96   BUN 8 - 23 mg/dL 30  29  30    Creatinine 0.61 - 1.24 mg/dL 8.86  9.05  8.85  C  Sodium 135 - 145 mmol/L 139  139  136   Potassium 3.5 - 5.1 mmol/L 3.9  3.5  3.5   Chloride 98 - 111 mmol/L 107  109  106   CO2 22 - 32 mmol/L 26  24  23    Calcium 8.9 - 10.3 mg/dL 9.0  8.1  8.0   Total Protein 6.5 - 8.1 g/dL 6.7     Total Bilirubin 0.0 - 1.2 mg/dL 0.4     Alkaline Phos 38 - 126 U/L 56     AST 15 - 41 U/L 19     ALT 0 - 44 U/L 5       C Corrected result    RADIOGRAPHIC STUDIES: No results found.  ASSESSMENT & PLAN ADRAIN NESBIT is a 89 y.o. male with medical history significant for Castration-Resistant Advanced Prostate Cancer  presents for a follow up visit.   # Castration-Resistant Advanced Prostate Cancer  --Continue Eligard  therapy with urology.  Continue to receive as scheduled. --Patient completed a full 6 cycles of Pluvicto  therapy --Labs from today showed WBC 2.7, hemoglobin 8.9, MCV 94.5, platelets 110, Creatinine and LFTs normal. PSA level pending. Last PSA from 02/02/2024 was 238 (previously 1507 on 05/03/2024) -- Return to clinic in 8 weeks.  If stable can extend visits to q 3 months after that point if PSA is stable.   No orders of the defined types were placed in this encounter.  All questions were answered. The patient knows to call the clinic with any problems, questions or concerns.   I have spent a total of 30 minutes minutes of face-to-face and non-face-to-face time, preparing to see the patient, performing a medically appropriate examination, counseling and educating  the patient,  documenting clinical information in the electronic health record, independently interpreting results and communicating results to the patient, and care coordination.    Norleen IVAR Kidney, MD Department of Hematology/Oncology [facility] at [facility] Phone: [phone] Pager: [phone] Email:  [email or web address]    04/09/2024 10:58 PM

## 2024-04-07 LAB — TESTOSTERONE: Testosterone: 3 ng/dL — ABNORMAL LOW (ref 264–916)

## 2024-04-07 LAB — PROSTATE-SPECIFIC AG, SERUM (LABCORP): Prostate Specific Ag, Serum: 401 ng/mL — ABNORMAL HIGH (ref 0.0–4.0)

## 2024-05-31 ENCOUNTER — Other Ambulatory Visit: Payer: Self-pay | Admitting: Physician Assistant

## 2024-05-31 DIAGNOSIS — Z192 Hormone resistant malignancy status: Secondary | ICD-10-CM

## 2024-06-01 ENCOUNTER — Inpatient Hospital Stay: Attending: Physician Assistant

## 2024-06-01 ENCOUNTER — Inpatient Hospital Stay: Admitting: Physician Assistant

## 2024-06-01 DIAGNOSIS — Z192 Hormone resistant malignancy status: Secondary | ICD-10-CM | POA: Insufficient documentation

## 2024-06-01 DIAGNOSIS — C61 Malignant neoplasm of prostate: Secondary | ICD-10-CM | POA: Insufficient documentation

## 2024-06-01 LAB — CMP (CANCER CENTER ONLY)
ALT: 8 U/L (ref 0–44)
AST: 27 U/L (ref 15–41)
Albumin: 3.8 g/dL (ref 3.5–5.0)
Alkaline Phosphatase: 67 U/L (ref 38–126)
Anion gap: 6 (ref 5–15)
BUN: 29 mg/dL — ABNORMAL HIGH (ref 8–23)
CO2: 32 mmol/L (ref 22–32)
Calcium: 9 mg/dL (ref 8.9–10.3)
Chloride: 105 mmol/L (ref 98–111)
Creatinine: 1.17 mg/dL (ref 0.61–1.24)
GFR, Estimated: 58 mL/min — ABNORMAL LOW (ref >60)
Glucose, Bld: 116 mg/dL — ABNORMAL HIGH (ref 70–99)
Potassium: 3.8 mmol/L (ref 3.5–5.1)
Sodium: 143 mmol/L (ref 135–145)
Total Bilirubin: 0.5 mg/dL (ref 0.0–1.2)
Total Protein: 7.1 g/dL (ref 6.5–8.1)

## 2024-06-01 LAB — CBC WITH DIFFERENTIAL (CANCER CENTER ONLY)
Abs Immature Granulocytes: 0.01 K/uL (ref 0.00–0.07)
Basophils Absolute: 0 K/uL (ref 0.0–0.1)
Basophils Relative: 1 %
Eosinophils Absolute: 0.1 K/uL (ref 0.0–0.5)
Eosinophils Relative: 3 %
HCT: 29.9 % — ABNORMAL LOW (ref 39.0–52.0)
Hemoglobin: 9.8 g/dL — ABNORMAL LOW (ref 13.0–17.0)
Immature Granulocytes: 0 %
Lymphocytes Relative: 18 %
Lymphs Abs: 0.6 K/uL — ABNORMAL LOW (ref 0.7–4.0)
MCH: 30.2 pg (ref 26.0–34.0)
MCHC: 32.8 g/dL (ref 30.0–36.0)
MCV: 92 fL (ref 80.0–100.0)
Monocytes Absolute: 0.3 K/uL (ref 0.1–1.0)
Monocytes Relative: 10 %
Neutro Abs: 2 K/uL (ref 1.7–7.7)
Neutrophils Relative %: 68 %
Platelet Count: 114 K/uL — ABNORMAL LOW (ref 150–400)
RBC: 3.25 MIL/uL — ABNORMAL LOW (ref 4.22–5.81)
RDW: 14 % (ref 11.5–15.5)
WBC Count: 3 K/uL — ABNORMAL LOW (ref 4.0–10.5)
nRBC: 0 % (ref 0.0–0.2)

## 2024-06-02 LAB — PROSTATE-SPECIFIC AG, SERUM (LABCORP): Prostate Specific Ag, Serum: 822 ng/mL — ABNORMAL HIGH (ref 0.0–4.0)

## 2024-06-11 ENCOUNTER — Emergency Department (HOSPITAL_COMMUNITY)

## 2024-06-11 ENCOUNTER — Encounter (HOSPITAL_COMMUNITY): Payer: Self-pay

## 2024-06-11 ENCOUNTER — Observation Stay (HOSPITAL_COMMUNITY)

## 2024-06-11 ENCOUNTER — Inpatient Hospital Stay (HOSPITAL_COMMUNITY)
Admission: EM | Admit: 2024-06-11 | Discharge: 2024-06-19 | DRG: 683 | Disposition: A | Discharge status: Skilled Nursing Facility | Source: Ambulatory Visit | Attending: Internal Medicine | Admitting: Internal Medicine

## 2024-06-11 ENCOUNTER — Other Ambulatory Visit: Payer: Self-pay

## 2024-06-11 DIAGNOSIS — N131 Hydronephrosis with ureteral stricture, not elsewhere classified: Secondary | ICD-10-CM | POA: Diagnosis present

## 2024-06-11 DIAGNOSIS — D6959 Other secondary thrombocytopenia: Secondary | ICD-10-CM | POA: Diagnosis present

## 2024-06-11 DIAGNOSIS — I10 Essential (primary) hypertension: Secondary | ICD-10-CM | POA: Diagnosis present

## 2024-06-11 DIAGNOSIS — D630 Anemia in neoplastic disease: Secondary | ICD-10-CM | POA: Diagnosis present

## 2024-06-11 DIAGNOSIS — N179 Acute kidney failure, unspecified: Secondary | ICD-10-CM | POA: Diagnosis not present

## 2024-06-11 DIAGNOSIS — Z66 Do not resuscitate: Secondary | ICD-10-CM | POA: Diagnosis present

## 2024-06-11 DIAGNOSIS — H109 Unspecified conjunctivitis: Secondary | ICD-10-CM | POA: Diagnosis present

## 2024-06-11 DIAGNOSIS — C61 Malignant neoplasm of prostate: Secondary | ICD-10-CM

## 2024-06-11 DIAGNOSIS — N138 Other obstructive and reflux uropathy: Secondary | ICD-10-CM | POA: Diagnosis present

## 2024-06-11 DIAGNOSIS — Z961 Presence of intraocular lens: Secondary | ICD-10-CM | POA: Diagnosis present

## 2024-06-11 DIAGNOSIS — Z9842 Cataract extraction status, left eye: Secondary | ICD-10-CM

## 2024-06-11 DIAGNOSIS — R54 Age-related physical debility: Secondary | ICD-10-CM | POA: Diagnosis present

## 2024-06-11 DIAGNOSIS — Z8546 Personal history of malignant neoplasm of prostate: Secondary | ICD-10-CM

## 2024-06-11 DIAGNOSIS — E860 Dehydration: Secondary | ICD-10-CM | POA: Diagnosis present

## 2024-06-11 DIAGNOSIS — C7951 Secondary malignant neoplasm of bone: Secondary | ICD-10-CM | POA: Diagnosis present

## 2024-06-11 DIAGNOSIS — N134 Hydroureter: Secondary | ICD-10-CM

## 2024-06-11 DIAGNOSIS — E870 Hyperosmolality and hypernatremia: Secondary | ICD-10-CM | POA: Diagnosis not present

## 2024-06-11 DIAGNOSIS — E876 Hypokalemia: Secondary | ICD-10-CM | POA: Diagnosis not present

## 2024-06-11 DIAGNOSIS — Z79899 Other long term (current) drug therapy: Secondary | ICD-10-CM

## 2024-06-11 DIAGNOSIS — Z9841 Cataract extraction status, right eye: Secondary | ICD-10-CM

## 2024-06-11 LAB — COMPREHENSIVE METABOLIC PANEL WITH GFR
ALT: 10 U/L (ref 0–44)
AST: 38 U/L (ref 15–41)
Albumin: 4.1 g/dL (ref 3.5–5.0)
Alkaline Phosphatase: 67 U/L (ref 38–126)
Anion gap: 16 — ABNORMAL HIGH (ref 5–15)
BUN: 46 mg/dL — ABNORMAL HIGH (ref 8–23)
CO2: 22 mmol/L (ref 22–32)
Calcium: 9.2 mg/dL (ref 8.9–10.3)
Chloride: 102 mmol/L (ref 98–111)
Creatinine, Ser: 3.19 mg/dL — ABNORMAL HIGH (ref 0.61–1.24)
GFR, Estimated: 17 mL/min — ABNORMAL LOW (ref >60)
Glucose, Bld: 133 mg/dL — ABNORMAL HIGH (ref 70–99)
Potassium: 3.7 mmol/L (ref 3.5–5.1)
Sodium: 141 mmol/L (ref 135–145)
Total Bilirubin: 0.9 mg/dL (ref 0.0–1.2)
Total Protein: 7.4 g/dL (ref 6.5–8.1)

## 2024-06-11 LAB — CBC WITH DIFFERENTIAL/PLATELET
Abs Immature Granulocytes: 0.02 K/uL (ref 0.00–0.07)
Basophils Absolute: 0 K/uL (ref 0.0–0.1)
Basophils Relative: 0 %
Eosinophils Absolute: 0 K/uL (ref 0.0–0.5)
Eosinophils Relative: 0 %
HCT: 31.5 % — ABNORMAL LOW (ref 39.0–52.0)
Hemoglobin: 10.1 g/dL — ABNORMAL LOW (ref 13.0–17.0)
Immature Granulocytes: 0 %
Lymphocytes Relative: 8 %
Lymphs Abs: 0.4 K/uL — ABNORMAL LOW (ref 0.7–4.0)
MCH: 30.1 pg (ref 26.0–34.0)
MCHC: 32.1 g/dL (ref 30.0–36.0)
MCV: 93.8 fL (ref 80.0–100.0)
Monocytes Absolute: 0.5 K/uL (ref 0.1–1.0)
Monocytes Relative: 10 %
Neutro Abs: 3.9 K/uL (ref 1.7–7.7)
Neutrophils Relative %: 82 %
Platelets: 105 K/uL — ABNORMAL LOW (ref 150–400)
RBC: 3.36 MIL/uL — ABNORMAL LOW (ref 4.22–5.81)
RDW: 14.2 % (ref 11.5–15.5)
WBC: 4.8 K/uL (ref 4.0–10.5)
nRBC: 0 % (ref 0.0–0.2)

## 2024-06-11 LAB — URINALYSIS, ROUTINE W REFLEX MICROSCOPIC
Bacteria, UA: NONE SEEN
Bilirubin Urine: NEGATIVE
Glucose, UA: NEGATIVE mg/dL
Ketones, ur: NEGATIVE mg/dL
Leukocytes,Ua: NEGATIVE
Nitrite: NEGATIVE
Protein, ur: NEGATIVE mg/dL
RBC / HPF: >50 RBC/hpf (ref 0–5)
Specific Gravity, Urine: 1.009 (ref 1.005–1.030)
pH: 5 (ref 5.0–8.0)

## 2024-06-11 MED ORDER — TRAZODONE HCL 50 MG PO TABS
25.0000 mg | ORAL_TABLET | Freq: Every evening | ORAL | Status: DC | PRN
  Administered 2024-06-12 – 2024-06-18 (×4): 25 mg via ORAL
  Filled 2024-06-11 (×4): qty 1

## 2024-06-11 MED ORDER — TRAMADOL HCL 50 MG PO TABS
50.0000 mg | ORAL_TABLET | Freq: Four times a day (QID) | ORAL | Status: DC | PRN
  Administered 2024-06-14 – 2024-06-18 (×4): 50 mg via ORAL
  Filled 2024-06-11 (×4): qty 1

## 2024-06-11 MED ORDER — ONDANSETRON HCL 4 MG/2ML IJ SOLN
4.0000 mg | Freq: Four times a day (QID) | INTRAMUSCULAR | Status: DC | PRN
  Administered 2024-06-12 – 2024-06-13 (×2): 4 mg via INTRAVENOUS
  Filled 2024-06-11 (×2): qty 2

## 2024-06-11 MED ORDER — ACETAMINOPHEN 325 MG PO TABS: 650.0000 mg | ORAL_TABLET | Freq: Four times a day (QID) | ORAL | Status: DC | PRN

## 2024-06-11 MED ORDER — ESCITALOPRAM OXALATE 20 MG PO TABS
10.0000 mg | ORAL_TABLET | Freq: Every day | ORAL | Status: DC
  Administered 2024-06-12 – 2024-06-19 (×8): 10 mg via ORAL
  Filled 2024-06-11 (×8): qty 1

## 2024-06-11 MED ORDER — SODIUM CHLORIDE 0.9 % IV SOLN
2.0000 g | Freq: Once | INTRAVENOUS | Status: DC
  Filled 2024-06-11: qty 12.5

## 2024-06-11 MED ORDER — ALBUTEROL SULFATE (2.5 MG/3ML) 0.083% IN NEBU: 2.5000 mg | INHALATION_SOLUTION | RESPIRATORY_TRACT | Status: DC | PRN

## 2024-06-11 MED ORDER — ENOXAPARIN SODIUM 40 MG/0.4ML IJ SOSY: 40.0000 mg | PREFILLED_SYRINGE | INTRAMUSCULAR | Status: DC

## 2024-06-11 MED ORDER — HEPARIN SODIUM (PORCINE) 5000 UNIT/ML IJ SOLN
5000.0000 [IU] | Freq: Three times a day (TID) | INTRAMUSCULAR | Status: DC
  Administered 2024-06-11 – 2024-06-13 (×5): 5000 [IU] via SUBCUTANEOUS
  Filled 2024-06-11 (×5): qty 1

## 2024-06-11 MED ORDER — MIRABEGRON ER 25 MG PO TB24
25.0000 mg | ORAL_TABLET | Freq: Every day | ORAL | Status: DC
  Administered 2024-06-12 – 2024-06-19 (×8): 25 mg via ORAL
  Filled 2024-06-11 (×8): qty 1

## 2024-06-11 MED ORDER — SODIUM CHLORIDE 0.9 % IV BOLUS
500.0000 mL | Freq: Once | INTRAVENOUS | Status: AC
  Administered 2024-06-11: 500 mL via INTRAVENOUS

## 2024-06-11 MED ORDER — METRONIDAZOLE 500 MG/100ML IV SOLN
500.0000 mg | Freq: Once | INTRAVENOUS | Status: DC
  Filled 2024-06-11: qty 100

## 2024-06-11 MED ORDER — ACETAMINOPHEN 650 MG RE SUPP: 650.0000 mg | Freq: Four times a day (QID) | RECTAL | Status: DC | PRN

## 2024-06-11 MED ORDER — ONDANSETRON HCL 4 MG PO TABS
4.0000 mg | ORAL_TABLET | Freq: Four times a day (QID) | ORAL | Status: DC | PRN
  Administered 2024-06-12: 4 mg via ORAL
  Filled 2024-06-11: qty 1

## 2024-06-11 MED ORDER — METOPROLOL SUCCINATE ER 25 MG PO TB24: 25.0000 mg | ORAL_TABLET | Freq: Every day | ORAL | Status: DC

## 2024-06-11 MED ORDER — SODIUM CHLORIDE 0.9 % IV SOLN: INTRAVENOUS | Status: DC

## 2024-06-11 MED ORDER — CHLORHEXIDINE GLUCONATE CLOTH 2 % EX PADS
6.0000 | MEDICATED_PAD | Freq: Every day | CUTANEOUS | Status: DC
  Administered 2024-06-12 – 2024-06-19 (×8): 6 via TOPICAL

## 2024-06-11 NOTE — ED Triage Notes (Signed)
 Patient here with left flank pain starting yesterday. Sent here by UC after UA dip. Has stage four metastatic prostate cancer and has been losing weight. Denies any current urinary symptoms.

## 2024-06-11 NOTE — ED Provider Notes (Signed)
 An error was found by the CT department who realized that the wrong patient had been CT scanned today with a CT abdomen pelvis and a CT lumbar spine and the report entered into this patient's chart.  The read reported concerning for perforated viscus.    At the time of the original read, I discussed this with the radiologist at that time and consulted Dr. Vanderbilt with general surgery and my attending physician.  Broad-spectrum antibiotics were ordered as well as sepsis orders.  When the error with the CT scan was identified, the orders were canceled including the antibiotics.  Dr. Vanderbilt had already seen the patient and was made aware while he was in the room with the patient that this CT was an error and the patient had not been to CT yet.  Patient was admitted to the hospialist service, Dr. Roxane was notified of the CT error.   Error was dicussed with the patient and his cousin with myself and attending, Dr. Charlyn.   The CT report for this patient has been reviewed, reveals bilateral hydro ureter.  I have requested a postvoid residual bladder scan.  I discussed this CT report with Dr. Shona with [facility] hospitalist service who will notify the admitting team and follow on his postvoid residual.   Beverley Leita LABOR, PA-C 06/11/24 1952    Charlyn Sora, MD 06/11/24 2234

## 2024-06-11 NOTE — Progress Notes (Signed)
 Overnight cross coverage: Was made aware by EDP of abnormal CT abdomen pelvis without contrast revealing: Interval development of bilateral mild to moderate hydroureteronephrosis.   Postvoid bladder scan ordered and is pending.  As needed In-N-Out cath also ordered.   No charge note.

## 2024-06-11 NOTE — Progress Notes (Addendum)
 ED Pharmacy Antibiotic Sign Off An antibiotic consult was received from an ED provider for cefepime per pharmacy dosing for sepsis. A chart review was completed to assess appropriateness.   The following one time order(s) were placed:  Cefepime 2g IV x1  Further antibiotic and/or antibiotic pharmacy consults should be ordered by the admitting provider if indicated.   Thank you for allowing pharmacy to be a part of this patient's care.   Wanda Hasting PharmD, BCPS [facility] main pharmacy [phone] 06/11/2024 4:12 PM    Addendum, 6:39 PM  Notified by ED PA that CT results were incorrectly reported and antibiotics orders are canceled. No antibiotics were given.   Wanda Hasting PharmD, BCPS [facility] pharmacy [phone] 06/11/2024 6:39 PM

## 2024-06-11 NOTE — ED Notes (Signed)
 Collected pt's urine, sent down to lab

## 2024-06-11 NOTE — ED Provider Notes (Signed)
 [place] EMERGENCY DEPARTMENT AT [facility] Provider Note   CSN: [id number] Arrival date & time: 06/11/24  1145     Patient presents with: Flank Pain   Todd Holmes is a 89 y.o. male.   89 year old male presents with his cousin with concern for left lower back pain, onset 2 days ago.  History of stage IV prostate cancer, last had radiation 3 months ago.  Recent labs with uptrending PSA.  Denies nausea, vomiting, changes in bowel or bladder habits.  Able to sometimes get comfortable with Tylenol  or changes in position.  Pain is not necessarily worse with movement, pain does not radiate.       Prior to Admission medications   Medication Sig Start Date End Date Taking? Authorizing Provider  escitalopram  (LEXAPRO ) 10 MG tablet Take 10 mg by mouth daily. 05/07/22   [provider]  metoprolol  succinate (TOPROL -XL) 100 MG 24 hr tablet Take 100 mg by mouth daily. Patient taking differently: Take 25 mg by mouth daily. 05/07/22   [provider]  MYRBETRIQ  25 MG TB24 tablet Take 25 mg by mouth daily. 05/07/22   [provider]  ondansetron  (ZOFRAN ) 4 MG tablet Take 1 tablet (4 mg total) by mouth every 6 (six) hours as needed for nausea. 02/24/24   Cheryle Page, MD  polyethylene glycol (MIRALAX  / GLYCOLAX ) 17 g packet Take 17 g by mouth daily. 02/25/24   Cheryle Page, MD    Allergies: Patient has no known allergies.    Review of Systems Negative except as per HPI Updated Vital Signs BP (!) 173/86 (BP Location: Left Arm)   Pulse 65   Temp 97.7 F (36.5 C) (Oral)   Resp (!) 22   SpO2 99%   Physical Exam Vitals and nursing note reviewed.  Constitutional:      General: He is not in acute distress.    Appearance: He is well-developed. He is not diaphoretic.  HENT:     Head: Normocephalic and atraumatic.  Pulmonary:     Effort: Pulmonary effort is normal.  Abdominal:     Palpations: Abdomen is soft.     Tenderness: There is no abdominal  tenderness.  Musculoskeletal:        General: Tenderness present. No swelling or deformity.     Lumbar back: Tenderness present. No bony tenderness.       Back:     Right lower leg: No edema.     Left lower leg: No edema.  Skin:    General: Skin is warm and dry.     Findings: No erythema or rash.  Neurological:     Mental Status: He is alert and oriented to person, place, and time.     Sensory: No sensory deficit.     Motor: No weakness.  Psychiatric:        Behavior: Behavior normal.     (all labs ordered are listed, but only abnormal results are displayed) Labs Reviewed  CBC WITH DIFFERENTIAL/PLATELET - Abnormal; Notable for the following components:      Result Value   RBC 3.36 (*)    Hemoglobin 10.1 (*)    HCT 31.5 (*)    Platelets 105 (*)    Lymphs Abs 0.4 (*)    All other components within normal limits  COMPREHENSIVE METABOLIC PANEL WITH GFR - Abnormal; Notable for the following components:   Glucose, Bld 133 (*)    BUN 46 (*)    Creatinine, Ser 3.19 (*)  GFR, Estimated 17 (*)    Anion gap 16 (*)    All other components within normal limits  URINALYSIS, ROUTINE W REFLEX MICROSCOPIC - Abnormal; Notable for the following components:   Hgb urine dipstick LARGE (*)    All other components within normal limits  BASIC METABOLIC PANEL WITH GFR  CBC    EKG: None  Radiology: Narrative & Impression  CLINICAL DATA:  left flank pain starting yesterday. Sent here by [facility] after UA dip. Has stage four metastatic prostate cancer and has been losing weight. Denies any current urinary symptoms.   EXAM: CT LUMBAR SPINE WITHOUT CONTRAST   TECHNIQUE: Multidetector CT imaging of the lumbar spine was performed without intravenous contrast administration. Multiplanar CT image reconstructions were also generated.   RADIATION DOSE REDUCTION: This exam was performed according to the departmental dose-optimization program which includes automated exposure control, adjustment  of the mA and/or kV according to patient size and/or use of iterative reconstruction technique.   COMPARISON:  CT abdomen pelvis 02/20/2024   FINDINGS: Segmentation: 5 lumbar type vertebrae.   Alignment: Similar-appearing levoscoliosis of the lumbar spine centered at the L3-L4 level.   Vertebrae: Diffusely decreased bone density. Similar-appearing scattered blastic osseous metastasis of the lumbar spine. Redemonstration of lytic and blastic osseous lesions of the sacrum. No acute fracture or focal pathologic process.   Paraspinal and other soft tissues: Negative.   Disc levels: Multilevel intervertebral disc space narrowing and vacuum phenomenon.   IMPRESSION: 1. Similar-appearing scattered blastic osseous metastasis of the lumbar spine. Redemonstration of lytic and blastic osseous lesions of the sacrum. 2. No acute displaced fracture or traumatic listhesis of the lumbar spine. 3. Diffusely decreased bone density.     Electronically Signed   By: Morgane  Naveau M.D.   On: 06/11/2024 19:16   Narrative & Impression  CLINICAL DATA:  Abdominal pain, acute, nonlocalized left flank pain   EXAM: CT ABDOMEN AND PELVIS WITHOUT CONTRAST   TECHNIQUE: Multidetector CT imaging of the abdomen and pelvis was performed following the standard protocol without IV contrast.   RADIATION DOSE REDUCTION: This exam was performed according to the departmental dose-optimization program which includes automated exposure control, adjustment of the mA and/or kV according to patient size and/or use of iterative reconstruction technique.   COMPARISON:  CT abdomen pelvis 02/20/2024   FINDINGS: Lower chest: Trace left pleural effusion.  Tiny hiatal hernia.   Hepatobiliary: No focal liver abnormality. No gallstones, gallbladder wall thickening, or pericholecystic fluid. No biliary dilatation.   Pancreas: Diffuse coarse calcifications. Diffusely atrophic. No focal lesion. Otherwise normal  pancreatic contour. No surrounding inflammatory changes. No main pancreatic ductal dilatation. ductal dilatation.   Spleen: Normal in size without focal abnormality.   Adrenals/Urinary Tract:   No adrenal nodule bilaterally.   Interval development of bilateral mild to moderate hydroureteronephrosis. No nephrolithiasis. No ureterolithiasis.   The urinary bladder is unremarkable.   Stomach/Bowel: Stomach is within normal limits. No evidence of bowel wall thickening or dilatation. Diffuse colonic diverticulosis. Appendix appears normal.   Vascular/Lymphatic: Phleboliths noted along the pelvis. No abdominal aorta or iliac aneurysm. Moderate atherosclerotic plaque of the aorta and its branches. Difficult to measure on this noncontrast study persistent retroperitoneal and pelvic lymphadenopathy.   Reproductive: Prostate is unremarkable with radiation seeds noted.   Other: Interval development of diffuse mild mesenteric edema. Interval development of trace volume intraperitoneal free fluid. No intraperitoneal free gas. No organized fluid collection.   Musculoskeletal:   Status post left inguinal hernia repair with  mesh. No recurrent hernia. No abdominal wall hernia or abnormality.   Lytic and blastic metastatic lesions of the sacrum again noted. Please see separately dictated CT lumbar spine 06/11/2024. No acute displaced fracture.   IMPRESSION: 1. Interval development of bilateral mild to moderate hydroureteronephrosis. No nephroureterolithiasis bilaterally. This may reflect the changes of obstructive uropathy or reflux. 2. Trace left pleural effusion. 3. Interval development of mild mesenteric edema and trace volume simple free fluid ascites. 4. Tiny hiatal hernia. 5. Chronic pancreatitis with no findings of acute pancreatitis. 6. Colonic diverticulosis with no acute diverticulitis. 7. Difficult to measure on this noncontrast study persistent retroperitoneal and pelvic  lymphadenopathy. Persistent lytic and blastic metastatic lesions in a patient with known prostate cancer.     Electronically Signed   By: Morgane  Naveau M.D.   On: 06/11/2024 19:10    .Critical Care  Performed by: Beverley Leita LABOR, PA-C Authorized by: Beverley Leita LABOR, PA-C   Critical care provider statement:    Critical care time (minutes):  30   Critical care was time spent personally by me on the following activities:  Development of treatment plan with patient or surrogate, discussions with consultants, evaluation of patient's response to treatment, examination of patient, ordering and review of laboratory studies, ordering and review of radiographic studies, ordering and performing treatments and interventions, pulse oximetry, re-evaluation of patient's condition and review of old charts    Medications Ordered in the ED  metoprolol  succinate (TOPROL -XL) 24 hr tablet 25 mg (has no administration in time range)  escitalopram  (LEXAPRO ) tablet 10 mg (has no administration in time range)  mirabegron  ER (MYRBETRIQ ) tablet 25 mg (has no administration in time range)  acetaminophen  (TYLENOL ) tablet 650 mg (has no administration in time range)    Or  acetaminophen  (TYLENOL ) suppository 650 mg (has no administration in time range)  traZODone (DESYREL) tablet 25 mg (has no administration in time range)  ondansetron  (ZOFRAN ) tablet 4 mg (has no administration in time range)    Or  ondansetron  (ZOFRAN ) injection 4 mg (has no administration in time range)  albuterol (PROVENTIL) (2.5 MG/3ML) 0.083% nebulizer solution 2.5 mg (has no administration in time range)  0.9 %  sodium chloride  infusion (has no administration in time range)  traMADol  (ULTRAM ) tablet 50 mg (has no administration in time range)  heparin injection 5,000 Units (has no administration in time range)  Chlorhexidine  Gluconate Cloth 2 % PADS 6 each (has no administration in time range)  sodium chloride  0.9 % bolus 500 mL (0 mLs  Intravenous Stopped 06/11/24 1821)                                    Medical Decision Making Amount and/or Complexity of Data Reviewed Labs: ordered. Radiology: ordered.  Risk Prescription drug management. Decision regarding hospitalization.   This patient presents to the ED for concern of left lower back pain, this involves an extensive number of treatment options, and is a complaint that carries with it a high risk of complications and morbidity.  The differential diagnosis includes ureteral stone, metastatic process, compression fracture, colitis   Co morbidities / Chronic conditions that complicate the patient evaluation  Stage IV prostate cancer, hypertension, left inguinal hernia repair 02/21/2024   Additional history obtained:  Additional history obtained from EMR External records from outside source obtained and reviewed including prior labs and imaging on file   Lab Tests:  I Ordered,  and personally interpreted labs.  The pertinent results include: CBC with normal WBC, mild anemia although hemoglobin improved compared to prior.  CMP with AKI with creatinine of 3.19, previously 1.0.  Urinalysis with large hemoglobin without evidence of infection.    Imaging Studies ordered:  I ordered imaging studies including CT a/p, CT lumbar spine  I independently visualized and interpreted imaging which showed hydroureter  I agree with the radiologist interpretation   Cardiac Monitoring: / EKG:  The patient was maintained on a cardiac monitor.  I personally viewed and interpreted the cardiac monitored which showed an underlying rhythm of: Sinus rhythm, rate 62   Problem List / ED Course / Critical interventions / Medication management  89 year old male brought in by cousin with concern for pain in his left lower back.  No associated symptoms.  History of metastatic prostate cancer.  He is found to have tenderness in his left lower back.  Given history of metastatic prostate  cancer, concern for intra-abdominal as well as spinal pathology.  Ordered CT abdomen pelvis without contrast (new AKI today) as well as CT lumbar spine.  He is found to have bilateral hydroureter in addition to his known AKI.  Request postvoid residual.  Patient has already been moved to the floor.  Discussed this finding with Dr. Shona with [facility] hospitalist service. I ordered medication including IV fluids Reevaluation of the patient after these medicines showed that the patient remained stable I have reviewed the patients home medicines and have made adjustments as needed   Consultations Obtained:  I requested consultation with the ER attending, Dr. Charlyn,  and discussed lab and imaging findings as well as pertinent plan - they recommend: Admission Patient discussed with Dr. Roxane with [facility] hospitalist service will consult for admission.   Social Determinants of Health:  Has PCP   Test / Admission - Considered:  admit      Final diagnoses:  AKI (acute kidney injury)  Prostate cancer metastatic to bone [facility])  Hydroureter    ED Discharge Orders     None          Beverley Leita DELENA DEVONNA 06/11/24 KENITH Charlyn Sora, MD 06/11/24 2245

## 2024-06-11 NOTE — H&P (Signed)
 History and Physical  Todd Holmes FMW:[id number] DOB: [birth year] DOA: 06/11/2024  PCP: Elliot Charm, MD   Chief Complaint: left sided back pain   HPI: Todd Holmes is a 89 y.o. male with medical history significant for hypertension, prostate cancer being admitted to the hospital with AKI.  History supplied by the patient as well as his cousin who is at the bedside with him this evening.  Cousin states that the patient does not drink enough fluids at baseline, of late he has been having some increased urinary frequency and has been even less motivated to drink fluids.  He feels like he has been eating like he normally does, based on oncology notes it seems he has been struggling with his weight for the last several months.  Overall, the patient has otherwise been doing well, denies any significant weight loss, fevers, chills, significant abdominal pain.  States that for the past 24 hours, he has been having some pain in the lower left lateral back.  He denies any recent trauma, any urgency or frequency of urination, any gross hematuria or other concerns.  He does not have chronic back pain, denies any radiation of his pain into his hip, groin, or down his leg.  Review of Systems: Please see HPI for pertinent positives and negatives. A complete 10 system review of systems are otherwise negative.  Past Medical History:  Diagnosis Date   Arthritis    knees   Cancer (HCC)    prostate, tx 2005- radiation    Hypertension    Keloid    keloid across chest, pt. unsure of how he encountered it   Memory disorder 11/28/2014   Neuromuscular disorder (HCC)    benign- tremor- occas., treated /w nadolol   Tremor 11/28/2014   Past Surgical History:  Procedure Laterality Date   CATARACT EXTRACTION W/PHACO  09/23/2011   Procedure: CATARACT EXTRACTION PHACO AND INTRAOCULAR LENS PLACEMENT (IOC);  Surgeon: Gaither Quan, MD;  [facility];  Service: Ophthalmology;  Laterality: Right;   EYE  SURGERY Bilateral    L cataract removed & IOL   HERNIA REPAIR     bilateral hernia repair, inguinal, 1980's     INGUINAL HERNIA REPAIR Left 02/21/2024   Procedure: LAPAROSCOPIC REPAIR OF INCARCERATED LEFT INGUINAL HERNIA WITH MESH;  Surgeon: Kinsinger, Herlene Righter, MD;  Location: [facility] ORS;  Service: General;  Laterality: Left;   Social History:  reports that he has never smoked. He has never used smokeless tobacco. He reports that he does not drink alcohol  and does not use drugs.  No Known Allergies  Family History  Problem Relation Age of Onset   Anesthesia problems Neg Hx    Hypotension Neg Hx    Malignant hyperthermia Neg Hx    Pseudochol deficiency Neg Hx    Cancer Mother    Stroke Father    Lymphoma Brother      Prior to Admission medications   Medication Sig Start Date End Date Taking? Authorizing Provider  escitalopram  (LEXAPRO ) 10 MG tablet Take 10 mg by mouth daily. 05/07/22   [provider]  metoprolol  succinate (TOPROL -XL) 100 MG 24 hr tablet Take 100 mg by mouth daily. Patient taking differently: Take 25 mg by mouth daily. 05/07/22   [provider]  MYRBETRIQ  25 MG TB24 tablet Take 25 mg by mouth daily. 05/07/22   [provider]  ondansetron  (ZOFRAN ) 4 MG tablet Take 1 tablet (4 mg total) by mouth every 6 (six) hours as needed for  nausea. 02/24/24   Cheryle Page, MD  polyethylene glycol (MIRALAX  / GLYCOLAX ) 17 g packet Take 17 g by mouth daily. 02/25/24   Cheryle Page, MD    Physical Exam: BP (!) 173/86   Pulse 65   Temp 98 F (36.7 C) (Oral)   Resp (!) 22   SpO2 99%  General:  Alert, oriented, calm, in no acute distress, patient looks well-nourished well-developed and much younger than his stated age Cardiovascular: RRR, no murmurs or rubs, no peripheral edema  Respiratory: clear to auscultation bilaterally, no wheezes, no crackles  Abdomen: soft, nontender, nondistended, normal bowel tones heard, patient without overlying skin changes,  but he is tender to palpation on the left lateral posterior flank, no bony tenderness over the spine or ribs Skin: dry, no rashes  Musculoskeletal: no joint effusions, normal range of motion  Psychiatric: appropriate affect, normal speech  Neurologic: extraocular muscles intact, clear speech, moving all extremities with intact sensorium         Labs on Admission:  Basic Metabolic Panel: Recent Labs  Lab 06/11/24 1233  NA 141  K 3.7  CL 102  CO2 22  GLUCOSE 133*  BUN 46*  CREATININE 3.19*  CALCIUM 9.2   Liver Function Tests: Recent Labs  Lab 06/11/24 1233  AST 38  ALT 10  ALKPHOS 67  BILITOT 0.9  PROT 7.4  ALBUMIN 4.1   No results for input(s): LIPASE, AMYLASE in the last 168 hours. No results for input(s): AMMONIA in the last 168 hours. CBC: Recent Labs  Lab 06/11/24 1233  WBC 4.8  NEUTROABS 3.9  HGB 10.1*  HCT 31.5*  MCV 93.8  PLT 105*   Cardiac Enzymes: No results for input(s): CKTOTAL, CKMB, CKMBINDEX, TROPONINI in the last 168 hours. BNP (last 3 results) No results for input(s): BNP in the last 8760 hours.  ProBNP (last 3 results) No results for input(s): PROBNP in the last 8760 hours.  CBG: No results for input(s): GLUCAP in the last 168 hours.  Radiological Exams on Admission:  DG Chest Port 1 View Result Date: 06/11/2024 CLINICAL DATA:  Questionable sepsis-evaluate for abnormality. EXAM: PORTABLE CHEST 1 VIEW COMPARISON:  Radiographs 02/21/2024 and 09/11/2011. Abdominal CT 06/11/2024. FINDINGS: 1625 hours. Low lung volumes with mildly increased atelectasis at both lung bases. The heart size and mediastinal contours are stable with aortic and brachycephalic tortuosity. There is no confluent airspace disease, pneumothorax or significant pleural effusion. Thoracolumbar scoliosis without evidence of acute osseous abnormality. IMPRESSION: Low lung volumes with mildly increased bibasilar atelectasis. No evidence of pneumonia.  Electronically Signed   By: Elsie Perone M.D.   On: 06/11/2024 16:57   Assessment/Plan Todd Holmes is a 89 y.o. male with medical history significant for hypertension, prostate cancer being admitted to the hospital with AKI.   Acute kidney injury-I suspect this is mainly related to dehydration, as the patient has recently been reducing his oral fluid intake due to frequent urination.  No obvious infectious symptoms. -Observation admission -Hydrate gently -Avoid nephrotoxins -Follow-up CT abdomen pelvis, to rule out obstruction or other pathology  Left flank pain-unclear etiology, he does have some microscopic blood on his urinalysis but no CVA tenderness.  Rather, he is tender in the soft tissues his left flank.  May be a musculoskeletal pain. -Follow-up CT abdomen pelvis to rule out kidney stone, acute infection, etc. -Tramadol  for moderate pain  Hypertension-was previously on Toprol -XL, as well as amlodipine .  Amlodipine  was discontinued a few months ago when he was  hospitalized for hernia repair. -Continue Toprol -XL -If remains hypertensive, consider resumption of amlodipine   Normocytic anemia-chronic and stable  Thrombocytopenia-chronic and stable  DVT prophylaxis: Subcutaneous heparin    Code Status: Limited: Do not attempt resuscitation (DNR) -DNR-LIMITED -Do Not Intubate/DNI -confirmed with the patient extensively in the presence of his cousin  Consults called: None  Admission status: Observation  Time spent: 49 minutes  Tino Ronan CHRISTELLA Gail MD [facility] Pager [phone]  If 7PM-7AM, please contact night-coverage [email or web address] Password [facility]  06/11/2024, 6:17 PM

## 2024-06-12 ENCOUNTER — Encounter (HOSPITAL_COMMUNITY): Payer: Self-pay | Admitting: Internal Medicine

## 2024-06-12 DIAGNOSIS — D6959 Other secondary thrombocytopenia: Secondary | ICD-10-CM | POA: Diagnosis present

## 2024-06-12 DIAGNOSIS — R54 Age-related physical debility: Secondary | ICD-10-CM | POA: Diagnosis present

## 2024-06-12 DIAGNOSIS — H109 Unspecified conjunctivitis: Secondary | ICD-10-CM | POA: Diagnosis present

## 2024-06-12 DIAGNOSIS — Z9841 Cataract extraction status, right eye: Secondary | ICD-10-CM | POA: Diagnosis not present

## 2024-06-12 DIAGNOSIS — E870 Hyperosmolality and hypernatremia: Secondary | ICD-10-CM | POA: Diagnosis not present

## 2024-06-12 DIAGNOSIS — Z8546 Personal history of malignant neoplasm of prostate: Secondary | ICD-10-CM | POA: Diagnosis not present

## 2024-06-12 DIAGNOSIS — N134 Hydroureter: Secondary | ICD-10-CM | POA: Diagnosis present

## 2024-06-12 DIAGNOSIS — I10 Essential (primary) hypertension: Secondary | ICD-10-CM | POA: Diagnosis present

## 2024-06-12 DIAGNOSIS — E876 Hypokalemia: Secondary | ICD-10-CM | POA: Diagnosis not present

## 2024-06-12 DIAGNOSIS — Z66 Do not resuscitate: Secondary | ICD-10-CM | POA: Diagnosis present

## 2024-06-12 DIAGNOSIS — N138 Other obstructive and reflux uropathy: Secondary | ICD-10-CM | POA: Diagnosis present

## 2024-06-12 DIAGNOSIS — N131 Hydronephrosis with ureteral stricture, not elsewhere classified: Secondary | ICD-10-CM | POA: Diagnosis present

## 2024-06-12 DIAGNOSIS — Z961 Presence of intraocular lens: Secondary | ICD-10-CM | POA: Diagnosis present

## 2024-06-12 DIAGNOSIS — C7951 Secondary malignant neoplasm of bone: Secondary | ICD-10-CM | POA: Diagnosis present

## 2024-06-12 DIAGNOSIS — D630 Anemia in neoplastic disease: Secondary | ICD-10-CM | POA: Diagnosis present

## 2024-06-12 DIAGNOSIS — E860 Dehydration: Secondary | ICD-10-CM | POA: Diagnosis present

## 2024-06-12 DIAGNOSIS — Z9842 Cataract extraction status, left eye: Secondary | ICD-10-CM | POA: Diagnosis not present

## 2024-06-12 DIAGNOSIS — N179 Acute kidney failure, unspecified: Secondary | ICD-10-CM | POA: Diagnosis present

## 2024-06-12 DIAGNOSIS — Z79899 Other long term (current) drug therapy: Secondary | ICD-10-CM | POA: Diagnosis not present

## 2024-06-12 LAB — BASIC METABOLIC PANEL WITH GFR
Anion gap: 16 — ABNORMAL HIGH (ref 5–15)
Anion gap: 17 — ABNORMAL HIGH (ref 5–15)
Anion gap: 21 — ABNORMAL HIGH (ref 5–15)
BUN: 53 mg/dL — ABNORMAL HIGH (ref 8–23)
BUN: 54 mg/dL — ABNORMAL HIGH (ref 8–23)
BUN: 57 mg/dL — ABNORMAL HIGH (ref 8–23)
CO2: 20 mmol/L — ABNORMAL LOW (ref 22–32)
CO2: 19 mmol/L — ABNORMAL LOW (ref 22–32)
CO2: 17 mmol/L — ABNORMAL LOW (ref 22–32)
Calcium: 8.5 mg/dL — ABNORMAL LOW (ref 8.9–10.3)
Calcium: 8.6 mg/dL — ABNORMAL LOW (ref 8.9–10.3)
Calcium: 8.3 mg/dL — ABNORMAL LOW (ref 8.9–10.3)
Chloride: 104 mmol/L (ref 98–111)
Chloride: 105 mmol/L (ref 98–111)
Chloride: 104 mmol/L (ref 98–111)
Creatinine, Ser: 4.25 mg/dL — ABNORMAL HIGH (ref 0.61–1.24)
Creatinine, Ser: 4.48 mg/dL — ABNORMAL HIGH (ref 0.61–1.24)
Creatinine, Ser: 4.58 mg/dL — ABNORMAL HIGH (ref 0.61–1.24)
GFR, Estimated: 12 mL/min — ABNORMAL LOW (ref >60)
GFR, Estimated: 12 mL/min — ABNORMAL LOW (ref >60)
GFR, Estimated: 11 mL/min — ABNORMAL LOW (ref >60)
Glucose, Bld: 83 mg/dL (ref 70–99)
Glucose, Bld: 101 mg/dL — ABNORMAL HIGH (ref 70–99)
Glucose, Bld: 157 mg/dL — ABNORMAL HIGH (ref 70–99)
Potassium: 3.7 mmol/L (ref 3.5–5.1)
Potassium: 3.8 mmol/L (ref 3.5–5.1)
Potassium: 3.9 mmol/L (ref 3.5–5.1)
Sodium: 140 mmol/L (ref 135–145)
Sodium: 140 mmol/L (ref 135–145)
Sodium: 142 mmol/L (ref 135–145)

## 2024-06-12 LAB — CREATININE, URINE, RANDOM: Creatinine, Urine: 32 mg/dL

## 2024-06-12 LAB — SODIUM, URINE, RANDOM: Sodium, Ur: 124 mmol/L

## 2024-06-12 LAB — CBC
HCT: 28.1 % — ABNORMAL LOW (ref 39.0–52.0)
Hemoglobin: 8.8 g/dL — ABNORMAL LOW (ref 13.0–17.0)
MCH: 29.2 pg (ref 26.0–34.0)
MCHC: 31.3 g/dL (ref 30.0–36.0)
MCV: 93.4 fL (ref 80.0–100.0)
Platelets: 92 K/uL — ABNORMAL LOW (ref 150–400)
RBC: 3.01 MIL/uL — ABNORMAL LOW (ref 4.22–5.81)
RDW: 14.1 % (ref 11.5–15.5)
WBC: 4.8 K/uL (ref 4.0–10.5)
nRBC: 0 % (ref 0.0–0.2)

## 2024-06-12 LAB — GLUCOSE, CAPILLARY: Glucose-Capillary: 173 mg/dL — ABNORMAL HIGH (ref 70–99)

## 2024-06-12 MED ORDER — HYDROMORPHONE HCL 1 MG/ML IJ SOLN: 0.2500 mg | INTRAMUSCULAR | Status: DC | PRN

## 2024-06-12 MED ORDER — LACTATED RINGERS IV BOLUS
250.0000 mL | Freq: Once | INTRAVENOUS | Status: AC | PRN
  Administered 2024-06-12: 250 mL via INTRAVENOUS

## 2024-06-12 MED ORDER — SODIUM CHLORIDE 0.9 % IV SOLN
12.5000 mg | Freq: Four times a day (QID) | INTRAVENOUS | Status: DC | PRN
  Administered 2024-06-12: 12.5 mg via INTRAVENOUS
  Filled 2024-06-12: qty 0.5

## 2024-06-12 MED ORDER — SODIUM CHLORIDE 0.9 % IV BOLUS
2000.0000 mL | Freq: Once | INTRAVENOUS | Status: AC
  Administered 2024-06-12: 2000 mL via INTRAVENOUS

## 2024-06-12 MED ORDER — HYDRALAZINE HCL 20 MG/ML IJ SOLN
10.0000 mg | INTRAMUSCULAR | Status: DC | PRN
  Administered 2024-06-12 – 2024-06-13 (×2): 10 mg via INTRAVENOUS
  Filled 2024-06-12 (×2): qty 1

## 2024-06-12 MED ORDER — AMLODIPINE BESYLATE 5 MG PO TABS
5.0000 mg | ORAL_TABLET | Freq: Every day | ORAL | Status: DC
  Administered 2024-06-12 – 2024-06-19 (×8): 5 mg via ORAL
  Filled 2024-06-12 (×8): qty 1

## 2024-06-12 NOTE — Plan of Care (Signed)

## 2024-06-12 NOTE — Consult Note (Signed)
 Renal Service Consult Note [facility] Lamar JONETTA Fret, MD  Patient: Todd Holmes Date: 06/12/2024 Requesting Physician: Dr. LOIS Applebaum  Reason for Consult: Renal failure HPI: The patient is a 89 y.o. year-old w/ PMH as below who presented to ED yesterday c/o L flank pain x 1-2 days. Hx prostate cancer stage IV, losing wt. No voiding symptoms. Poor po intake recently. Some ^'d urinary frequency. In ED bp was 184/103, HR 67, RR 14, temp 98. 97% sat on RA. CXR showed no edema.  CT abd noncontrast showed new development of bilateral mild to moderate hydroureteronephrosis; no nephrolithiasis or ureterolithiasis.Urinary bladder was unremarkable. Lab showed creat 3.1 yest and 4.4 today (b/l 1.17 on 06/01/24).  K+ 3.7, CO2 22, BUN 46, glu 133, alb 4.1, LFT's ok. eGFR 17 ml/min. WBC 4K, Hb 8.8, plt 92k. Pt was admitted. We are asked to see for renal failure.    Pt seen in room. Pt is a vague historian. L lower back pain a little better today. No CP, no SOB, mild ankle swelling. Appetite ok.    ROS - denies CP, no joint pain, no HA, no blurry vision, no rash, no diarrhea, no nausea/ vomiting   Past Medical History  Past Medical History:  Diagnosis Date   Arthritis    knees   Cancer (HCC)    prostate, tx 2005- radiation    Hypertension    Keloid    keloid across chest, pt. unsure of how he encountered it   Memory disorder 11/28/2014   Neuromuscular disorder (HCC)    benign- tremor- occas., treated /w nadolol   Tremor 11/28/2014   Past Surgical History  Past Surgical History:  Procedure Laterality Date   CATARACT EXTRACTION W/PHACO  09/23/2011   Procedure: CATARACT EXTRACTION PHACO AND INTRAOCULAR LENS PLACEMENT (IOC);  Surgeon: Gaither Quan, MD;  Location: [facility] OR;  Service: Ophthalmology;  Laterality: Right;   EYE SURGERY Bilateral    L cataract removed & IOL   HERNIA REPAIR     bilateral hernia repair, inguinal, 1980's     INGUINAL HERNIA REPAIR Left 02/21/2024    Procedure: LAPAROSCOPIC REPAIR OF INCARCERATED LEFT INGUINAL HERNIA WITH MESH;  Surgeon: Kinsinger, Herlene Righter, MD;  Location: [facility] ORS;  Service: General;  Laterality: Left;   Family History  Family History  Problem Relation Age of Onset   Anesthesia problems Neg Hx    Hypotension Neg Hx    Malignant hyperthermia Neg Hx    Pseudochol deficiency Neg Hx    Cancer Mother    Stroke Father    Lymphoma Brother    Social History  reports that he has never smoked. He has never used smokeless tobacco. He reports that he does not drink alcohol  and does not use drugs. Allergies No Known Allergies Home medications Prior to Admission medications   Medication Sig Start Date End Date Taking? Authorizing Provider  escitalopram  (LEXAPRO ) 10 MG tablet Take 10 mg by mouth in the morning. 05/07/22  Yes [provider]  melatonin 5 MG TABS Take 5 mg by mouth at bedtime as needed (for sleep).   Yes [provider]  Multiple Vitamin (MULTIVITAMIN) tablet Take 1 tablet by mouth daily with breakfast.   Yes [provider]  MYRBETRIQ  25 MG TB24 tablet Take 25 mg by mouth in the morning. 05/07/22  Yes [provider]  ondansetron  (ZOFRAN ) 4 MG tablet Take 1 tablet (4 mg total) by mouth every 6 (six) hours as needed for nausea. 02/24/24  Yes Cheryle Page, MD  TYLENOL  500 MG tablet Take 500 mg by mouth every 6 (six) hours as needed (for pain).   Yes [provider]  polyethylene glycol (MIRALAX  / GLYCOLAX ) 17 g packet Take 17 g by mouth daily. Patient not taking: Reported on 06/11/2024 02/25/24   Cheryle Page, MD     Vitals:   06/12/24 0208 06/12/24 0528 06/12/24 0950 06/12/24 1401  BP: (!) 175/90 (!) 182/84 (!) 188/88 (!) 192/89  Pulse: 69 74 70 77  Resp: 18 18 18 18   Temp: 98.8 F (37.1 C) 98.7 F (37.1 C) 97.9 F (36.6 C) (!) 97.5 F (36.4 C)  TempSrc: Oral Oral Oral Oral  SpO2: 97% 93% 97% 94%  Weight:      Height:       Exam Gen alert, no distress, on  RA Sclera anicteric, throat clear  Mild JVD R side Chest clear bilat to bases RRR no MRG Abd soft ntnd no mass or ascites +bs Ext 1+ L > R ankle edema bilat, no other edema Neuro is alert, Ox 3 , nf   Home bp meds: none  Date   Creat  eGFR (ml/min) 2012- July 2025 0.87- 1.13 > 60 ml/min 06/01/24  1.17 06/11/24  3.19 06/12/24  4.25    UA 9/28-  large Hb, neg protein, > 50 rbcs, 0-5 wbc/epi CT abd 9/28 noncontrast - Urinary Tract:Interval development of bilateral mild to moderate hydroureteronephrosis. No nephrolithiasis. No ureterolithiasis.The urinary bladder is unremarkable. Na+ 140  K+ 3.8  bun 54, creat 4.5   Hb 8.8  plt 92  WBC 4K.     Assessment/ Plan: Acute renal failure: b/l from sept 2025 was 1.17, egfr > 60 ml/min. Creatinine on admission was 3.1 yest and 4.2 today ths in the setting of bilat flank pain and stage IV prostate cancer. CT showed bilat new hydronephrosis from yesterday. UA showed hematuria, no proteinuria or wbc's. Despite IVFs overnight and foley cath placement, no improvement in creatinine. BP's are high, no hypotension, no home acei/ ARB, no nsaids. Suspect AKI is due to obstruction most likely. Recommend urology consult. Will follow.  Stage IV prostate cancer HTN: not on any BP meds at home it appears Volume: euvolemic on exam, minimal edema, CXR neg, ok for cont IVF 50 cc/hr for now.        Myer Fret  MD [facility] 06/12/2024, 2:11 PM  Recent Labs  Lab 06/12/24 0425 06/12/24 0939  CREATININE 4.25* 4.48*  K 3.7 3.8   Inpatient medications:  amLODipine   5 mg Oral Daily   Chlorhexidine  Gluconate Cloth  6 each Topical Daily   escitalopram   10 mg Oral Daily   heparin injection (subcutaneous)  5,000 Units Subcutaneous Q8H   mirabegron  ER  25 mg Oral Daily    sodium chloride  50 mL/hr at 06/12/24 1202   promethazine (PHENERGAN) injection (IM or IVPB) 12.5 mg (06/12/24 1337)   acetaminophen  **OR** acetaminophen , albuterol, HYDROmorphone (DILAUDID)  injection, ondansetron  **OR** ondansetron  (ZOFRAN ) IV, promethazine (PHENERGAN) injection (IM or IVPB), traMADol , traZODone

## 2024-06-12 NOTE — Progress Notes (Signed)
 PT Cancellation Note  Patient Details Name: Todd Holmes MRN: [id number] DOB: 05/21/1929   Cancelled Treatment:    Reason Eval/Treat Not Completed: Other (comment) Pt with nausea and vomiting.  Nursing aware and pt receiving medications for nausea.  Will f/u as able.  Benjiman, PT Acute Rehab [facility] [phone]   [name] 06/12/2024, 2:29 PM

## 2024-06-12 NOTE — Progress Notes (Signed)
 Please see emergency contact list, Morene Dollar is this patients POA and wants to be involved in decision making.   Also please call Bernice Endo in regards to SNF placement she resides with the patient.

## 2024-06-12 NOTE — TOC Initial Note (Signed)
 Transition of Care [facility]) - Initial/Assessment Note    Patient Details  Name: Todd Holmes MRN: [id number] Date of Birth: [birth year]  [facility] CM/SW Contact:    Alfonse JONELLE Rex, RN Phone Number: 06/12/2024, 3:23 PM  Clinical Narrative:  Patient admitted from home with Dx: Acute kidney injury, Hx Prostate Cancer. PT eval pending, await recommendation. TOC following.                         Patient Goals and CMS Choice            Expected Discharge Plan and Services                                              Prior Living Arrangements/Services                       Activities of Daily Living   ADL Screening (condition at time of admission) Independently performs ADLs?: Yes (appropriate for developmental age) Is the patient deaf or have difficulty hearing?: No Does the patient have difficulty seeing, even when wearing glasses/contacts?: No Does the patient have difficulty concentrating, remembering, or making decisions?: No  Permission Sought/Granted                  Emotional Assessment              Admission diagnosis:  Hydroureter [N13.4] AKI (acute kidney injury) [N17.9] Prostate cancer metastatic to bone (HCC) [C61, C79.51] Patient Active Problem List   Diagnosis Date Noted   AKI (acute kidney injury) 06/11/2024   Incarcerated hernia 02/21/2024   Essential hypertension 02/21/2024   Anemia 02/21/2024   SBO (small bowel obstruction) (HCC) 02/21/2024   Leg edema, right 02/21/2024   Metastatic castration-resistant adenocarcinoma of prostate (HCC) 05/12/2022   Memory disorder 11/28/2014   Tremor 11/28/2014   PCP:  Elliot Charm, MD Pharmacy:   [facility] [address], [place] - [address] [place] [address] Phone: [phone] Fax: [phone]     Social Drivers of Health (SDOH) Social History: SDOH Screenings   Food Insecurity: No Food  Insecurity (06/11/2024)  Housing: Low Risk  (06/11/2024)  Transportation Needs: No Transportation Needs (06/11/2024)  Utilities: Not At Risk (06/11/2024)  Depression (PHQ2-9): Low Risk  (04/06/2024)  Social Connections: Unknown (06/12/2024)  Tobacco Use: Low Risk  (06/12/2024)   SDOH Interventions:     Readmission Risk Interventions    02/24/2024    9:53 AM 02/21/2024    1:28 PM  Readmission Risk Prevention Plan  Post Dischage Appt  Complete  Medication Screening  Complete  Transportation Screening Complete Complete  PCP or Specialist Appt within 5-7 Days Complete   Home Care Screening Complete   Medication Review (RN CM) Complete

## 2024-06-12 NOTE — Plan of Care (Signed)

## 2024-06-12 NOTE — Consult Note (Signed)
 Urology Consult   Reason for consult: bilateral hydro, obstructive nephropathy  History of Present Illness: Todd Holmes is a 89 y.o. M with history of metastatic prostate cancer, now with bilateral hydronephrosis  Patient presented to the emergency department yesterday complaining of left flank pain.  A CT scan was obtained in the emergency department which showed interval development of new bilateral hydronephrosis. Labs also showed a notable creatinine elevation from baseline.  A Foley was placed but his urine output has continued to be poor.  Patient with a known history of metastatic, castrate resistant prostate cancer. Last seen in our office late 2023 by Dr Selma. Has continued with treatment through Oncology; completed course of pluvicto  earlier this year  At the time of my exam is resting comfortably in bed. Foley in place with a few cc of clear urine  Past Medical History:  Diagnosis Date   Arthritis    knees   Cancer (HCC)    prostate, tx 2005- radiation    Hypertension    Keloid    keloid across chest, pt. unsure of how he encountered it   Memory disorder 11/28/2014   Neuromuscular disorder (HCC)    benign- tremor- occas., treated /w nadolol   Tremor 11/28/2014    Past Surgical History:  Procedure Laterality Date   CATARACT EXTRACTION W/PHACO  09/23/2011   Procedure: CATARACT EXTRACTION PHACO AND INTRAOCULAR LENS PLACEMENT (IOC);  Surgeon: Gaither Quan, MD;  Location: [facility] OR;  Service: Ophthalmology;  Laterality: Right;   EYE SURGERY Bilateral    L cataract removed & IOL   HERNIA REPAIR     bilateral hernia repair, inguinal, 1980's     INGUINAL HERNIA REPAIR Left 02/21/2024   Procedure: LAPAROSCOPIC REPAIR OF INCARCERATED LEFT INGUINAL HERNIA WITH MESH;  Surgeon: Kinsinger, Herlene Righter, MD;  Location: [facility] ORS;  Service: General;  Laterality: Left;    Current Hospital Medications:  Home Meds:  No current facility-administered medications on file prior to encounter.    Current Outpatient Medications on File Prior to Encounter  Medication Sig Dispense Refill   escitalopram  (LEXAPRO ) 10 MG tablet Take 10 mg by mouth in the morning.     melatonin 5 MG TABS Take 5 mg by mouth at bedtime as needed (for sleep).     Multiple Vitamin (MULTIVITAMIN) tablet Take 1 tablet by mouth daily with breakfast.     MYRBETRIQ  25 MG TB24 tablet Take 25 mg by mouth in the morning.     ondansetron  (ZOFRAN ) 4 MG tablet Take 1 tablet (4 mg total) by mouth every 6 (six) hours as needed for nausea. 20 tablet 0   TYLENOL  500 MG tablet Take 500 mg by mouth every 6 (six) hours as needed (for pain).     polyethylene glycol (MIRALAX  / GLYCOLAX ) 17 g packet Take 17 g by mouth daily. (Patient not taking: Reported on 06/11/2024) 14 each 0     Scheduled Meds:  amLODipine   5 mg Oral Daily   Chlorhexidine  Gluconate Cloth  6 each Topical Daily   escitalopram   10 mg Oral Daily   heparin injection (subcutaneous)  5,000 Units Subcutaneous Q8H   mirabegron  ER  25 mg Oral Daily   Continuous Infusions:  sodium chloride  50 mL/hr at 06/12/24 1202   promethazine (PHENERGAN) injection (IM or IVPB) 12.5 mg (06/12/24 1337)   PRN Meds:.acetaminophen  **OR** acetaminophen , albuterol, HYDROmorphone (DILAUDID) injection, ondansetron  **OR** ondansetron  (ZOFRAN ) IV, promethazine (PHENERGAN) injection (IM or IVPB), traMADol , traZODone  Allergies: No Known Allergies  Family  History  Problem Relation Age of Onset   Anesthesia problems Neg Hx    Hypotension Neg Hx    Malignant hyperthermia Neg Hx    Pseudochol deficiency Neg Hx    Cancer Mother    Stroke Father    Lymphoma Brother     Social History:  reports that he has never smoked. He has never used smokeless tobacco. He reports that he does not drink alcohol  and does not use drugs.  ROS: A complete review of systems was performed.  All systems are negative except for pertinent findings as noted.  Physical Exam:  Vital signs in last 24  hours: Temp:  [97.5 F (36.4 C)-99.1 F (37.3 C)] 97.5 F (36.4 C) (09/29 1401) Pulse Rate:  [61-77] 77 (09/29 1401) Resp:  [18-23] 18 (09/29 1401) BP: (157-192)/(81-90) 192/89 (09/29 1401) SpO2:  [92 %-99 %] 94 % (09/29 1401) Weight:  [52.8 kg] 52.8 kg (09/28 2101) Constitutional:  Alert and oriented, No acute distress Cardiovascular: Regular rate and rhythm Respiratory: Normal respiratory effort, Lungs clear bilaterally GI: Abdomen is soft, nontender, nondistended, no abdominal masses GU: foley in place with a few cc of clear urine Neurologic: Grossly intact, no focal deficits Psychiatric: Normal mood and affect  Laboratory Data:  Recent Labs    06/11/24 1233 06/12/24 0425  WBC 4.8 4.8  HGB 10.1* 8.8*  HCT 31.5* 28.1*  PLT 105* 92*    Recent Labs    06/11/24 1233 06/12/24 0425 06/12/24 0939  NA 141 140 140  K 3.7 3.7 3.8  CL 102 104 105  GLUCOSE 133* 83 101*  BUN 46* 53* 54*  CALCIUM 9.2 8.5* 8.6*  CREATININE 3.19* 4.25* 4.48*     Results for orders placed or performed during the hospital encounter of 06/11/24 (from the past 24 hours)  Basic metabolic panel     Status: Abnormal   Collection Time: 06/12/24  4:25 AM  Result Value Ref Range   Sodium 140 135 - 145 mmol/L   Potassium 3.7 3.5 - 5.1 mmol/L   Chloride 104 98 - 111 mmol/L   CO2 20 (L) 22 - 32 mmol/L   Glucose, Bld 83 70 - 99 mg/dL   BUN 53 (H) 8 - 23 mg/dL   Creatinine, Ser 5.74 (H) 0.61 - 1.24 mg/dL   Calcium 8.5 (L) 8.9 - 10.3 mg/dL   GFR, Estimated 12 (L) >60 mL/min   Anion gap 16 (H) 5 - 15  CBC     Status: Abnormal   Collection Time: 06/12/24  4:25 AM  Result Value Ref Range   WBC 4.8 4.0 - 10.5 K/uL   RBC 3.01 (L) 4.22 - 5.81 MIL/uL   Hemoglobin 8.8 (L) 13.0 - 17.0 g/dL   HCT 71.8 (L) 60.9 - 47.9 %   MCV 93.4 80.0 - 100.0 fL   MCH 29.2 26.0 - 34.0 pg   MCHC 31.3 30.0 - 36.0 g/dL   RDW 85.8 88.4 - 84.4 %   Platelets 92 (L) 150 - 400 K/uL   nRBC 0.0 0.0 - 0.2 %  Basic metabolic  panel     Status: Abnormal   Collection Time: 06/12/24  9:39 AM  Result Value Ref Range   Sodium 140 135 - 145 mmol/L   Potassium 3.8 3.5 - 5.1 mmol/L   Chloride 105 98 - 111 mmol/L   CO2 19 (L) 22 - 32 mmol/L   Glucose, Bld 101 (H) 70 - 99 mg/dL   BUN 54 (H) 8 -  23 mg/dL   Creatinine, Ser 5.51 (H) 0.61 - 1.24 mg/dL   Calcium 8.6 (L) 8.9 - 10.3 mg/dL   GFR, Estimated 12 (L) >60 mL/min   Anion gap 17 (H) 5 - 15   No results found for this or any previous visit (from the past 240 hours).  Renal Function: Recent Labs    06/11/24 1233 06/12/24 0425 06/12/24 0939  CREATININE 3.19* 4.25* 4.48*   Estimated Creatinine Clearance: 7.6 mL/min (A) (by C-G formula based on SCr of 4.48 mg/dL (H)).  Radiologic Imaging: CT L-SPINE NO CHARGE Result Date: 06/11/2024 CLINICAL DATA:  left flank pain starting yesterday. Sent here by UC after UA dip. Has stage four metastatic prostate cancer and has been losing weight. Denies any current urinary symptoms. EXAM: CT LUMBAR SPINE WITHOUT CONTRAST TECHNIQUE: Multidetector CT imaging of the lumbar spine was performed without intravenous contrast administration. Multiplanar CT image reconstructions were also generated. RADIATION DOSE REDUCTION: This exam was performed according to the departmental dose-optimization program which includes automated exposure control, adjustment of the mA and/or kV according to patient size and/or use of iterative reconstruction technique. COMPARISON:  CT abdomen pelvis 02/20/2024 FINDINGS: Segmentation: 5 lumbar type vertebrae. Alignment: Similar-appearing levoscoliosis of the lumbar spine centered at the L3-L4 level. Vertebrae: Diffusely decreased bone density. Similar-appearing scattered blastic osseous metastasis of the lumbar spine. Redemonstration of lytic and blastic osseous lesions of the sacrum. No acute fracture or focal pathologic process. Paraspinal and other soft tissues: Negative. Disc levels: Multilevel intervertebral  disc space narrowing and vacuum phenomenon. IMPRESSION: 1. Similar-appearing scattered blastic osseous metastasis of the lumbar spine. Redemonstration of lytic and blastic osseous lesions of the sacrum. 2. No acute displaced fracture or traumatic listhesis of the lumbar spine. 3. Diffusely decreased bone density. Electronically Signed   By: Morgane  Naveau M.D.   On: 06/11/2024 19:16   CT ABDOMEN PELVIS WO CONTRAST Result Date: 06/11/2024 CLINICAL DATA:  Abdominal pain, acute, nonlocalized left flank pain EXAM: CT ABDOMEN AND PELVIS WITHOUT CONTRAST TECHNIQUE: Multidetector CT imaging of the abdomen and pelvis was performed following the standard protocol without IV contrast. RADIATION DOSE REDUCTION: This exam was performed according to the departmental dose-optimization program which includes automated exposure control, adjustment of the mA and/or kV according to patient size and/or use of iterative reconstruction technique. COMPARISON:  CT abdomen pelvis 02/20/2024 FINDINGS: Lower chest: Trace left pleural effusion.  Tiny hiatal hernia. Hepatobiliary: No focal liver abnormality. No gallstones, gallbladder wall thickening, or pericholecystic fluid. No biliary dilatation. Pancreas: Diffuse coarse calcifications. Diffusely atrophic. No focal lesion. Otherwise normal pancreatic contour. No surrounding inflammatory changes. No main pancreatic ductal dilatation. ductal dilatation. Spleen: Normal in size without focal abnormality. Adrenals/Urinary Tract: No adrenal nodule bilaterally. Interval development of bilateral mild to moderate hydroureteronephrosis. No nephrolithiasis. No ureterolithiasis. The urinary bladder is unremarkable. Stomach/Bowel: Stomach is within normal limits. No evidence of bowel wall thickening or dilatation. Diffuse colonic diverticulosis. Appendix appears normal. Vascular/Lymphatic: Phleboliths noted along the pelvis. No abdominal aorta or iliac aneurysm. Moderate atherosclerotic plaque of  the aorta and its branches. Difficult to measure on this noncontrast study persistent retroperitoneal and pelvic lymphadenopathy. Reproductive: Prostate is unremarkable with radiation seeds noted. Other: Interval development of diffuse mild mesenteric edema. Interval development of trace volume intraperitoneal free fluid. No intraperitoneal free gas. No organized fluid collection. Musculoskeletal: Status post left inguinal hernia repair with mesh. No recurrent hernia. No abdominal wall hernia or abnormality. Lytic and blastic metastatic lesions of the sacrum again noted. Please see separately  dictated CT lumbar spine 06/11/2024. No acute displaced fracture. IMPRESSION: 1. Interval development of bilateral mild to moderate hydroureteronephrosis. No nephroureterolithiasis bilaterally. This may reflect the changes of obstructive uropathy or reflux. 2. Trace left pleural effusion. 3. Interval development of mild mesenteric edema and trace volume simple free fluid ascites. 4. Tiny hiatal hernia. 5. Chronic pancreatitis with no findings of acute pancreatitis. 6. Colonic diverticulosis with no acute diverticulitis. 7. Difficult to measure on this noncontrast study persistent retroperitoneal and pelvic lymphadenopathy. Persistent lytic and blastic metastatic lesions in a patient with known prostate cancer. Electronically Signed   By: Morgane  Naveau M.D.   On: 06/11/2024 19:10   CT ABDOMEN PELVIS WO CONTRAST Addendum Date: 06/11/2024 ADDENDUM REPORT: 06/11/2024 17:49 ADDENDUM: PA Leita Chancy was notified to disregard this report for Todd Holmes due to incorrect images placed on patient's record by CT technologist. Critical Value/emergent results were called by telephone on 06/11/2024 at 5:43 pm to provider LEITA MATTER , who verbally acknowledged these results and is taking care of patient with images provided. New report will be generated within images are moved to the correct patient. Electronically Signed   By:  Leita Birmingham M.D.   On: 06/11/2024 17:49   Addendum Date: 06/11/2024 ADDENDUM REPORT: 06/11/2024 17:31 ADDENDUM: Per CT technologist Summer Tanbouz the CT images placed on patient's record is a for a different patient. Physicians will be notified of results and images placed in the correct patient's record. Electronically Signed   By: Leita Birmingham M.D.   On: 06/11/2024 17:31   Addendum Date: 06/11/2024 ADDENDUM REPORT: 06/11/2024 16:01 ADDENDUM: Critical Value/emergent results were called by telephone at the time of interpretation on 06/11/2024 at 4:01 pm to provider LEITA CHANCY , who verbally acknowledged these results. Electronically Signed   By: Leita Birmingham M.D.   On: 06/11/2024 16:01   Result Date: 06/11/2024 CLINICAL DATA:  Left lower quadrant abdominal pain. Left flank pain starting yesterday. History of stage IV metastatic prostate cancer. EXAM: CT ABDOMEN AND PELVIS WITHOUT CONTRAST TECHNIQUE: Multidetector CT imaging of the abdomen and pelvis was performed following the standard protocol without IV contrast. RADIATION DOSE REDUCTION: This exam was performed according to the departmental dose-optimization program which includes automated exposure control, adjustment of the mA and/or kV according to patient size and/or use of iterative reconstruction technique. COMPARISON:  02/20/2024. FINDINGS: Lower chest: Multi-vessel coronary artery calcifications are noted. Mild atelectasis is present at the lung bases. Hepatobiliary: No focal liver abnormality is seen. No biliary ductal dilatation. Stones are seen within the gallbladder. Pancreas: Unremarkable. No pancreatic ductal dilatation or surrounding inflammatory changes. Spleen: Normal in size without focal abnormality. Adrenals/Urinary Tract: There is thickening of the adrenal glands bilaterally without evidence of discrete nodule., possible hyperplasia. A nonobstructive renal calculus is noted on the left. Arterial calcifications are also seen on the  left. No hydronephrosis bilaterally. There is mild thickening of the anterior bladder wall with associated inflammatory changes. Stomach/Bowel: The stomach is within normal limits. No bowel obstruction. There is pneumatosis involving a segment of small bowel the mid left abdomen, axial image 46 with associated foci of free air is in the left abdomen. There is a ventral abdominal wall hernia containing nonobstructed small bowel. The appendix is not well delineated. Vascular/Lymphatic: Aortic atherosclerosis. No enlarged abdominal or pelvic lymph nodes. Reproductive: Prostate gland is mildly enlarged. Other: No abdominopelvic ascites. Musculoskeletal: Degenerative changes are present in the thoracolumbar spine. Please see CT lumbar spine for additional information. IMPRESSION: 1. Segment of small  bowel in the the left abdomen with pneumatosis and associated foci of free air, uncertain ideology and perforation cannot be excluded. Surgical consultation is recommended. 2. Bladder wall thickening with associated inflammatory changes, possible infectious or inflammatory cystitis. 3. Low anterior abdominal wall hernia containing nonobstructed small bowel. 4. Nonobstructive left renal calculus. 5. Aortic atherosclerosis. Electronically Signed: By: Leita Birmingham M.D. On: 06/11/2024 15:57   DG Chest Port 1 View Result Date: 06/11/2024 CLINICAL DATA:  Questionable sepsis-evaluate for abnormality. EXAM: PORTABLE CHEST 1 VIEW COMPARISON:  Radiographs 02/21/2024 and 09/11/2011. Abdominal CT 06/11/2024. FINDINGS: 1625 hours. Low lung volumes with mildly increased atelectasis at both lung bases. The heart size and mediastinal contours are stable with aortic and brachycephalic tortuosity. There is no confluent airspace disease, pneumothorax or significant pleural effusion. Thoracolumbar scoliosis without evidence of acute osseous abnormality. IMPRESSION: Low lung volumes with mildly increased bibasilar atelectasis. No evidence of  pneumonia. Electronically Signed   By: Elsie Perone M.D.   On: 06/11/2024 16:57   CT L-SPINE NO CHARGE Result Date: 06/11/2024 CLINICAL DATA:  Left flank pain. Stage IV metastatic prostate cancer. EXAM: CT LUMBAR SPINE WITHOUT CONTRAST TECHNIQUE: Multidetector CT imaging of the lumbar spine was performed without intravenous contrast administration. Multiplanar CT image reconstructions were also generated. RADIATION DOSE REDUCTION: This exam was performed according to the departmental dose-optimization program which includes automated exposure control, adjustment of the mA and/or kV according to patient size and/or use of iterative reconstruction technique. COMPARISON:  None Available. FINDINGS: Segmentation: 5 lumbar type vertebrae. Alignment: Normal. Vertebrae: No acute fracture is seen. Osteopenia is noted. No focal lytic or destructive lesion is seen. Paraspinal and other soft tissues: No acute abnormality. Please see CT abdomen and pelvis for additional information. Disc levels: There is multilevel intervertebral disc space narrowing, degenerative endplate changes, disc herniations and facet arthropathy resulting in mild-to-moderate spinal canal and neural foraminal stenosis. There is severe spinal canal stenosis at L3-L4. IMPRESSION: 1. No acute fracture or lytic or destructive lesion. 2. Multilevel degenerative changes in the lumbar spine as described above. Electronically Signed   By: Leita Birmingham M.D.   On: 06/11/2024 16:05    I independently reviewed the above imaging studies.  Impression/Recommendation 89 yo M with hx of metastatic prostate cancer, now with bilateral hydronephrosis, obstructive uropathy likely due to extrinsic compression from prostate cancer progression  Discussed options with patient, including pros/cons of each approach. I recommended nephrostomy tubes - Typically in cases of extrinsic compression, much higher likelihood of success/renal function preservation. Patient  would like to discuss with his cousin and think on it. Will make him NPO at midnight in anticipation of intervention tomorrow.     Herlene Foot MD 06/12/2024, 2:55 PM  [facility]  Pager: [phone]

## 2024-06-12 NOTE — Consult Note (Deleted)
 Patient failed to show for virtual appointment today. This encounter was opened in error.  Please disregard.

## 2024-06-12 NOTE — Progress Notes (Signed)
 PROGRESS NOTE    Todd Holmes  FMW:[id number] DOB: 1928/10/09 DOA: 06/11/2024 PCP: Elliot Charm, MD    Brief Narrative:  89 year old with history of hypertension, metastatic prostate cancer presents to the emergency room with left flank pain, long history of dysuria, poor appetite.  In the emergency room hemodynamically stable.  He was found to have elevated creatinine from baseline.  Received 250 mL bolus fluid and admitted to the hospital.  Subjective: Patient seen in the morning rounds.  He still feels dry and dehydrated.  Denies any nausea vomiting.  Pain is mostly improved. He has Foley catheter placed last night, there is no urine in the Foley catheter. Given 2 L bolus now.  Creatinine continues to rise.  Blood pressure elevated.  Not on any blood pressure medications at home.  Assessment & Plan:   Acute kidney injury: Baseline creatinine about 1.17.  Presented with a creatinine of 3.19-4.25-4.48.  Potassium is normal.  No urine output overnight.  Currently no evidence of uremia.  Did not receive adequate resuscitation. Most of the symptoms consistent with  renal failure. CT scan with bilateral mild to moderate hydronephrosis, likely chronic.  There was no urine in the bladder. Reasonable to keep Foley catheter for urine output monitoring and decompression of the hydronephrosis. Strict intake and output monitoring. IV fluid bolus and monitor closely. Urinalysis was bland. Will discuss with nephrology for consultation.  Essential hypertension blood pressures elevated.  Will start patient on amlodipine  5 mg daily.  Monitor.  Will avoid sudden drop in blood pressure.  Metastatic prostate cancer: Symptomatic management.    DVT prophylaxis: heparin injection 5,000 Units Start: 06/11/24 2200   Code Status: DNR/DNI Family Communication: None at the bedside Disposition Plan: Status is: Observation The patient will require care spanning > 2 midnights and should be  moved to inpatient because: Significant worsening renal functions, needs inpatient treatment and monitoring     Consultants:  Nephrology  Procedures:  None  Antimicrobials:  None     Objective: Vitals:   06/11/24 2101 06/12/24 0208 06/12/24 0528 06/12/24 0950  BP:  (!) 175/90 (!) 182/84 (!) 188/88  Pulse:  69 74 70  Resp:  18 18 18   Temp:  98.8 F (37.1 C) 98.7 F (37.1 C) 97.9 F (36.6 C)  TempSrc:  Oral Oral Oral  SpO2:  97% 93% 97%  Weight: 52.8 kg     Height: 5' 1 (1.549 m)       Intake/Output Summary (Last 24 hours) at 06/12/2024 1120 Last data filed at 06/12/2024 0953 Gross per 24 hour  Intake 3272.1 ml  Output 65 ml  Net 3207.1 ml   Filed Weights   06/11/24 2101  Weight: 52.8 kg    Examination:  General exam: Appears calm and comfortable.  Pleasant interactive. Respiratory system: Clear to auscultation. Respiratory effort normal. Cardiovascular system: S1 & S2 heard, RRR. No JVD, murmurs, rubs, gallops or clicks. No pedal edema. Gastrointestinal system: Abdomen is nondistended, soft and nontender. No organomegaly or masses felt. Normal bowel sounds heard. Foley catheter with trace urine.  Looks clear. Central nervous system: Alert and oriented. No focal neurological deficits.  Generalized weakness. Extremities: Symmetric 5 x 5 power. Skin: No rashes, lesions or ulcers Psychiatry: Judgement and insight appear normal. Mood & affect appropriate.     Data Reviewed: I have personally reviewed following labs and imaging studies  CBC: Recent Labs  Lab 06/11/24 1233 06/12/24 0425  WBC 4.8 4.8  NEUTROABS 3.9  --  HGB 10.1* 8.8*  HCT 31.5* 28.1*  MCV 93.8 93.4  PLT 105* 92*   Basic Metabolic Panel: Recent Labs  Lab 06/11/24 1233 06/12/24 0425 06/12/24 0939  NA 141 140 140  K 3.7 3.7 3.8  CL 102 104 105  CO2 22 20* 19*  GLUCOSE 133* 83 101*  BUN 46* 53* 54*  CREATININE 3.19* 4.25* 4.48*  CALCIUM 9.2 8.5* 8.6*   GFR: Estimated  Creatinine Clearance: 7.6 mL/min (A) (by C-G formula based on SCr of 4.48 mg/dL (H)). Liver Function Tests: Recent Labs  Lab 06/11/24 1233  AST 38  ALT 10  ALKPHOS 67  BILITOT 0.9  PROT 7.4  ALBUMIN 4.1   No results for input(s): LIPASE, AMYLASE in the last 168 hours. No results for input(s): AMMONIA in the last 168 hours. Coagulation Profile: No results for input(s): INR, PROTIME in the last 168 hours. Cardiac Enzymes: No results for input(s): CKTOTAL, CKMB, CKMBINDEX, TROPONINI in the last 168 hours. BNP (last 3 results) No results for input(s): PROBNP in the last 8760 hours. HbA1C: No results for input(s): HGBA1C in the last 72 hours. CBG: No results for input(s): GLUCAP in the last 168 hours. Lipid Profile: No results for input(s): CHOL, HDL, LDLCALC, TRIG, CHOLHDL, LDLDIRECT in the last 72 hours. Thyroid  Function Tests: No results for input(s): TSH, T4TOTAL, FREET4, T3FREE, THYROIDAB in the last 72 hours. Anemia Panel: No results for input(s): VITAMINB12, FOLATE, FERRITIN, TIBC, IRON, RETICCTPCT in the last 72 hours. Sepsis Labs: No results for input(s): PROCALCITON, LATICACIDVEN in the last 168 hours.  No results found for this or any previous visit (from the past 240 hours).       Radiology Studies: CT L-SPINE NO CHARGE Result Date: 06/11/2024 CLINICAL DATA:  left flank pain starting yesterday. Sent here by UC after UA dip. Has stage four metastatic prostate cancer and has been losing weight. Denies any current urinary symptoms. EXAM: CT LUMBAR SPINE WITHOUT CONTRAST TECHNIQUE: Multidetector CT imaging of the lumbar spine was performed without intravenous contrast administration. Multiplanar CT image reconstructions were also generated. RADIATION DOSE REDUCTION: This exam was performed according to the departmental dose-optimization program which includes automated exposure control, adjustment of the mA  and/or kV according to patient size and/or use of iterative reconstruction technique. COMPARISON:  CT abdomen pelvis 02/20/2024 FINDINGS: Segmentation: 5 lumbar type vertebrae. Alignment: Similar-appearing levoscoliosis of the lumbar spine centered at the L3-L4 level. Vertebrae: Diffusely decreased bone density. Similar-appearing scattered blastic osseous metastasis of the lumbar spine. Redemonstration of lytic and blastic osseous lesions of the sacrum. No acute fracture or focal pathologic process. Paraspinal and other soft tissues: Negative. Disc levels: Multilevel intervertebral disc space narrowing and vacuum phenomenon. IMPRESSION: 1. Similar-appearing scattered blastic osseous metastasis of the lumbar spine. Redemonstration of lytic and blastic osseous lesions of the sacrum. 2. No acute displaced fracture or traumatic listhesis of the lumbar spine. 3. Diffusely decreased bone density. Electronically Signed   By: Morgane  Naveau M.D.   On: 06/11/2024 19:16   CT ABDOMEN PELVIS WO CONTRAST Result Date: 06/11/2024 CLINICAL DATA:  Abdominal pain, acute, nonlocalized left flank pain EXAM: CT ABDOMEN AND PELVIS WITHOUT CONTRAST TECHNIQUE: Multidetector CT imaging of the abdomen and pelvis was performed following the standard protocol without IV contrast. RADIATION DOSE REDUCTION: This exam was performed according to the departmental dose-optimization program which includes automated exposure control, adjustment of the mA and/or kV according to patient size and/or use of iterative reconstruction technique. COMPARISON:  CT abdomen pelvis 02/20/2024 FINDINGS:  Lower chest: Trace left pleural effusion.  Tiny hiatal hernia. Hepatobiliary: No focal liver abnormality. No gallstones, gallbladder wall thickening, or pericholecystic fluid. No biliary dilatation. Pancreas: Diffuse coarse calcifications. Diffusely atrophic. No focal lesion. Otherwise normal pancreatic contour. No surrounding inflammatory changes. No main  pancreatic ductal dilatation. ductal dilatation. Spleen: Normal in size without focal abnormality. Adrenals/Urinary Tract: No adrenal nodule bilaterally. Interval development of bilateral mild to moderate hydroureteronephrosis. No nephrolithiasis. No ureterolithiasis. The urinary bladder is unremarkable. Stomach/Bowel: Stomach is within normal limits. No evidence of bowel wall thickening or dilatation. Diffuse colonic diverticulosis. Appendix appears normal. Vascular/Lymphatic: Phleboliths noted along the pelvis. No abdominal aorta or iliac aneurysm. Moderate atherosclerotic plaque of the aorta and its branches. Difficult to measure on this noncontrast study persistent retroperitoneal and pelvic lymphadenopathy. Reproductive: Prostate is unremarkable with radiation seeds noted. Other: Interval development of diffuse mild mesenteric edema. Interval development of trace volume intraperitoneal free fluid. No intraperitoneal free gas. No organized fluid collection. Musculoskeletal: Status post left inguinal hernia repair with mesh. No recurrent hernia. No abdominal wall hernia or abnormality. Lytic and blastic metastatic lesions of the sacrum again noted. Please see separately dictated CT lumbar spine 06/11/2024. No acute displaced fracture. IMPRESSION: 1. Interval development of bilateral mild to moderate hydroureteronephrosis. No nephroureterolithiasis bilaterally. This may reflect the changes of obstructive uropathy or reflux. 2. Trace left pleural effusion. 3. Interval development of mild mesenteric edema and trace volume simple free fluid ascites. 4. Tiny hiatal hernia. 5. Chronic pancreatitis with no findings of acute pancreatitis. 6. Colonic diverticulosis with no acute diverticulitis. 7. Difficult to measure on this noncontrast study persistent retroperitoneal and pelvic lymphadenopathy. Persistent lytic and blastic metastatic lesions in a patient with known prostate cancer. Electronically Signed   By: Morgane   Naveau M.D.   On: 06/11/2024 19:10   CT ABDOMEN PELVIS WO CONTRAST Addendum Date: 06/11/2024 ADDENDUM REPORT: 06/11/2024 17:49 ADDENDUM: PA Leita Chancy was notified to disregard this report for Jakel Alphin due to incorrect images placed on patient's record by CT technologist. Critical Value/emergent results were called by telephone on 06/11/2024 at 5:43 pm to provider LEITA MATTER , who verbally acknowledged these results and is taking care of patient with images provided. New report will be generated within images are moved to the correct patient. Electronically Signed   By: Leita Birmingham M.D.   On: 06/11/2024 17:49   Addendum Date: 06/11/2024 ADDENDUM REPORT: 06/11/2024 17:31 ADDENDUM: Per CT technologist Summer Tanbouz the CT images placed on patient's record is a for a different patient. Physicians will be notified of results and images placed in the correct patient's record. Electronically Signed   By: Leita Birmingham M.D.   On: 06/11/2024 17:31   Addendum Date: 06/11/2024 ADDENDUM REPORT: 06/11/2024 16:01 ADDENDUM: Critical Value/emergent results were called by telephone at the time of interpretation on 06/11/2024 at 4:01 pm to provider LEITA CHANCY , who verbally acknowledged these results. Electronically Signed   By: Leita Birmingham M.D.   On: 06/11/2024 16:01   Result Date: 06/11/2024 CLINICAL DATA:  Left lower quadrant abdominal pain. Left flank pain starting yesterday. History of stage IV metastatic prostate cancer. EXAM: CT ABDOMEN AND PELVIS WITHOUT CONTRAST TECHNIQUE: Multidetector CT imaging of the abdomen and pelvis was performed following the standard protocol without IV contrast. RADIATION DOSE REDUCTION: This exam was performed according to the departmental dose-optimization program which includes automated exposure control, adjustment of the mA and/or kV according to patient size and/or use of iterative reconstruction technique. COMPARISON:  02/20/2024. FINDINGS:  Lower chest: Multi-vessel  coronary artery calcifications are noted. Mild atelectasis is present at the lung bases. Hepatobiliary: No focal liver abnormality is seen. No biliary ductal dilatation. Stones are seen within the gallbladder. Pancreas: Unremarkable. No pancreatic ductal dilatation or surrounding inflammatory changes. Spleen: Normal in size without focal abnormality. Adrenals/Urinary Tract: There is thickening of the adrenal glands bilaterally without evidence of discrete nodule., possible hyperplasia. A nonobstructive renal calculus is noted on the left. Arterial calcifications are also seen on the left. No hydronephrosis bilaterally. There is mild thickening of the anterior bladder wall with associated inflammatory changes. Stomach/Bowel: The stomach is within normal limits. No bowel obstruction. There is pneumatosis involving a segment of small bowel the mid left abdomen, axial image 46 with associated foci of free air is in the left abdomen. There is a ventral abdominal wall hernia containing nonobstructed small bowel. The appendix is not well delineated. Vascular/Lymphatic: Aortic atherosclerosis. No enlarged abdominal or pelvic lymph nodes. Reproductive: Prostate gland is mildly enlarged. Other: No abdominopelvic ascites. Musculoskeletal: Degenerative changes are present in the thoracolumbar spine. Please see CT lumbar spine for additional information. IMPRESSION: 1. Segment of small bowel in the the left abdomen with pneumatosis and associated foci of free air, uncertain ideology and perforation cannot be excluded. Surgical consultation is recommended. 2. Bladder wall thickening with associated inflammatory changes, possible infectious or inflammatory cystitis. 3. Low anterior abdominal wall hernia containing nonobstructed small bowel. 4. Nonobstructive left renal calculus. 5. Aortic atherosclerosis. Electronically Signed: By: Leita Birmingham M.D. On: 06/11/2024 15:57   DG Chest Port 1 View Result Date: 06/11/2024 CLINICAL  DATA:  Questionable sepsis-evaluate for abnormality. EXAM: PORTABLE CHEST 1 VIEW COMPARISON:  Radiographs 02/21/2024 and 09/11/2011. Abdominal CT 06/11/2024. FINDINGS: 1625 hours. Low lung volumes with mildly increased atelectasis at both lung bases. The heart size and mediastinal contours are stable with aortic and brachycephalic tortuosity. There is no confluent airspace disease, pneumothorax or significant pleural effusion. Thoracolumbar scoliosis without evidence of acute osseous abnormality. IMPRESSION: Low lung volumes with mildly increased bibasilar atelectasis. No evidence of pneumonia. Electronically Signed   By: Elsie Perone M.D.   On: 06/11/2024 16:57   CT L-SPINE NO CHARGE Result Date: 06/11/2024 CLINICAL DATA:  Left flank pain. Stage IV metastatic prostate cancer. EXAM: CT LUMBAR SPINE WITHOUT CONTRAST TECHNIQUE: Multidetector CT imaging of the lumbar spine was performed without intravenous contrast administration. Multiplanar CT image reconstructions were also generated. RADIATION DOSE REDUCTION: This exam was performed according to the departmental dose-optimization program which includes automated exposure control, adjustment of the mA and/or kV according to patient size and/or use of iterative reconstruction technique. COMPARISON:  None Available. FINDINGS: Segmentation: 5 lumbar type vertebrae. Alignment: Normal. Vertebrae: No acute fracture is seen. Osteopenia is noted. No focal lytic or destructive lesion is seen. Paraspinal and other soft tissues: No acute abnormality. Please see CT abdomen and pelvis for additional information. Disc levels: There is multilevel intervertebral disc space narrowing, degenerative endplate changes, disc herniations and facet arthropathy resulting in mild-to-moderate spinal canal and neural foraminal stenosis. There is severe spinal canal stenosis at L3-L4. IMPRESSION: 1. No acute fracture or lytic or destructive lesion. 2. Multilevel degenerative changes in  the lumbar spine as described above. Electronically Signed   By: Leita Birmingham M.D.   On: 06/11/2024 16:05        Scheduled Meds:  amLODipine   5 mg Oral Daily   Chlorhexidine  Gluconate Cloth  6 each Topical Daily   escitalopram   10 mg Oral Daily  heparin injection (subcutaneous)  5,000 Units Subcutaneous Q8H   mirabegron  ER  25 mg Oral Daily   Continuous Infusions:  sodium chloride  50 mL/hr at 06/12/24 0200     LOS: 0 days    Time spent: 55 minutes    Renato Applebaum, MD [facility]

## 2024-06-13 ENCOUNTER — Inpatient Hospital Stay (HOSPITAL_COMMUNITY)

## 2024-06-13 DIAGNOSIS — N179 Acute kidney failure, unspecified: Secondary | ICD-10-CM | POA: Diagnosis not present

## 2024-06-13 HISTORY — PX: IR NEPHROSTOMY PLACEMENT RIGHT: IMG6064

## 2024-06-13 LAB — PROTIME-INR
INR: 1.3 — ABNORMAL HIGH (ref 0.8–1.2)
Prothrombin Time: 17 s — ABNORMAL HIGH (ref 11.4–15.2)

## 2024-06-13 LAB — BASIC METABOLIC PANEL WITH GFR
Anion gap: 19 — ABNORMAL HIGH (ref 5–15)
Anion gap: 20 — ABNORMAL HIGH (ref 5–15)
BUN: 77 mg/dL — ABNORMAL HIGH (ref 8–23)
BUN: 71 mg/dL — ABNORMAL HIGH (ref 8–23)
CO2: 16 mmol/L — ABNORMAL LOW (ref 22–32)
CO2: 15 mmol/L — ABNORMAL LOW (ref 22–32)
Calcium: 8.5 mg/dL — ABNORMAL LOW (ref 8.9–10.3)
Calcium: 8.5 mg/dL — ABNORMAL LOW (ref 8.9–10.3)
Chloride: 106 mmol/L (ref 98–111)
Chloride: 108 mmol/L (ref 98–111)
Creatinine, Ser: 5.22 mg/dL — ABNORMAL HIGH (ref 0.61–1.24)
Creatinine, Ser: 5.29 mg/dL — ABNORMAL HIGH (ref 0.61–1.24)
GFR, Estimated: 10 mL/min — ABNORMAL LOW (ref >60)
GFR, Estimated: 10 mL/min — ABNORMAL LOW (ref >60)
Glucose, Bld: 146 mg/dL — ABNORMAL HIGH (ref 70–99)
Glucose, Bld: 143 mg/dL — ABNORMAL HIGH (ref 70–99)
Potassium: 4.2 mmol/L (ref 3.5–5.1)
Potassium: 4.1 mmol/L (ref 3.5–5.1)
Sodium: 142 mmol/L (ref 135–145)
Sodium: 143 mmol/L (ref 135–145)

## 2024-06-13 LAB — URINALYSIS, W/ REFLEX TO CULTURE (INFECTION SUSPECTED)

## 2024-06-13 LAB — GLUCOSE, CAPILLARY: Glucose-Capillary: 127 mg/dL — ABNORMAL HIGH (ref 70–99)

## 2024-06-13 MED ORDER — FENTANYL CITRATE (PF) 100 MCG/2ML IJ SOLN
INTRAMUSCULAR | Status: AC
  Filled 2024-06-13: qty 2

## 2024-06-13 MED ORDER — IOHEXOL 300 MG/ML  SOLN
50.0000 mL | Freq: Once | INTRAMUSCULAR | Status: AC | PRN
  Administered 2024-06-13: 20 mL

## 2024-06-13 MED ORDER — SODIUM CHLORIDE 0.9 % IV SOLN
1.0000 g | Freq: Once | INTRAVENOUS | Status: AC
  Administered 2024-06-13: 1 g via INTRAVENOUS
  Filled 2024-06-13 (×2): qty 10

## 2024-06-13 MED ORDER — FENTANYL CITRATE (PF) 100 MCG/2ML IJ SOLN
INTRAMUSCULAR | Status: AC | PRN
  Administered 2024-06-13: 25 ug via INTRAVENOUS

## 2024-06-13 MED ORDER — ONDANSETRON HCL 4 MG/2ML IJ SOLN
INTRAMUSCULAR | Status: AC | PRN
  Administered 2024-06-13: 4 mg via INTRAVENOUS

## 2024-06-13 MED ORDER — SODIUM CHLORIDE 0.9% FLUSH
5.0000 mL | Freq: Three times a day (TID) | INTRAVENOUS | Status: DC
  Administered 2024-06-13 – 2024-06-19 (×17): 5 mL

## 2024-06-13 MED ORDER — MIDAZOLAM HCL 2 MG/2ML IJ SOLN
INTRAMUSCULAR | Status: AC | PRN
  Administered 2024-06-13: .5 mg via INTRAVENOUS

## 2024-06-13 MED ORDER — ONDANSETRON HCL 4 MG/2ML IJ SOLN
INTRAMUSCULAR | Status: AC
  Filled 2024-06-13: qty 2

## 2024-06-13 MED ORDER — MIDAZOLAM HCL 2 MG/2ML IJ SOLN
INTRAMUSCULAR | Status: AC
  Filled 2024-06-13: qty 2

## 2024-06-13 MED ORDER — LIDOCAINE-EPINEPHRINE 1 %-1:100000 IJ SOLN
INTRAMUSCULAR | Status: AC
  Filled 2024-06-13: qty 1

## 2024-06-13 MED ORDER — HEPARIN SODIUM (PORCINE) 5000 UNIT/ML IJ SOLN
5000.0000 [IU] | Freq: Three times a day (TID) | INTRAMUSCULAR | Status: DC
  Administered 2024-06-14 – 2024-06-19 (×16): 5000 [IU] via SUBCUTANEOUS
  Filled 2024-06-13 (×16): qty 1

## 2024-06-13 MED ORDER — LIDOCAINE-EPINEPHRINE 1 %-1:100000 IJ SOLN
20.0000 mL | Freq: Once | INTRAMUSCULAR | Status: AC
  Administered 2024-06-13: 20 mL via INTRADERMAL

## 2024-06-13 NOTE — Procedures (Signed)
 Vascular and Interventional Radiology Procedure Note  Patient: Todd Holmes DOB: [birth year] Medical Record Number: [id number] Note Date/Time: 06/13/24 10:53 AM   Performing Physician: Thom Hall, MD Assistant(s): None  Diagnosis: Hx prostate CA w obstructing hydronephrosis  Procedure:  BILATERAL NEPHROSTOMY TUBE PLACEMENT BILATERAL ANTEROGRADE NEPHROSTOGRAM  Anesthesia: Conscious Sedation Complications: None Estimated Blood Loss: Minimal Specimens:  None  Findings:  Successful placement of a 10 F nephrostomy tube into the kidney(s).   Plan: Flush PCN w 10 mL and record drain output qShift. Follow up for routine nephrostomy tube exchange in 8 week(s).   See detailed procedure note with images in PACS. The patient tolerated the procedure well without incident or complication and was returned to Recovery in stable condition.    Thom Hall, MD [facility] Radiology   Pager. [phone] Clinic. [phone]

## 2024-06-13 NOTE — Progress Notes (Signed)
 PROGRESS NOTE    Todd Holmes  FMW:[id number] DOB: [birth year] DOA: 06/11/2024 PCP: Elliot Charm, MD    Brief Narrative:  89 year old with history of hypertension, metastatic prostate cancer presents to the emergency room with left flank pain, long history of dysuria, poor appetite.  In the emergency room hemodynamically stable.  He was found to have elevated creatinine from baseline.  Received 250 mL bolus fluid and admitted to the hospital.  Subjective: Patient seen and examined.  Continues to have intermittent nausea.  No urine output so far from last 24 hours.  Patient is going for nephrostomy tube and he understands the concept.  He has agreed for the procedure.  Assessment & Plan:   Acute kidney injury: Baseline creatinine about 1.17.  Presented with a creatinine of 3.19-4.25-4.48-5.24. Potassium is normal.  Currently no indication for urgent hemodialysis. Continues to have nausea probably due to uremia. Adequate symptom control.  Low-dose maintenance IV fluids. Continue Foley catheter today for decompression. Bilateral nephrostomy tube today by IR, urology also following. Continue close monitoring. Nephrology following.  Essential hypertension blood pressures elevated.  Started on amlodipine .  Also using hydralazine.  Will add scheduled hydralazine along with amlodipine .  As needed blood pressure medications.  Avoid sudden drop.   Metastatic prostate cancer: Symptomatic management.    DVT prophylaxis: heparin injection 5,000 Units Start: 06/14/24 0600   Code Status: DNR/DNI Family Communication: None at the bedside.  Surgery communicated with the family. Disposition Plan: Status is: Inpatient.  Inpatient procedures.   Consultants:  Nephrology Urology Interventional radiology  Procedures:  Scheduled for bilateral nephrostomy tube today.  Antimicrobials:  None     Objective: Vitals:   06/12/24 1559 06/12/24 1800 06/12/24 2132 06/13/24 0559  BP:   (!) 181/93 (!) 186/102 (!) 185/99  Pulse:   93 90  Resp:   19 20  Temp:   98.1 F (36.7 C) 97.9 F (36.6 C)  TempSrc:   Oral Oral  SpO2:   95% 95%  Weight: 59.8 kg     Height:        Intake/Output Summary (Last 24 hours) at 06/13/2024 1248 Last data filed at 06/13/2024 1000 Gross per 24 hour  Intake 1677.17 ml  Output 150 ml  Net 1527.17 ml   Filed Weights   06/11/24 2101 06/12/24 1559  Weight: 52.8 kg 59.8 kg    Examination:  General exam: Appears calm and comfortable.  Slightly anxious today. Respiratory system: Clear to auscultation. Respiratory effort normal. Cardiovascular system: S1 & S2 heard, RRR. No JVD, murmurs, rubs, gallops or clicks. No pedal edema. Gastrointestinal system: Abdomen is nondistended, soft and nontender. No organomegaly or masses felt. Normal bowel sounds heard. Foley catheter with trace urine.  Looks clear. Central nervous system: Alert and oriented. No focal neurological deficits.  Generalized weakness. Extremities: Symmetric 5 x 5 power. Skin: No rashes, lesions or ulcers Psychiatry: Judgement and insight appear normal. Mood & affect appropriate.     Data Reviewed: I have personally reviewed following labs and imaging studies  CBC: Recent Labs  Lab 06/11/24 1233 06/12/24 0425  WBC 4.8 4.8  NEUTROABS 3.9  --   HGB 10.1* 8.8*  HCT 31.5* 28.1*  MCV 93.8 93.4  PLT 105* 92*   Basic Metabolic Panel: Recent Labs  Lab 06/11/24 1233 06/12/24 0425 06/12/24 0939 06/12/24 1709 06/13/24 0510  NA 141 140 140 142 142  K 3.7 3.7 3.8 3.9 4.2  CL 102 104 105 104 106  CO2 22 20* 19* 17*  16*  GLUCOSE 133* 83 101* 157* 146*  BUN 46* 53* 54* 57* 77*  CREATININE 3.19* 4.25* 4.48* 4.58* 5.22*  CALCIUM 9.2 8.5* 8.6* 8.3* 8.5*   GFR: Estimated Creatinine Clearance: 6.5 mL/min (A) (by C-G formula based on SCr of 5.22 mg/dL (H)). Liver Function Tests: Recent Labs  Lab 06/11/24 1233  AST 38  ALT 10  ALKPHOS 67  BILITOT 0.9  PROT 7.4   ALBUMIN 4.1   No results for input(s): LIPASE, AMYLASE in the last 168 hours. No results for input(s): AMMONIA in the last 168 hours. Coagulation Profile: Recent Labs  Lab 06/13/24 0905  INR 1.3*   Cardiac Enzymes: No results for input(s): CKTOTAL, CKMB, CKMBINDEX, TROPONINI in the last 168 hours. BNP (last 3 results) No results for input(s): PROBNP in the last 8760 hours. HbA1C: No results for input(s): HGBA1C in the last 72 hours. CBG: Recent Labs  Lab 06/12/24 2135  GLUCAP 173*   Lipid Profile: No results for input(s): CHOL, HDL, LDLCALC, TRIG, CHOLHDL, LDLDIRECT in the last 72 hours. Thyroid  Function Tests: No results for input(s): TSH, T4TOTAL, FREET4, T3FREE, THYROIDAB in the last 72 hours. Anemia Panel: No results for input(s): VITAMINB12, FOLATE, FERRITIN, TIBC, IRON, RETICCTPCT in the last 72 hours. Sepsis Labs: No results for input(s): PROCALCITON, LATICACIDVEN in the last 168 hours.  No results found for this or any previous visit (from the past 240 hours).       Radiology Studies: CT L-SPINE NO CHARGE Result Date: 06/11/2024 CLINICAL DATA:  left flank pain starting yesterday. Sent here by UC after UA dip. Has stage four metastatic prostate cancer and has been losing weight. Denies any current urinary symptoms. EXAM: CT LUMBAR SPINE WITHOUT CONTRAST TECHNIQUE: Multidetector CT imaging of the lumbar spine was performed without intravenous contrast administration. Multiplanar CT image reconstructions were also generated. RADIATION DOSE REDUCTION: This exam was performed according to the departmental dose-optimization program which includes automated exposure control, adjustment of the mA and/or kV according to patient size and/or use of iterative reconstruction technique. COMPARISON:  CT abdomen pelvis 02/20/2024 FINDINGS: Segmentation: 5 lumbar type vertebrae. Alignment: Similar-appearing levoscoliosis of the  lumbar spine centered at the L3-L4 level. Vertebrae: Diffusely decreased bone density. Similar-appearing scattered blastic osseous metastasis of the lumbar spine. Redemonstration of lytic and blastic osseous lesions of the sacrum. No acute fracture or focal pathologic process. Paraspinal and other soft tissues: Negative. Disc levels: Multilevel intervertebral disc space narrowing and vacuum phenomenon. IMPRESSION: 1. Similar-appearing scattered blastic osseous metastasis of the lumbar spine. Redemonstration of lytic and blastic osseous lesions of the sacrum. 2. No acute displaced fracture or traumatic listhesis of the lumbar spine. 3. Diffusely decreased bone density. Electronically Signed   By: Morgane  Naveau M.D.   On: 06/11/2024 19:16   CT ABDOMEN PELVIS WO CONTRAST Result Date: 06/11/2024 CLINICAL DATA:  Abdominal pain, acute, nonlocalized left flank pain EXAM: CT ABDOMEN AND PELVIS WITHOUT CONTRAST TECHNIQUE: Multidetector CT imaging of the abdomen and pelvis was performed following the standard protocol without IV contrast. RADIATION DOSE REDUCTION: This exam was performed according to the departmental dose-optimization program which includes automated exposure control, adjustment of the mA and/or kV according to patient size and/or use of iterative reconstruction technique. COMPARISON:  CT abdomen pelvis 02/20/2024 FINDINGS: Lower chest: Trace left pleural effusion.  Tiny hiatal hernia. Hepatobiliary: No focal liver abnormality. No gallstones, gallbladder wall thickening, or pericholecystic fluid. No biliary dilatation. Pancreas: Diffuse coarse calcifications. Diffusely atrophic. No focal lesion. Otherwise normal pancreatic contour. No  surrounding inflammatory changes. No main pancreatic ductal dilatation. ductal dilatation. Spleen: Normal in size without focal abnormality. Adrenals/Urinary Tract: No adrenal nodule bilaterally. Interval development of bilateral mild to moderate hydroureteronephrosis. No  nephrolithiasis. No ureterolithiasis. The urinary bladder is unremarkable. Stomach/Bowel: Stomach is within normal limits. No evidence of bowel wall thickening or dilatation. Diffuse colonic diverticulosis. Appendix appears normal. Vascular/Lymphatic: Phleboliths noted along the pelvis. No abdominal aorta or iliac aneurysm. Moderate atherosclerotic plaque of the aorta and its branches. Difficult to measure on this noncontrast study persistent retroperitoneal and pelvic lymphadenopathy. Reproductive: Prostate is unremarkable with radiation seeds noted. Other: Interval development of diffuse mild mesenteric edema. Interval development of trace volume intraperitoneal free fluid. No intraperitoneal free gas. No organized fluid collection. Musculoskeletal: Status post left inguinal hernia repair with mesh. No recurrent hernia. No abdominal wall hernia or abnormality. Lytic and blastic metastatic lesions of the sacrum again noted. Please see separately dictated CT lumbar spine 06/11/2024. No acute displaced fracture. IMPRESSION: 1. Interval development of bilateral mild to moderate hydroureteronephrosis. No nephroureterolithiasis bilaterally. This may reflect the changes of obstructive uropathy or reflux. 2. Trace left pleural effusion. 3. Interval development of mild mesenteric edema and trace volume simple free fluid ascites. 4. Tiny hiatal hernia. 5. Chronic pancreatitis with no findings of acute pancreatitis. 6. Colonic diverticulosis with no acute diverticulitis. 7. Difficult to measure on this noncontrast study persistent retroperitoneal and pelvic lymphadenopathy. Persistent lytic and blastic metastatic lesions in a patient with known prostate cancer. Electronically Signed   By: Morgane  Naveau M.D.   On: 06/11/2024 19:10   CT ABDOMEN PELVIS WO CONTRAST Addendum Date: 06/11/2024 ADDENDUM REPORT: 06/11/2024 17:49 ADDENDUM: PA Leita Chancy was notified to disregard this report for Tarun Patchell due to incorrect  images placed on patient's record by CT technologist. Critical Value/emergent results were called by telephone on 06/11/2024 at 5:43 pm to provider LEITA MATTER , who verbally acknowledged these results and is taking care of patient with images provided. New report will be generated within images are moved to the correct patient. Electronically Signed   By: Leita Birmingham M.D.   On: 06/11/2024 17:49   Addendum Date: 06/11/2024 ADDENDUM REPORT: 06/11/2024 17:31 ADDENDUM: Per CT technologist Summer Tanbouz the CT images placed on patient's record is a for a different patient. Physicians will be notified of results and images placed in the correct patient's record. Electronically Signed   By: Leita Birmingham M.D.   On: 06/11/2024 17:31   Addendum Date: 06/11/2024 ADDENDUM REPORT: 06/11/2024 16:01 ADDENDUM: Critical Value/emergent results were called by telephone at the time of interpretation on 06/11/2024 at 4:01 pm to provider LEITA CHANCY , who verbally acknowledged these results. Electronically Signed   By: Leita Birmingham M.D.   On: 06/11/2024 16:01   Result Date: 06/11/2024 CLINICAL DATA:  Left lower quadrant abdominal pain. Left flank pain starting yesterday. History of stage IV metastatic prostate cancer. EXAM: CT ABDOMEN AND PELVIS WITHOUT CONTRAST TECHNIQUE: Multidetector CT imaging of the abdomen and pelvis was performed following the standard protocol without IV contrast. RADIATION DOSE REDUCTION: This exam was performed according to the departmental dose-optimization program which includes automated exposure control, adjustment of the mA and/or kV according to patient size and/or use of iterative reconstruction technique. COMPARISON:  02/20/2024. FINDINGS: Lower chest: Multi-vessel coronary artery calcifications are noted. Mild atelectasis is present at the lung bases. Hepatobiliary: No focal liver abnormality is seen. No biliary ductal dilatation. Stones are seen within the gallbladder. Pancreas:  Unremarkable. No pancreatic ductal dilatation  or surrounding inflammatory changes. Spleen: Normal in size without focal abnormality. Adrenals/Urinary Tract: There is thickening of the adrenal glands bilaterally without evidence of discrete nodule., possible hyperplasia. A nonobstructive renal calculus is noted on the left. Arterial calcifications are also seen on the left. No hydronephrosis bilaterally. There is mild thickening of the anterior bladder wall with associated inflammatory changes. Stomach/Bowel: The stomach is within normal limits. No bowel obstruction. There is pneumatosis involving a segment of small bowel the mid left abdomen, axial image 46 with associated foci of free air is in the left abdomen. There is a ventral abdominal wall hernia containing nonobstructed small bowel. The appendix is not well delineated. Vascular/Lymphatic: Aortic atherosclerosis. No enlarged abdominal or pelvic lymph nodes. Reproductive: Prostate gland is mildly enlarged. Other: No abdominopelvic ascites. Musculoskeletal: Degenerative changes are present in the thoracolumbar spine. Please see CT lumbar spine for additional information. IMPRESSION: 1. Segment of small bowel in the the left abdomen with pneumatosis and associated foci of free air, uncertain ideology and perforation cannot be excluded. Surgical consultation is recommended. 2. Bladder wall thickening with associated inflammatory changes, possible infectious or inflammatory cystitis. 3. Low anterior abdominal wall hernia containing nonobstructed small bowel. 4. Nonobstructive left renal calculus. 5. Aortic atherosclerosis. Electronically Signed: By: Leita Birmingham M.D. On: 06/11/2024 15:57   DG Chest Port 1 View Result Date: 06/11/2024 CLINICAL DATA:  Questionable sepsis-evaluate for abnormality. EXAM: PORTABLE CHEST 1 VIEW COMPARISON:  Radiographs 02/21/2024 and 09/11/2011. Abdominal CT 06/11/2024. FINDINGS: 1625 hours. Low lung volumes with mildly increased  atelectasis at both lung bases. The heart size and mediastinal contours are stable with aortic and brachycephalic tortuosity. There is no confluent airspace disease, pneumothorax or significant pleural effusion. Thoracolumbar scoliosis without evidence of acute osseous abnormality. IMPRESSION: Low lung volumes with mildly increased bibasilar atelectasis. No evidence of pneumonia. Electronically Signed   By: Elsie Perone M.D.   On: 06/11/2024 16:57   CT L-SPINE NO CHARGE Result Date: 06/11/2024 CLINICAL DATA:  Left flank pain. Stage IV metastatic prostate cancer. EXAM: CT LUMBAR SPINE WITHOUT CONTRAST TECHNIQUE: Multidetector CT imaging of the lumbar spine was performed without intravenous contrast administration. Multiplanar CT image reconstructions were also generated. RADIATION DOSE REDUCTION: This exam was performed according to the departmental dose-optimization program which includes automated exposure control, adjustment of the mA and/or kV according to patient size and/or use of iterative reconstruction technique. COMPARISON:  None Available. FINDINGS: Segmentation: 5 lumbar type vertebrae. Alignment: Normal. Vertebrae: No acute fracture is seen. Osteopenia is noted. No focal lytic or destructive lesion is seen. Paraspinal and other soft tissues: No acute abnormality. Please see CT abdomen and pelvis for additional information. Disc levels: There is multilevel intervertebral disc space narrowing, degenerative endplate changes, disc herniations and facet arthropathy resulting in mild-to-moderate spinal canal and neural foraminal stenosis. There is severe spinal canal stenosis at L3-L4. IMPRESSION: 1. No acute fracture or lytic or destructive lesion. 2. Multilevel degenerative changes in the lumbar spine as described above. Electronically Signed   By: Leita Birmingham M.D.   On: 06/11/2024 16:05        Scheduled Meds:  amLODipine   5 mg Oral Daily   Chlorhexidine  Gluconate Cloth  6 each Topical Daily    escitalopram   10 mg Oral Daily   [START ON 06/14/2024] heparin injection (subcutaneous)  5,000 Units Subcutaneous Q8H   mirabegron  ER  25 mg Oral Daily   Continuous Infusions:  sodium chloride  50 mL/hr at 06/13/24 0353   cefTRIAXone (ROCEPHIN)  IV  promethazine (PHENERGAN) injection (IM or IVPB) 12.5 mg (06/12/24 1337)     LOS: 1 day    Time spent: 55 minutes    Renato Applebaum, MD [facility]

## 2024-06-13 NOTE — Progress Notes (Signed)
 Subjective: First time meeting Todd Holmes this morning.  He was up in chair.  Reviewed case and plan.  No acute events overnight.  Ongoing nausea most likely 2/2 obstructive uropathy.  Objective: Vital signs in last 24 hours: Temp:  [97.5 F (36.4 C)-98.1 F (36.7 C)] 97.9 F (36.6 C) (09/30 0559) Pulse Rate:  [70-93] 90 (09/30 0559) Resp:  [18-20] 20 (09/30 0559) BP: (181-192)/(88-102) 185/99 (09/30 0559) SpO2:  [94 %-97 %] 95 % (09/30 0559) Weight:  [59.8 kg] 59.8 kg (09/29 1559)  Assessment/Plan: # Stage IV prostate cancer # Bilateral ureteral obstruction w/ hydronephrosis # ARF # Nausea  Trend labs.  Baseline renal function averages around 1.0, SCr 5.22 as of this morning.  No improvement following Foley catheter placement.  Nausea most likely 2/2 renal obstruction and BUN elevation.  This should resolve following renal decompression.  Continue as needed antiemetics.  Patient wishes to proceed with bilateral percutaneous nephrostomy tubes.  These will likely be a long-term palliation with scheduled exchanges.  He has failed multiple treatments. Remove foley after placement unless significant lower tract UOP is recorded.   Urology will follow peripherally. Follow up outpt as needed.  Intake/Output from previous day: 09/29 0701 - 09/30 0700 In: 1570.6 [P.O.:130; I.V.:1388.3; IV Piggyback:52.3] Out: 150 [Urine:150]  Intake/Output this shift: No intake/output data recorded.  Physical Exam:  General: Alert and oriented CV: No cyanosis Lungs: equal chest rise Abdomen: Soft, NTND, no rebound or guarding Gu: Foley catheter in place with no output  Lab Holmes: Recent Labs    06/11/24 1233 06/12/24 0425  HGB 10.1* 8.8*  HCT 31.5* 28.1*   BMET Recent Labs    06/11/24 1233 06/12/24 0425 06/12/24 0939 06/12/24 1709 06/13/24 0510  NA 141 140   < > 142 142  K 3.7 3.7   < > 3.9 4.2  CL 102 104   < > 104 106  CO2 22 20*   < > 17* 16*  GLUCOSE 133* 83   < >  157* 146*  BUN 46* 53*   < > 57* 77*  CREATININE 3.19* 4.25*   < > 4.58* 5.22*  CALCIUM 9.2 8.5*   < > 8.3* 8.5*  HGB 10.1* 8.8*  --   --   --   WBC 4.8 4.8  --   --   --    < > = values in this interval not displayed.     Studies/Holmes: CT L-SPINE NO CHARGE Result Date: 06/11/2024 CLINICAL DATA:  left flank pain starting yesterday. Sent here by UC after UA dip. Has stage four metastatic prostate cancer and has been losing weight. Denies any current urinary symptoms. EXAM: CT LUMBAR SPINE WITHOUT CONTRAST TECHNIQUE: Multidetector CT imaging of the lumbar spine was performed without intravenous contrast administration. Multiplanar CT image reconstructions were also generated. RADIATION DOSE REDUCTION: This exam was performed according to the departmental dose-optimization program which includes automated exposure control, adjustment of the mA and/or kV according to patient size and/or use of iterative reconstruction technique. COMPARISON:  CT abdomen pelvis 02/20/2024 FINDINGS: Segmentation: 5 lumbar type vertebrae. Alignment: Similar-appearing levoscoliosis of the lumbar spine centered at the L3-L4 level. Vertebrae: Diffusely decreased bone density. Similar-appearing scattered blastic osseous metastasis of the lumbar spine. Redemonstration of lytic and blastic osseous lesions of the sacrum. No acute fracture or focal pathologic process. Paraspinal and other soft tissues: Negative. Disc levels: Multilevel intervertebral disc space narrowing and vacuum phenomenon. IMPRESSION: 1. Similar-appearing scattered blastic osseous metastasis of  the lumbar spine. Redemonstration of lytic and blastic osseous lesions of the sacrum. 2. No acute displaced fracture or traumatic listhesis of the lumbar spine. 3. Diffusely decreased bone density. Electronically Signed   By: Morgane  Naveau M.D.   On: 06/11/2024 19:16   CT ABDOMEN PELVIS WO CONTRAST Result Date: 06/11/2024 CLINICAL DATA:  Abdominal pain, acute,  nonlocalized left flank pain EXAM: CT ABDOMEN AND PELVIS WITHOUT CONTRAST TECHNIQUE: Multidetector CT imaging of the abdomen and pelvis was performed following the standard protocol without IV contrast. RADIATION DOSE REDUCTION: This exam was performed according to the departmental dose-optimization program which includes automated exposure control, adjustment of the mA and/or kV according to patient size and/or use of iterative reconstruction technique. COMPARISON:  CT abdomen pelvis 02/20/2024 FINDINGS: Lower chest: Trace left pleural effusion.  Tiny hiatal hernia. Hepatobiliary: No focal liver abnormality. No gallstones, gallbladder wall thickening, or pericholecystic fluid. No biliary dilatation. Pancreas: Diffuse coarse calcifications. Diffusely atrophic. No focal lesion. Otherwise normal pancreatic contour. No surrounding inflammatory changes. No main pancreatic ductal dilatation. ductal dilatation. Spleen: Normal in size without focal abnormality. Adrenals/Urinary Tract: No adrenal nodule bilaterally. Interval development of bilateral mild to moderate hydroureteronephrosis. No nephrolithiasis. No ureterolithiasis. The urinary bladder is unremarkable. Stomach/Bowel: Stomach is within normal limits. No evidence of bowel wall thickening or dilatation. Diffuse colonic diverticulosis. Appendix appears normal. Vascular/Lymphatic: Phleboliths noted along the pelvis. No abdominal aorta or iliac aneurysm. Moderate atherosclerotic plaque of the aorta and its branches. Difficult to measure on this noncontrast study persistent retroperitoneal and pelvic lymphadenopathy. Reproductive: Prostate is unremarkable with radiation seeds noted. Other: Interval development of diffuse mild mesenteric edema. Interval development of trace volume intraperitoneal free fluid. No intraperitoneal free gas. No organized fluid collection. Musculoskeletal: Status post left inguinal hernia repair with mesh. No recurrent hernia. No abdominal  wall hernia or abnormality. Lytic and blastic metastatic lesions of the sacrum again noted. Please see separately dictated CT lumbar spine 06/11/2024. No acute displaced fracture. IMPRESSION: 1. Interval development of bilateral mild to moderate hydroureteronephrosis. No nephroureterolithiasis bilaterally. This may reflect the changes of obstructive uropathy or reflux. 2. Trace left pleural effusion. 3. Interval development of mild mesenteric edema and trace volume simple free fluid ascites. 4. Tiny hiatal hernia. 5. Chronic pancreatitis with no findings of acute pancreatitis. 6. Colonic diverticulosis with no acute diverticulitis. 7. Difficult to measure on this noncontrast study persistent retroperitoneal and pelvic lymphadenopathy. Persistent lytic and blastic metastatic lesions in a patient with known prostate cancer. Electronically Signed   By: Morgane  Naveau M.D.   On: 06/11/2024 19:10   CT ABDOMEN PELVIS WO CONTRAST Addendum Date: 06/11/2024 ADDENDUM REPORT: 06/11/2024 17:49 ADDENDUM: PA Leita Chancy was notified to disregard this report for Todd Holmes , Todd Holmes and is taking care of patient with images provided. New report will be generated within images are moved to the correct patient. Electronically Signed   By: Leita Birmingham M.D.   On: 06/11/2024 17:49   Addendum Date: 06/11/2024 ADDENDUM REPORT: 06/11/2024 17:31 ADDENDUM: Per CT technologist Summer Tanbouz the CT images placed on patient's record is a for a different patient. Physicians will be notified of Holmes and images placed in the correct patient's record. Electronically Signed   By: Leita Birmingham M.D.   On: 06/11/2024 17:31   Addendum Date: 06/11/2024 ADDENDUM REPORT: 06/11/2024 16:01  ADDENDUM: Critical Value/emergent Holmes  were called by telephone at the time of interpretation on 06/11/2024 at 4:01 pm to provider [facility] , Todd Holmes. Electronically Signed   By: Leita Birmingham M.D.   On: 06/11/2024 16:01   Result Date: 06/11/2024 CLINICAL DATA:  Left lower quadrant abdominal pain. Left flank pain starting yesterday. History of stage IV metastatic prostate cancer. EXAM: CT ABDOMEN AND PELVIS WITHOUT CONTRAST TECHNIQUE: Multidetector CT imaging of the abdomen and pelvis was performed following the standard protocol without IV contrast. RADIATION DOSE REDUCTION: This exam was performed according to the departmental dose-optimization program which includes automated exposure control, adjustment of the mA and/or kV according to patient size and/or use of iterative reconstruction technique. COMPARISON:  02/20/2024. FINDINGS: Lower chest: Multi-vessel coronary artery calcifications are noted. Mild atelectasis is present at the lung bases. Hepatobiliary: No focal liver abnormality is seen. No biliary ductal dilatation. Stones are seen within the gallbladder. Pancreas: Unremarkable. No pancreatic ductal dilatation or surrounding inflammatory changes. Spleen: Normal in size without focal abnormality. Adrenals/Urinary Tract: There is thickening of the adrenal glands bilaterally without evidence of discrete nodule., possible hyperplasia. A nonobstructive renal calculus is noted on the left. Arterial calcifications are also seen on the left. No hydronephrosis bilaterally. There is mild thickening of the anterior bladder wall with associated inflammatory changes. Stomach/Bowel: The stomach is within normal limits. No bowel obstruction. There is pneumatosis involving a segment of small bowel the mid left abdomen, axial image 46 with associated foci of free air is in the left abdomen. There is a ventral abdominal wall hernia containing nonobstructed small bowel. The appendix is not well delineated.  Vascular/Lymphatic: Aortic atherosclerosis. No enlarged abdominal or pelvic lymph nodes. Reproductive: Prostate gland is mildly enlarged. Other: No abdominopelvic ascites. Musculoskeletal: Degenerative changes are present in the thoracolumbar spine. Please see CT lumbar spine for additional information. IMPRESSION: 1. Segment of small bowel in the the left abdomen with pneumatosis and associated foci of free air, uncertain ideology and perforation cannot be excluded. Surgical consultation is recommended. 2. Bladder wall thickening with associated inflammatory changes, possible infectious or inflammatory cystitis. 3. Low anterior abdominal wall hernia containing nonobstructed small bowel. 4. Nonobstructive left renal calculus. 5. Aortic atherosclerosis. Electronically Signed: By: Leita Birmingham M.D. On: 06/11/2024 15:57   DG Chest Port 1 View Result Date: 06/11/2024 CLINICAL DATA:  Questionable sepsis-evaluate for abnormality. EXAM: PORTABLE CHEST 1 VIEW COMPARISON:  Radiographs 02/21/2024 and 09/11/2011. Abdominal CT 06/11/2024. FINDINGS: 1625 hours. Low lung volumes with mildly increased atelectasis at both lung bases. The heart size and mediastinal contours are stable with aortic and brachycephalic tortuosity. There is no confluent airspace disease, pneumothorax or significant pleural effusion. Thoracolumbar scoliosis without evidence of acute osseous abnormality. IMPRESSION: Low lung volumes with mildly increased bibasilar atelectasis. No evidence of pneumonia. Electronically Signed   By: Elsie Perone M.D.   On: 06/11/2024 16:57   CT L-SPINE NO CHARGE Result Date: 06/11/2024 CLINICAL DATA:  Left flank pain. Stage IV metastatic prostate cancer. EXAM: CT LUMBAR SPINE WITHOUT CONTRAST TECHNIQUE: Multidetector CT imaging of the lumbar spine was performed without intravenous contrast administration. Multiplanar CT image reconstructions were also generated. RADIATION DOSE REDUCTION: This exam was performed  according to the departmental dose-optimization program which includes automated exposure control, adjustment of the mA and/or kV according to patient size and/or use of iterative reconstruction technique. COMPARISON:  None Available. FINDINGS: Segmentation: 5 lumbar type vertebrae. Alignment: Normal. Vertebrae: No acute fracture is seen. Osteopenia is  noted. No focal lytic or destructive lesion is seen. Paraspinal and other soft tissues: No acute abnormality. Please see CT abdomen and pelvis for additional information. Disc levels: There is multilevel intervertebral disc space narrowing, degenerative endplate changes, disc herniations and facet arthropathy resulting in mild-to-moderate spinal canal and neural foraminal stenosis. There is severe spinal canal stenosis at L3-L4. IMPRESSION: 1. No acute fracture or lytic or destructive lesion. 2. Multilevel degenerative changes in the lumbar spine as described above. Electronically Signed   By: Leita Birmingham M.D.   On: 06/11/2024 16:05      LOS: 1 day   Ole Bourdon, NP [facility] Pager: [phone]  06/13/2024, 8:38 AM

## 2024-06-13 NOTE — Evaluation (Signed)
 Physical Therapy Evaluation Patient Details Name: Todd Holmes MRN: [id number] DOB: 11-07-1928 Today's Date: 06/13/2024  History of Present Illness  Todd Holmes is a 76 male admitted due to AKI. PMH: HTN, prostate cancer, anemia, arthritis, memory disorder, tremor, hernia repair  Clinical Impression  Pt admitted with above diagnosis.  Per chart pt is mod I and amb with SPC at baseline. Pt is lethargic, oriented to self at time of PT eval. Pt overall min to mod assist for basic functional tasks.  Patient may benefit from continued inpatient follow up therapy, <3 hours/day--vs HHPT pending progress and available assist at home.   Pt currently with functional limitations due to the deficits listed below (see PT Problem List). Pt will benefit from acute skilled PT to increase their independence and safety with mobility to allow discharge.           If plan is discharge home, recommend the following: A little help with walking and/or transfers;A little help with bathing/dressing/bathroom;Assist for transportation;Help with stairs or ramp for entrance;Assistance with cooking/housework   Can travel by private vehicle   Yes    Equipment Recommendations Other (comment) (TBD)  Recommendations for Other Services       Functional Status Assessment Patient has had a recent decline in their functional status and demonstrates the ability to make significant improvements in function in a reasonable and predictable amount of time.     Precautions / Restrictions Precautions Precautions: Other (comment);Fall Recall of Precautions/Restrictions: Intact Precaution/Restrictions Comments: BILATERAL PERCUTANEOUS NEPHROSTOMY TUBES Restrictions Weight Bearing Restrictions Per Provider Order: No      Mobility  Bed Mobility Overal bed mobility: Needs Assistance Bed Mobility: Supine to Sit     Supine to sit: Min assist, Mod assist     General bed mobility comments: increased time, HOH  assist, assist to progress LEs off EOB and elevate trunk    Transfers Overall transfer level: Needs assistance Equipment used: Rolling walker (2 wheels) Transfers: Sit to/from Stand Sit to Stand: Min assist           General transfer comment: cues for hand placement, assist to rise and steady    Ambulation/Gait               General Gait Details: unable d/t fatigue/sleepiness; able to take lateral steps along EOB with min assist and multi-modal cues for sequencing  Stairs            Wheelchair Mobility     Tilt Bed    Modified Rankin (Stroke Patients Only)       Balance Overall balance assessment: History of Falls Sitting-balance support: No upper extremity supported, Feet supported, Single extremity supported Sitting balance-Leahy Scale: Fair     Standing balance support: Reliant on assistive device for balance, During functional activity Standing balance-Leahy Scale: Poor                               Pertinent Vitals/Pain Pain Assessment Pain Assessment: No/denies pain    Home Living Family/patient expects to be discharged to:: Private residence Living Arrangements: Other relatives (cousin) Available Help at Discharge: Family;Available PRN/intermittently (cousin works during day)   Home Access: Stairs to enter Entrance Stairs-Rails: Doctor, general practice of Steps: 6   Home Layout: One level Home Equipment: Cane - single point;Grab bars - tub/shower;Shower seat Additional Comments: Reported getting a rollator from family; PLOF info taken from OT eval, pt is very sleepy during  PT session and speech difficult to understand    Prior Function Prior Level of Function : Independent/Modified Independent;Other (comment)             Mobility Comments: Pt reported cane use at all times but only normally household distances ADLs Comments: indep, cousin assists with IADLS     Extremity/Trunk Assessment   Upper Extremity  Assessment Upper Extremity Assessment: Defer to OT evaluation    Lower Extremity Assessment Lower Extremity Assessment: Generalized weakness       Communication   Communication Communication: Impaired Factors Affecting Communication: Hearing impaired;Reduced clarity of speech    Cognition Arousal: Lethargic Behavior During Therapy: WFL for tasks assessed/performed   PT - Cognitive impairments: No family/caregiver present to determine baseline, Memory                       PT - Cognition Comments: pt oriented to self only, repeatedly asks if he was in his room, and if it was the same room Following commands: Impaired Following commands impaired: Only follows one step commands consistently, Follows one step commands with increased time     Cueing Cueing Techniques: Verbal cues, Tactile cues     General Comments      Exercises     Assessment/Plan    PT Assessment Patient needs continued PT services  PT Problem List Decreased activity tolerance;Decreased mobility;Decreased balance;Decreased cognition;Decreased knowledge of use of DME       PT Treatment Interventions DME instruction;Therapeutic activities;Gait training;Functional mobility training;Therapeutic exercise;Patient/family education;Balance training    PT Goals (Current goals can be found in the Care Plan section)  Acute Rehab PT Goals PT Goal Formulation: With patient Time For Goal Achievement: 06/27/24 Potential to Achieve Goals: Good    Frequency Min 2X/week     Co-evaluation               AM-PAC PT 6 Clicks Mobility  Outcome Measure Help needed turning from your back to your side while in a flat bed without using bedrails?: A Little Help needed moving from lying on your back to sitting on the side of a flat bed without using bedrails?: A Lot Help needed moving to and from a bed to a chair (including a wheelchair)?: A Lot Help needed standing up from a chair using your arms (e.g.,  wheelchair or bedside chair)?: A Little Help needed to walk in hospital room?: A Lot Help needed climbing 3-5 steps with a railing? : A Lot 6 Click Score: 14    End of Session Equipment Utilized During Treatment: Gait belt Activity Tolerance: Patient limited by lethargy Patient left: with call bell/phone within reach;in bed;with bed alarm set Nurse Communication: Mobility status PT Visit Diagnosis: Unsteadiness on feet (R26.81);Other abnormalities of gait and mobility (R26.89)    Time: 8379-8364 PT Time Calculation (min) (ACUTE ONLY): 15 min   Charges:   PT Evaluation $PT Eval Low Complexity: 1 Low   PT General Charges $$ ACUTE PT VISIT: 1 Visit         Ambrosia Wisnewski, PT  Acute Rehab Dept [facility]) [phone]  06/13/2024   [facility] 06/13/2024, 4:51 PM

## 2024-06-13 NOTE — Consult Note (Signed)
 Chief Complaint: History of metastatic prostate cancer. Found to have bilateral hydronephrosis and AKI. Request is for bilateral nephrostomy tube placement  Referring Physician(s): Dr. Renato Ghimire/Machen,G  Supervising Physician: Hughes Simmonds  Patient Status: [facility]  History of Present Illness: Todd Holmes is a 89 y.o. male History of HTN, benign tremors, metastatic prostate cancer.Presented to to the ED at [facility] on 9.28.25 with flank pain. Found to have bilateral hydronephrosis in AKI. CT Abd pelvis from 9.28.25 reads nterval development of bilateral mild to moderate hydroureteronephrosis. No nephroureterolithiasis bilaterally. This may reflect the changes of obstructive uropathy or reflux. Team is requesting bilateral nephrostomy tube placement. Patient is being followed by [facility].   Currently without any significant complaints. Patient alert and laying in bed,calm. Denies any fevers, headache, chest pain, SOB,  abdominal pain,vomiting or bleeding. Has some intermittent nausea, occ cough   BUN 77, Cr 5.22, GFR <  10.  Foley catheter in place. Patient is on heparin prophylactic dosage. Last given on 9.30.25 @ 5:26. All other labs and medications are within acceptable parameters. NKDA. Patient has been NPO since midnight.       Code Status: Limited: Do not attempt resuscitation (DNR) -DNR-LIMITED -Do Not Intubate/DNI    Past Medical History:  Diagnosis Date   Arthritis    knees   Cancer (HCC)    prostate, tx 2005- radiation    Hypertension    Keloid    keloid across chest, pt. unsure of how he encountered it   Memory disorder 11/28/2014   Neuromuscular disorder (HCC)    benign- tremor- occas., treated /w nadolol   Tremor 11/28/2014    Past Surgical History:  Procedure Laterality Date   CATARACT EXTRACTION W/PHACO  09/23/2011   Procedure: CATARACT EXTRACTION PHACO AND INTRAOCULAR LENS PLACEMENT (IOC);  Surgeon: Gaither Quan, MD;  Location: [facility] OR;   Service: Ophthalmology;  Laterality: Right;   EYE SURGERY Bilateral    L cataract removed & IOL   HERNIA REPAIR     bilateral hernia repair, inguinal, 1980's     INGUINAL HERNIA REPAIR Left 02/21/2024   Procedure: LAPAROSCOPIC REPAIR OF INCARCERATED LEFT INGUINAL HERNIA WITH MESH;  Surgeon: Kinsinger, Herlene Righter, MD;  Location: [facility] ORS;  Service: General;  Laterality: Left;    Allergies: Patient has no known allergies.  Medications: Prior to Admission medications   Medication Sig Start Date End Date Taking? Authorizing Provider  escitalopram  (LEXAPRO ) 10 MG tablet Take 10 mg by mouth in the morning. 05/07/22  Yes [provider]  melatonin 5 MG TABS Take 5 mg by mouth at bedtime as needed (for sleep).   Yes [provider]  Multiple Vitamin (MULTIVITAMIN) tablet Take 1 tablet by mouth daily with breakfast.   Yes [provider]  MYRBETRIQ  25 MG TB24 tablet Take 25 mg by mouth in the morning. 05/07/22  Yes [provider]  ondansetron  (ZOFRAN ) 4 MG tablet Take 1 tablet (4 mg total) by mouth every 6 (six) hours as needed for nausea. 02/24/24  Yes Cheryle Page, MD  TYLENOL  500 MG tablet Take 500 mg by mouth every 6 (six) hours as needed (for pain).   Yes [provider]  polyethylene glycol (MIRALAX  / GLYCOLAX ) 17 g packet Take 17 g by mouth daily. Patient not taking: Reported on 06/11/2024 02/25/24   Cheryle Page, MD     Family History  Problem Relation Age of Onset   Anesthesia problems Neg Hx    Hypotension Neg Hx  Malignant hyperthermia Neg Hx    Pseudochol deficiency Neg Hx    Cancer Mother    Stroke Father    Lymphoma Brother     Social History   Socioeconomic History   Marital status: Single    Spouse name: Not on file   Number of children: 0   Years of education: 14   Highest education level: Not on file  Occupational History   Occupation: retired  Tobacco Use   Smoking status: Never   Smokeless tobacco: Never  Substance  and Sexual Activity   Alcohol  use: No   Drug use: No   Sexual activity: Not on file  Other Topics Concern   Not on file  Social History Narrative   Patient is right handed.   Patient does not drink caffeine.   Social Drivers of Corporate investment banker Strain: Not on file  Food Insecurity: No Food Insecurity (06/11/2024)   Hunger Vital Sign    Worried About Running Out of Food in the Last Year: Never true    Ran Out of Food in the Last Year: Never true  Transportation Needs: No Transportation Needs (06/11/2024)   PRAPARE - Administrator, Civil Service (Medical): No    Lack of Transportation (Non-Medical): No  Physical Activity: Not on file  Stress: Not on file  Social Connections: Unknown (06/12/2024)   Social Connection and Isolation Panel    Frequency of Communication with Friends and Family: Not on file    Frequency of Social Gatherings with Friends and Family: Not on file    Attends Religious Services: Not on file    Active Member of Clubs or Organizations: Yes    Attends Banker Meetings: Not on file    Marital Status: Not on file      Review of Systems see above  Vital Signs: BP (!) 185/99 (BP Location: Left Arm)   Pulse 90   Temp 97.9 F (36.6 C) (Oral)   Resp 20   Ht 5' 1 (1.549 m)   Wt 131 lb 13.4 oz (59.8 kg)   SpO2 95%   BMI 24.91 kg/m   Advance Care Plan: no documents on file   Physical Exam: awake/answers simple questions ok; chest- sl dim BS bases; heart- RRR; abd-soft,+BS,NT; no LE edema  Imaging: CT L-SPINE NO CHARGE Result Date: 06/11/2024 CLINICAL DATA:  left flank pain starting yesterday. [facility] by [facility] after UA dip. Has stage four metastatic prostate cancer and has been losing weight. Denies any current urinary symptoms. EXAM: CT LUMBAR SPINE WITHOUT CONTRAST TECHNIQUE: Multidetector CT imaging of the lumbar spine was performed without intravenous contrast administration. Multiplanar CT image reconstructions were  also generated. RADIATION DOSE REDUCTION: This exam was performed according to the departmental dose-optimization program which includes automated exposure control, adjustment of the mA and/or kV according to patient size and/or use of iterative reconstruction technique. COMPARISON:  CT abdomen pelvis 02/20/2024 FINDINGS: Segmentation: 5 lumbar type vertebrae. Alignment: Similar-appearing levoscoliosis of the lumbar spine centered at the L3-L4 level. Vertebrae: Diffusely decreased bone density. Similar-appearing scattered blastic osseous metastasis of the lumbar spine. Redemonstration of lytic and blastic osseous lesions of the sacrum. No acute fracture or focal pathologic process. Paraspinal and other soft tissues: Negative. Disc levels: Multilevel intervertebral disc space narrowing and vacuum phenomenon. IMPRESSION: 1. Similar-appearing scattered blastic osseous metastasis of the lumbar spine. Redemonstration of lytic and blastic osseous lesions of the sacrum. 2. No acute displaced fracture or traumatic listhesis of  the lumbar spine. 3. Diffusely decreased bone density. Electronically Signed   By: Morgane  Naveau M.D.   On: 06/11/2024 19:16   CT ABDOMEN PELVIS WO CONTRAST Result Date: 06/11/2024 CLINICAL DATA:  Abdominal pain, acute, nonlocalized left flank pain EXAM: CT ABDOMEN AND PELVIS WITHOUT CONTRAST TECHNIQUE: Multidetector CT imaging of the abdomen and pelvis was performed following the standard protocol without IV contrast. RADIATION DOSE REDUCTION: This exam was performed according to the departmental dose-optimization program which includes automated exposure control, adjustment of the mA and/or kV according to patient size and/or use of iterative reconstruction technique. COMPARISON:  CT abdomen pelvis 02/20/2024 FINDINGS: Lower chest: Trace left pleural effusion.  Tiny hiatal hernia. Hepatobiliary: No focal liver abnormality. No gallstones, gallbladder wall thickening, or pericholecystic fluid. No  biliary dilatation. Pancreas: Diffuse coarse calcifications. Diffusely atrophic. No focal lesion. Otherwise normal pancreatic contour. No surrounding inflammatory changes. No main pancreatic ductal dilatation. ductal dilatation. Spleen: Normal in size without focal abnormality. Adrenals/Urinary Tract: No adrenal nodule bilaterally. Interval development of bilateral mild to moderate hydroureteronephrosis. No nephrolithiasis. No ureterolithiasis. The urinary bladder is unremarkable. Stomach/Bowel: Stomach is within normal limits. No evidence of bowel wall thickening or dilatation. Diffuse colonic diverticulosis. Appendix appears normal. Vascular/Lymphatic: Phleboliths noted along the pelvis. No abdominal aorta or iliac aneurysm. Moderate atherosclerotic plaque of the aorta and its branches. Difficult to measure on this noncontrast study persistent retroperitoneal and pelvic lymphadenopathy. Reproductive: Prostate is unremarkable with radiation seeds noted. Other: Interval development of diffuse mild mesenteric edema. Interval development of trace volume intraperitoneal free fluid. No intraperitoneal free gas. No organized fluid collection. Musculoskeletal: Status post left inguinal hernia repair with mesh. No recurrent hernia. No abdominal wall hernia or abnormality. Lytic and blastic metastatic lesions of the sacrum again noted. Please see separately dictated CT lumbar spine 06/11/2024. No acute displaced fracture. IMPRESSION: 1. Interval development of bilateral mild to moderate hydroureteronephrosis. No nephroureterolithiasis bilaterally. This may reflect the changes of obstructive uropathy or reflux. 2. Trace left pleural effusion. 3. Interval development of mild mesenteric edema and trace volume simple free fluid ascites. 4. Tiny hiatal hernia. 5. Chronic pancreatitis with no findings of acute pancreatitis. 6. Colonic diverticulosis with no acute diverticulitis. 7. Difficult to measure on this noncontrast study  persistent retroperitoneal and pelvic lymphadenopathy. Persistent lytic and blastic metastatic lesions in a patient with known prostate cancer. Electronically Signed   By: Morgane  Naveau M.D.   On: 06/11/2024 19:10   CT ABDOMEN PELVIS WO CONTRAST Addendum Date: 06/11/2024 ADDENDUM REPORT: 06/11/2024 17:49 ADDENDUM: PA Leita Chancy was notified to disregard this report for Raydel Hosick due to incorrect images placed on patient's record by CT technologist. Critical Value/emergent results were called by telephone on 06/11/2024 at 5:43 pm to provider LEITA MATTER , who verbally acknowledged these results and is taking care of patient with images provided. New report will be generated within images are moved to the correct patient. Electronically Signed   By: Leita Birmingham M.D.   On: 06/11/2024 17:49   Addendum Date: 06/11/2024 ADDENDUM REPORT: 06/11/2024 17:31 ADDENDUM: Per CT technologist Summer Tanbouz the CT images placed on patient's record is a for a different patient. Physicians will be notified of results and images placed in the correct patient's record. Electronically Signed   By: Leita Birmingham M.D.   On: 06/11/2024 17:31   Addendum Date: 06/11/2024 ADDENDUM REPORT: 06/11/2024 16:01 ADDENDUM: Critical Value/emergent results were called by telephone at the time of interpretation on 06/11/2024 at 4:01 pm to provider [facility] ,  who verbally acknowledged these results. Electronically Signed   By: Leita Birmingham M.D.   On: 06/11/2024 16:01   Result Date: 06/11/2024 CLINICAL DATA:  Left lower quadrant abdominal pain. Left flank pain starting yesterday. History of stage IV metastatic prostate cancer. EXAM: CT ABDOMEN AND PELVIS WITHOUT CONTRAST TECHNIQUE: Multidetector CT imaging of the abdomen and pelvis was performed following the standard protocol without IV contrast. RADIATION DOSE REDUCTION: This exam was performed according to the departmental dose-optimization program which includes automated  exposure control, adjustment of the mA and/or kV according to patient size and/or use of iterative reconstruction technique. COMPARISON:  02/20/2024. FINDINGS: Lower chest: Multi-vessel coronary artery calcifications are noted. Mild atelectasis is present at the lung bases. Hepatobiliary: No focal liver abnormality is seen. No biliary ductal dilatation. Stones are seen within the gallbladder. Pancreas: Unremarkable. No pancreatic ductal dilatation or surrounding inflammatory changes. Spleen: Normal in size without focal abnormality. Adrenals/Urinary Tract: There is thickening of the adrenal glands bilaterally without evidence of discrete nodule., possible hyperplasia. A nonobstructive renal calculus is noted on the left. Arterial calcifications are also seen on the left. No hydronephrosis bilaterally. There is mild thickening of the anterior bladder wall with associated inflammatory changes. Stomach/Bowel: The stomach is within normal limits. No bowel obstruction. There is pneumatosis involving a segment of small bowel the mid left abdomen, axial image 46 with associated foci of free air is in the left abdomen. There is a ventral abdominal wall hernia containing nonobstructed small bowel. The appendix is not well delineated. Vascular/Lymphatic: Aortic atherosclerosis. No enlarged abdominal or pelvic lymph nodes. Reproductive: Prostate gland is mildly enlarged. Other: No abdominopelvic ascites. Musculoskeletal: Degenerative changes are present in the thoracolumbar spine. Please see CT lumbar spine for additional information. IMPRESSION: 1. Segment of small bowel in the the left abdomen with pneumatosis and associated foci of free air, uncertain ideology and perforation cannot be excluded. Surgical consultation is recommended. 2. Bladder wall thickening with associated inflammatory changes, possible infectious or inflammatory cystitis. 3. Low anterior abdominal wall hernia containing nonobstructed small bowel. 4.  Nonobstructive left renal calculus. 5. Aortic atherosclerosis. Electronically Signed: By: Leita Birmingham M.D. On: 06/11/2024 15:57   DG Chest Port 1 View Result Date: 06/11/2024 CLINICAL DATA:  Questionable sepsis-evaluate for abnormality. EXAM: PORTABLE CHEST 1 VIEW COMPARISON:  Radiographs 02/21/2024 and 09/11/2011. Abdominal CT 06/11/2024. FINDINGS: 1625 hours. Low lung volumes with mildly increased atelectasis at both lung bases. The heart size and mediastinal contours are stable with aortic and brachycephalic tortuosity. There is no confluent airspace disease, pneumothorax or significant pleural effusion. Thoracolumbar scoliosis without evidence of acute osseous abnormality. IMPRESSION: Low lung volumes with mildly increased bibasilar atelectasis. No evidence of pneumonia. Electronically Signed   By: Elsie Perone M.D.   On: 06/11/2024 16:57   CT L-SPINE NO CHARGE Result Date: 06/11/2024 CLINICAL DATA:  Left flank pain. Stage IV metastatic prostate cancer. EXAM: CT LUMBAR SPINE WITHOUT CONTRAST TECHNIQUE: Multidetector CT imaging of the lumbar spine was performed without intravenous contrast administration. Multiplanar CT image reconstructions were also generated. RADIATION DOSE REDUCTION: This exam was performed according to the departmental dose-optimization program which includes automated exposure control, adjustment of the mA and/or kV according to patient size and/or use of iterative reconstruction technique. COMPARISON:  None Available. FINDINGS: Segmentation: 5 lumbar type vertebrae. Alignment: Normal. Vertebrae: No acute fracture is seen. Osteopenia is noted. No focal lytic or destructive lesion is seen. Paraspinal and other soft tissues: No acute abnormality. Please see CT abdomen and pelvis for  additional information. Disc levels: There is multilevel intervertebral disc space narrowing, degenerative endplate changes, disc herniations and facet arthropathy resulting in mild-to-moderate spinal  canal and neural foraminal stenosis. There is severe spinal canal stenosis at L3-L4. IMPRESSION: 1. No acute fracture or lytic or destructive lesion. 2. Multilevel degenerative changes in the lumbar spine as described above. Electronically Signed   By: Leita Birmingham M.D.   On: 06/11/2024 16:05    Labs:  CBC: Recent Labs    04/06/24 1500 06/01/24 1454 06/11/24 1233 06/12/24 0425  WBC 2.7* 3.0* 4.8 4.8  HGB 8.9* 9.8* 10.1* 8.8*  HCT 27.3* 29.9* 31.5* 28.1*  PLT 110* 114* 105* 92*    COAGS: Recent Labs    02/21/24 0105  INR 1.2    BMP: Recent Labs    06/12/24 0425 06/12/24 0939 06/12/24 1709 06/13/24 0510  NA 140 140 142 142  K 3.7 3.8 3.9 4.2  CL 104 105 104 106  CO2 20* 19* 17* 16*  GLUCOSE 83 101* 157* 146*  BUN 53* 54* 57* 77*  CALCIUM 8.5* 8.6* 8.3* 8.5*  CREATININE 4.25* 4.48* 4.58* 5.22*  GFRNONAA 12* 12* 11* 10*    LIVER FUNCTION TESTS: Recent Labs    02/21/24 0328 04/06/24 1500 06/01/24 1454 06/11/24 1233  BILITOT 1.3* 0.4 0.5 0.9  AST 29 19 27  38  ALT 11 5 8 10   ALKPHOS 58 56 67 67  PROT 7.3 6.7 7.1 7.4  ALBUMIN 3.6 3.5 3.8 4.1    TUMOR MARKERS: No results for input(s): AFPTM, CEA, CA199, CHROMGRNA in the last 8760 hours.  Assessment and Plan:  89 y.o. male inpatient.History of HTN, benign tremors, metastatic prostate cancer. Presented to the ED at [facility] on 9.28.25 with flank pain. Found to have bilateral hydro in AKI. CT Abd pelvis from 9.28.25 reads nterval development of bilateral mild to moderate hydroureteronephrosis. No nephroureterolithiasis bilaterally. This may reflect the changes of obstructive uropathy or reflux. Team is requesting bilateral nephrostomy tube placement. Patient is being followed by [facility].   PLAN: IR Image Guided Bilateral Nephrostomy Tube Placement; images have been reviewed by Dr. Hughes  Risks and benefits of bilateral  PCN placement was discussed with the patient /POA Morene Dollar including,  but not limited to, infection, bleeding, significant bleeding causing loss or decrease in renal function or damage to adjacent structures.   All of the patient's questions were answered, patient is agreeable to proceed.  Consent signed and in chart. Procedure scheduled for later today  Creat 5.22/PT 17/INR 1.3/ plts 92k, hgb 8.8, WBC nl  Thank you for this interesting consult.  I greatly enjoyed meeting DELONTAE LAMM and look forward to participating in their care.  A copy of this report was sent to the requesting provider on this date.  Electronically Signed: Delon JAYSON Beagle, NP/Kevin Aadil Sur,PA-C 06/13/2024, 9:11 AM   I spent a total of 40 Minutes    in face to face in clinical consultation, greater than 50% of which was counseling/coordinating care for bilateral nephrostomy tube placement

## 2024-06-13 NOTE — Evaluation (Signed)
 Occupational Therapy Evaluation Patient Details Name: Todd Holmes MRN: [id number] DOB: [birth year] Today's Date: 06/13/2024   History of Present Illness   Todd Holmes is a 47 male admitted due to AKI. PMH: HTN, prostate cancer, anemia, arthritis, memory disorder, tremor, hernia repair     Clinical Impressions Pt reported at PLOF they use a SPC and mostly only ambulate household distances. He reported his cousin lives with them but works during the day but able to assist with IADL, grocery shopping/medication and taking pt to md visits. He required increase in time as felt nausea but able to complete sit to stand transfers with CGA to FW and was able to ambulate into bathroom to complete BM with CGA to min assist for peri care. At this time recommendation for [facility] therapy with the return to home.      If plan is discharge home, recommend the following:   A little help with bathing/dressing/bathroom;Assistance with cooking/housework;Help with stairs or ramp for entrance     Functional Status Assessment   Patient has had a recent decline in their functional status and demonstrates the ability to make significant improvements in function in a reasonable and predictable amount of time.     Equipment Recommendations    (FW)     Recommendations for Other Services         Precautions/Restrictions   Precautions Precautions: Fall Recall of Precautions/Restrictions: Intact Precaution/Restrictions Comments: Pt reported no falls but lean posterior sometimes Restrictions Weight Bearing Restrictions Per Provider Order: No     Mobility Bed Mobility Overal bed mobility: Modified Independent             General bed mobility comments: increase in time and use of bed rail    Transfers Overall transfer level: Needs assistance Equipment used: Rolling walker (2 wheels) Transfers: Sit to/from Stand Sit to Stand: Contact guard assist, Supervision            General transfer comment: cues on hamd placement      Balance Overall balance assessment: Needs assistance Sitting-balance support: Feet supported Sitting balance-Leahy Scale: Fair     Standing balance support: Bilateral upper extremity supported, Single extremity supported Standing balance-Leahy Scale: Fair                             ADL either performed or assessed with clinical judgement   ADL Overall ADL's : Needs assistance/impaired Eating/Feeding: NPO   Grooming: Wash/dry hands;Wash/dry face;Sitting;Set up   Upper Body Bathing: Set up;Sitting   Lower Body Bathing: Contact guard assist;Sit to/from stand   Upper Body Dressing : Set up;Sitting   Lower Body Dressing: Contact guard assist;Sit to/from stand   Toilet Transfer: Contact guard assist;Cueing for safety;Cueing for sequencing;Rolling walker (2 wheels)   Toileting- Clothing Manipulation and Hygiene: Contact guard assist;Minimal assistance;Sit to/from stand       Functional mobility during ADLs: Contact guard assist;Rolling walker (2 wheels)       Vision Baseline Vision/History: 1 Wears glasses Ability to See in Adequate Light: 0 Adequate Patient Visual Report: No change from baseline Vision Assessment?: Wears glasses for reading;Wears glasses for driving     Perception Perception: Within Functional Limits       Praxis Praxis: WFL       Pertinent Vitals/Pain Pain Assessment Pain Assessment: 0-10 Pain Score: 1  Pain Location: stomache Pain Descriptors / Indicators: Discomfort Pain Intervention(s): Limited activity within patient's tolerance, Monitored during session, Repositioned  Extremity/Trunk Assessment Upper Extremity Assessment Upper Extremity Assessment: Generalized weakness   Lower Extremity Assessment Lower Extremity Assessment: Defer to PT evaluation   Cervical / Trunk Assessment Cervical / Trunk Assessment: Kyphotic   Communication Communication Communication:  No apparent difficulties Factors Affecting Communication: Hearing impaired   Cognition Arousal: Alert Behavior During Therapy: WFL for tasks assessed/performed Cognition: No apparent impairments                               Following commands: Intact       Cueing  General Comments   Cueing Techniques: Verbal cues      Exercises     Shoulder Instructions      Home Living Family/patient expects to be discharged to:: Private residence Living Arrangements: Other relatives (Cousin-Erica) Available Help at Discharge: Family;Available PRN/intermittently (works during the day) Type of Home: House Home Access: Stairs to enter [employer] of Steps: 6 Entrance Stairs-Rails: Right;Left Home Layout: One level     Bathroom Shower/Tub: Tub/shower unit         Home Equipment: Cane - single point;Grab bars - tub/shower;Shower seat   Additional Comments: Reported getting a rollator from family      Prior Functioning/Environment Prior Level of Function : Independent/Modified Independent             Mobility Comments: Pt reported cane use at all times but only normally household distances ADLs Comments: indep, caousin assists with IADLS    OT Problem List: Decreased strength;Impaired balance (sitting and/or standing);Decreased safety awareness;Decreased knowledge of use of DME or AE   OT Treatment/Interventions: Self-care/ADL training;Therapeutic exercise;Therapeutic activities;Patient/family education;Balance training      OT Goals(Current goals can be found in the care plan section)   Acute Rehab OT Goals Patient Stated Goal: to get stronger OT Goal Formulation: With patient Time For Goal Achievement: 06/21/24 Potential to Achieve Goals: Fair   OT Frequency:  Min 2X/week    Co-evaluation              AM-PAC OT 6 Clicks Daily Activity     Outcome Measure Help from another person eating meals?: Total Help from another person  taking care of personal grooming?: None Help from another person toileting, which includes using toliet, bedpan, or urinal?: A Little Help from another person bathing (including washing, rinsing, drying)?: A Little Help from another person to put on and taking off regular upper body clothing?: None Help from another person to put on and taking off regular lower body clothing?: A Little 6 Click Score: 18   End of Session Equipment Utilized During Treatment: Gait belt;Rolling walker (2 wheels) Nurse Communication: Mobility status  Activity Tolerance: Patient tolerated treatment well Patient left: in chair;with call bell/phone within reach  OT Visit Diagnosis: Unsteadiness on feet (R26.81);Repeated falls (R29.6);Other abnormalities of gait and mobility (R26.89);Muscle weakness (generalized) (M62.81)                Time: 9260-9188 OT Time Calculation (min): 32 min Charges:  OT General Charges $OT Visit: 1 Visit OT Evaluation $OT Eval Low Complexity: 1 Low OT Treatments $Self Care/Home Management : 8-22 mins  Warrick POUR OTR/L  Acute Rehab Services  [phone] office number   Warrick Berber 06/13/2024, 8:19 AM

## 2024-06-13 NOTE — Progress Notes (Signed)
 [facility] Progress Note  Subjective:  Just got back from IR bilat PCN placement  Vitals:   06/13/24 1450 06/13/24 1455 06/13/24 1500 06/13/24 1524  BP: 134/80 137/76 136/73 129/72  Pulse: 89 90 89 89  Resp: (!) 21 (!) 22 20 18   Temp:    98 F (36.7 C)  TempSrc:    Oral  SpO2: 95% 95% 95% 95%  Weight:      Height:        Exam: Gen alert, no distress, on RA Sclera anicteric, throat clear  Mild JVD R side Chest clear bilat to bases RRR no MRG Abd soft ntnd no mass or ascites +bs Ext 1+ L > R ankle edema bilat, no other edema Neuro is alert, Ox 3 , nf     Home bp meds: none   Date                             Creat               eGFR (ml/min) 2012- July 20250.87- 1.13> 60 ml/min 06/01/24                        1.17 06/11/24                        3.19 06/12/24                        4.25          UA 9/28-  large Hb, neg protein, > 50 rbcs, 0-5 wbc/epi CT abd 9/28 noncontrast - Urinary Tract:Interval development of bilateral mild to moderate hydroureteronephrosis. No nephrolithiasis. No ureterolithiasis.The urinary bladder is unremarkable. Na+ 140  K+ 3.8  bun 54, creat 4.5   Hb 8.8  plt 92  WBC 4K.        Assessment/ Plan: Acute renal failure: b/l from sept 2025 was 1.17, egfr > 60 ml/min. Creatinine on admission was 3.1 yest and 4.2 today ths in the setting of bilat flank pain and stage IV prostate cancer. CT showed bilat new hydronephrosis 9/28. UA showed hematuria, no proteinuria or wbc's. Creat did not improve after IVFs and foley cath placement. Suspected AKI is due to obstruction. Pt went to IR for bilat PCN's today. Creat up as expected this am. Remains euvolemic on exam and no urgent need for RRT. F/u labs in am.  Stage IV prostate cancer HTN: not on any BP meds at home it appears Volume: euvolemic on exam, mild ankle edema, CXR neg, ok for cont IVF 50 cc/hr       Myer Fret MD  [facility] 06/13/2024, 3:25 PM  Recent Labs  Lab 06/11/24 1233  06/12/24 0425 06/12/24 0939 06/12/24 1709 06/13/24 0510  HGB 10.1* 8.8*  --   --   --   ALBUMIN 4.1  --   --   --   --   CALCIUM 9.2 8.5*   < > 8.3* 8.5*  CREATININE 3.19* 4.25*   < > 4.58* 5.22*  K 3.7 3.7   < > 3.9 4.2   < > = values in this interval not displayed.   No results for input(s): IRON, TIBC, FERRITIN in the last 168 hours. Inpatient medications:  amLODipine   5 mg Oral Daily   Chlorhexidine  Gluconate Cloth  6 each Topical Daily   escitalopram   10 mg Oral Daily   [START ON 06/14/2024] heparin injection (subcutaneous)  5,000 Units Subcutaneous Q8H   lidocaine -EPINEPHrine   20 mL Intradermal Once   mirabegron  ER  25 mg Oral Daily    sodium chloride  50 mL/hr at 06/13/24 0353   promethazine (PHENERGAN) injection (IM or IVPB) 12.5 mg (06/12/24 1337)   acetaminophen  **OR** acetaminophen , albuterol, hydrALAZINE, HYDROmorphone (DILAUDID) injection, iohexol , ondansetron  **OR** ondansetron  (ZOFRAN ) IV, promethazine (PHENERGAN) injection (IM or IVPB), traMADol , traZODone

## 2024-06-13 NOTE — Sedation Documentation (Signed)
 RN Rorie Delmore pulled 2 mg Versed  and 100 mcg Fentanyl  in Ir room pysix. Pt. Received 1.5 mg Versed  and 50 mcg Fentanyl  throughout the procedure.

## 2024-06-14 DIAGNOSIS — N179 Acute kidney failure, unspecified: Secondary | ICD-10-CM | POA: Diagnosis not present

## 2024-06-14 LAB — BASIC METABOLIC PANEL WITH GFR
Anion gap: 16 — ABNORMAL HIGH (ref 5–15)
BUN: 72 mg/dL — ABNORMAL HIGH (ref 8–23)
CO2: 18 mmol/L — ABNORMAL LOW (ref 22–32)
Calcium: 8.5 mg/dL — ABNORMAL LOW (ref 8.9–10.3)
Chloride: 113 mmol/L — ABNORMAL HIGH (ref 98–111)
Creatinine, Ser: 3.87 mg/dL — ABNORMAL HIGH (ref 0.61–1.24)
GFR, Estimated: 14 mL/min — ABNORMAL LOW (ref >60)
Glucose, Bld: 121 mg/dL — ABNORMAL HIGH (ref 70–99)
Potassium: 3.4 mmol/L — ABNORMAL LOW (ref 3.5–5.1)
Sodium: 146 mmol/L — ABNORMAL HIGH (ref 135–145)

## 2024-06-14 NOTE — NC FL2 (Signed)
 [place]  MEDICAID [id number] LEVEL OF CARE FORM     IDENTIFICATION  Patient Name: GRADEN HOSHINO Birthdate: [birth year] Sex: male Admission Date (Current Location): 06/11/2024  [facility] and [place] Number:  Producer, television/film/video and Address:  [facility],  [address] [place], [place] [address]      Provider Number: [id number]  Attending Physician Name and Address:  Raenelle Coria, MD  Relative Name and Phone Number:  andra bernice Dunk)  [phone] [facility])    Current Level of Care: Hospital Recommended Level of Care: Skilled Nursing Facility Prior Approval Number:    Date Approved/Denied:   PASRR Number: [id number]  Discharge Plan: SNF    Current Diagnoses: Patient Active Problem List   Diagnosis Date Noted   AKI (acute kidney injury) 06/11/2024   Incarcerated hernia 02/21/2024   Essential hypertension 02/21/2024   Anemia 02/21/2024   SBO (small bowel obstruction) (HCC) 02/21/2024   Leg edema, right 02/21/2024   Metastatic castration-resistant adenocarcinoma of prostate (HCC) 05/12/2022   Memory disorder 11/28/2014   Tremor 11/28/2014    Orientation RESPIRATION BLADDER Height & Weight     Self, Time, Situation, Place  Normal Continent, Indwelling catheter (Left and Right Nephrostomy Tubes inserted 06/13/24) Weight: 61.3 kg Height:  5' 1 (154.9 cm)  BEHAVIORAL SYMPTOMS/MOOD NEUROLOGICAL BOWEL NUTRITION STATUS      Continent Diet (regular)  AMBULATORY STATUS COMMUNICATION OF NEEDS Skin   Limited Assist Verbally Other (Comment) (Left and Right Nephrostomy Tubes placed 06/13/2024)                       Personal Care Assistance Level of Assistance  Bathing, Feeding, Dressing Bathing Assistance: Limited assistance Feeding assistance: Limited assistance Dressing Assistance: Limited assistance     Functional Limitations Info  Sight, Hearing, Speech Sight Info: Impaired (eyeglasses) Hearing Info: Adequate Speech Info: Adequate    SPECIAL CARE  FACTORS FREQUENCY  PT (By licensed PT), OT (By licensed OT)     PT Frequency: 5x/wk OT Frequency: 5x/wk            Contractures Contractures Info: Not present    Additional Factors Info  Code Status, Allergies, Psychotropic Code Status Info: DNR Allergies Info: NKA Psychotropic Info: Lexapro  10mg  po daily         Current Medications (06/14/2024):  This is the current hospital active medication list Current Facility-Administered Medications  Medication Dose Route Frequency Provider Last Rate Last Admin   0.9 %  sodium chloride  infusion   Intravenous Continuous Zella, Mir M, MD 50 mL/hr at 06/14/24 0221 New Bag at 06/14/24 0221   acetaminophen  (TYLENOL ) tablet 650 mg  650 mg Oral Q6H PRN Zella, Mir M, MD       Or   acetaminophen  (TYLENOL ) suppository 650 mg  650 mg Rectal Q6H PRN Zella, Mir M, MD       albuterol (PROVENTIL) (2.5 MG/3ML) 0.083% nebulizer solution 2.5 mg  2.5 mg Nebulization Q2H PRN Zella, Mir M, MD       amLODipine  (NORVASC ) tablet 5 mg  5 mg Oral Daily Ghimire, Kuber, MD   5 mg at 06/14/24 1054   Chlorhexidine  Gluconate Cloth 2 % PADS 6 each  6 each Topical Daily Zella, Mir M, MD   6 each at 06/14/24 1054   escitalopram  (LEXAPRO ) tablet 10 mg  10 mg Oral Daily Ikramullah, Mir M, MD   10 mg at 06/14/24 1054   heparin injection 5,000 Units  5,000 Units Subcutaneous Q8H Allred, Darrell K,  PA-C   5,000 Units at 06/14/24 0533   hydrALAZINE (APRESOLINE) injection 10 mg  10 mg Intravenous Q4H PRN Ghimire, Kuber, MD   10 mg at 06/13/24 0715   HYDROmorphone (DILAUDID) injection 0.25 mg  0.25 mg Intravenous Q4H PRN Ghimire, Kuber, MD       mirabegron  ER (MYRBETRIQ ) tablet 25 mg  25 mg Oral Daily Ikramullah, Mir M, MD   25 mg at 06/14/24 1054   ondansetron  (ZOFRAN ) tablet 4 mg  4 mg Oral Q6H PRN Zella, Mir M, MD   4 mg at 06/12/24 0900   Or   ondansetron  (ZOFRAN ) injection 4 mg  4 mg Intravenous Q6H PRN Zella, Mir M, MD   4 mg at 06/13/24  0526   promethazine (PHENERGAN) 12.5 mg in sodium chloride  0.9 % 50 mL IVPB  12.5 mg Intravenous Q6H PRN Raenelle Coria, MD 150 mL/hr at 06/12/24 1337 12.5 mg at 06/12/24 1337   sodium chloride  flush (NS) 0.9 % injection 5 mL  5 mL Intracatheter Q8H Mugweru, Jon, MD   5 mL at 06/14/24 0541   traMADol  (ULTRAM ) tablet 50 mg  50 mg Oral Q6H PRN Zella Katha HERO, MD   50 mg at 06/14/24 0231   traZODone (DESYREL) tablet 25 mg  25 mg Oral QHS PRN Zella Katha HERO, MD   25 mg at 06/12/24 2130     Discharge Medications: Please see discharge summary for a list of discharge medications.  Relevant Imaging Results:  Relevant Lab Results:   Additional Information SSN: [id number]  Alfonse JONELLE Rex, RN

## 2024-06-14 NOTE — Progress Notes (Signed)
 [facility] Progress Note  Subjective:  Good UOP from PCN's Creat stable low 5s today Pt up in chair, no appetite but looks much better  Vitals:   06/13/24 2203 06/14/24 0500 06/14/24 0618 06/14/24 1318  BP: (!) 152/80  (!) 161/90 (!) 141/84  Pulse: 94  (!) 104 92  Resp: 15  17 16   Temp: 98 F (36.7 C)  98.2 F (36.8 C) 98.1 F (36.7 C)  TempSrc: Oral  Oral Oral  SpO2: 94%  95% 96%  Weight:  61.3 kg    Height:        Exam: Gen alert, no distress, on RA Sclera anicteric, throat clear  Mild JVD R side Chest clear bilat to bases RRR no MRG Abd soft ntnd no mass or ascites +bs Ext 1+ L > R ankle edema bilat Neuro is alert, Ox 3 , nf     Home bp meds: none   Date                             Creat               eGFR (ml/min) 2012- July 20250.87- 1.13> 60 ml/min 06/01/24                        1.17 06/11/24                        3.19 06/12/24                        4.25          UA 9/28-  large Hb, neg protein, > 50 rbcs, 0-5 wbc/epi CT abd 9/28 noncontrast - Urinary Tract:Interval development of bilateral mild to moderate hydroureteronephrosis. No nephrolithiasis. No ureterolithiasis.The urinary bladder is unremarkable. Na+ 140  K+ 3.8  bun 54, creat 4.5   Hb 8.8  plt 92  WBC 4K.        Assessment/ Plan: Acute renal failure: b/l from sept 2025 was 1.17, egfr > 60 ml/min. Creatinine on admission was 3.1 yest and 4.2 today ths in the setting of bilat flank pain and stage IV prostate cancer. CT showed bilat new hydronephrosis 9/28. UA showed hematuria, no proteinuria or wbc's. Creat did not improve after IVFs and foley cath placement. Suspected AKI is due to obstruction. IR did bilat PCN tubes on 9/30, UOP was good overnight. Creat stable low 5s today, pt stable, looks a bit better. Cont to follow.  Stage IV prostate cancer HTN: not on any BP meds at home it appears Volume: euvolemic on exam, mild ankle edema, CXR neg. Stable.        Myer Fret MD   [facility] 06/14/2024, 4:07 PM  Recent Labs  Lab 06/11/24 1233 06/12/24 0425 06/12/24 0939 06/13/24 1710 06/14/24 0905  HGB 10.1* 8.8*  --   --   --   ALBUMIN 4.1  --   --   --   --   CALCIUM 9.2 8.5*   < > 8.5* 8.5*  CREATININE 3.19* 4.25*   < > 5.29* 3.87*  K 3.7 3.7   < > 4.1 3.4*   < > = values in this interval not displayed.   No results for input(s): IRON, TIBC, FERRITIN in the last 168 hours. Inpatient medications:  amLODipine   5 mg Oral Daily   Chlorhexidine   Gluconate Cloth  6 each Topical Daily   escitalopram   10 mg Oral Daily   heparin injection (subcutaneous)  5,000 Units Subcutaneous Q8H   mirabegron  ER  25 mg Oral Daily   sodium chloride  flush  5 mL Intracatheter Q8H    sodium chloride  50 mL/hr at 06/14/24 0221   promethazine (PHENERGAN) injection (IM or IVPB) 12.5 mg (06/12/24 1337)   acetaminophen  **OR** acetaminophen , albuterol, hydrALAZINE, HYDROmorphone (DILAUDID) injection, ondansetron  **OR** ondansetron  (ZOFRAN ) IV, promethazine (PHENERGAN) injection (IM or IVPB), traMADol , traZODone

## 2024-06-14 NOTE — Progress Notes (Signed)
 Subjective: First time meeting Todd Holmes this morning.  He was up in chair.  Reviewed case and plan.  No acute events overnight.  Ongoing nausea most likely 2/2 obstructive uropathy.  Objective: Vital signs in last 24 hours: Temp:  [97.5 F (36.4 C)-98.2 F (36.8 C)] 98.2 F (36.8 C) (10/01 0618) Pulse Rate:  [84-104] 104 (10/01 0618) Resp:  [15-29] 17 (10/01 0618) BP: (121-161)/(72-90) 161/90 (10/01 0618) SpO2:  [91 %-100 %] 95 % (10/01 0618) Weight:  [61.3 kg] 61.3 kg (10/01 0500)  Assessment/Plan: # Stage IV prostate cancer # Bilateral ureteral obstruction w/ hydronephrosis # ARF # Nausea  Bilateral PCNT placed for bilateral ureteral obstruction 2/2 prostate cancer.  Patient tolerating well with excellent UOP.  Clear yellow urine on the right and tea colored on the left.  These will likely be long-term palliation.  Follow-up with interventional radiology for scheduled exchanges.  Continue to trend labs.  Interval improvement in serum creatinine overnight.  UOP-1570mL   Nausea has resolved with kidney function.  Okay to discharge from urologic perspective.  Please call with questions  Intake/Output from previous day: 09/30 0701 - 10/01 0700 In: 1715.6 [P.O.:530; I.V.:1065.6; IV Piggyback:100] Out: 1565 [Urine:1565]  Intake/Output this shift: Total I/O In: 120 [P.O.:120] Out: 550 [Urine:550]  Physical Exam:  General: Alert and oriented CV: No cyanosis Lungs: equal chest rise Abdomen: Soft, NTND, no rebound or guarding Gu: Foley catheter in place with no output  Lab Results: Recent Labs    06/12/24 0425  HGB 8.8*  HCT 28.1*   BMET Recent Labs    06/12/24 0425 06/12/24 0939 06/13/24 1710 06/14/24 0905  NA 140   < > 143 146*  K 3.7   < > 4.1 3.4*  CL 104   < > 108 113*  CO2 20*   < > 15* 18*  GLUCOSE 83   < > 143* 121*  BUN 53*   < > 71* 72*  CREATININE 4.25*   < > 5.29* 3.87*  CALCIUM 8.5*   < > 8.5* 8.5*  HGB 8.8*  --   --   --   WBC 4.8   --   --   --    < > = values in this interval not displayed.     Studies/Results: IR NEPHROSTOMY PLACEMENT BILATERAL Result Date: 06/13/2024 INDICATION: Nephrostomy placement Briefly, 89 year old male with a history of prostate cancer with obstructing hydronephrosis. EXAM: ULTRASOUND AND FLUOROSCOPIC GUIDED PLACEMENT OF BILATERAL NEPHROSTOMY TUBES COMPARISON:  CT AP, 06/11/2024. MEDICATIONS: Rocephin 1 gm IV; The antibiotic was administered in an appropriate time frame prior to skin puncture. ANESTHESIA/SEDATION: Moderate (conscious) sedation was employed during this procedure. A total of Versed  1.5 mg and Fentanyl  50 mcg was administered intravenously. Moderate Sedation Time: 48 minutes. The patient's level of consciousness and vital signs were monitored continuously by radiology nursing throughout the procedure under my direct supervision. CONTRAST:  20 mL Isovue 300 - administered into the renal collecting system FLUOROSCOPY: Radiation Exposure Index and estimated peak skin dose (PSD); Reference air kerma (RAK), 14.4 mGy. COMPLICATIONS: None immediate. PROCEDURE: The procedure, risks, benefits, and alternatives were explained to the patient and/or patient's representative, questions were encouraged and answered and informed consent was obtained. A timeout was performed prior to the initiation of the procedure. The operative site was prepped and draped in the usual sterile fashion and a sterile drape was applied covering the operative field. A sterile gown and sterile gloves were used for the procedure.  Local anesthesia was provided with 1% Lidocaine  with epinephrine . The procedure began of the patient's LEFT side. Ultrasound was used to localize the LEFT kidney. Under direct ultrasound guidance, a 20 gauge needle was advanced into the renal collecting system. An ultrasound image documentation was performed. Access within the collecting system was confirmed with the efflux of urine followed by limited  contrast injection. Under intermittent fluoroscopic guidance, an 0.018 wire was advanced into the collecting system and the tract was dilated with an Accustick stent. Next, over a short Amplatz wire, the track was further dilated ultimately allowing placement of a 10 Fr percutaneous nephrostomy catheter with end coiled and locked within the LEFT renal pelvis. The procedure was then repeated on the patient's RIGHT side, allowing placement of a 10 Fr percutaneous nephrostomy catheter with end coiled and locked within the RIGHT renal pelvis. Contrast was injected and several spot fluoroscopic images were obtained in various obliquities. The catheters were secured at the skin entrance site with an interrupted suture and a stat lock device and connected to a gravity bag. Dressings were applied. The patient tolerated procedure well without immediate postprocedural complication. FINDINGS: *Ultrasound scanning demonstrates a moderately dilated BILATERAL collecting systems. *Under a combination of ultrasound and fluoroscopic guidance, a posterior inferior calices were targeted allowing placement of a 10 Fr percutaneous nephrostomy catheter with end coiled and locked within the renal pelves. Contrast injection confirmed appropriate positioning. *Small volume extraluminal extravasation about the RIGHT renal pelvis, similar appearance to comparison CT and suspicious for caliceal rupture. IMPRESSION: Successful ultrasound and fluoroscopic guided placement of a BILATERAL 10 Fr percutaneous nephrostomy drainage catheters. RECOMMENDATIONS: The patient will return to Vascular Interventional Radiology (VIR) for routine drainage catheter evaluation and exchange in 8 weeks. Todd Hall, MD [facility] Electronically Signed   By: Todd Holmes M.D.   On: 06/13/2024 16:44   DG C-Arm 1-60 Min-No Report Result Date: 06/13/2024 Fluoroscopy was utilized by the requesting physician.   No radiographic interpretation.      LOS: 2 days   Ole Bourdon, NP [facility] Pager: [phone]  06/14/2024, 1:00 PM

## 2024-06-14 NOTE — Progress Notes (Signed)
 Mobility Specialist - Progress Note    06/14/24 0906  Mobility  Activity Pivoted/transferred from bed to chair  Level of Assistance Minimal assist, patient does 75% or more  Assistive Device Front wheel walker  Distance Ambulated (ft) 3 ft  Activity Response Tolerated well  Mobility Referral Yes  Mobility visit 1 Mobility  Mobility Specialist Start Time (ACUTE ONLY) 0845  Mobility Specialist Stop Time (ACUTE ONLY) 0900  Mobility Specialist Time Calculation (min) (ACUTE ONLY) 15 min    Pt was received in bed and agreed to mobility. Min A from STS. Returned to chair with chair alarm on and all needs met. Call bell in reach.   [employer] - Mobility Specialist

## 2024-06-14 NOTE — Progress Notes (Signed)
 Patient ID: Todd Holmes, male   DOB: 1929/01/05, 89 y.o.   MRN: [id number]    Referring Physician(s): Dr. Renato Ghimire/Machen,G   Supervising Physician: Hughes Simmonds  Patient Status:  [facility] - In-pt  Chief Complaint:  Bilateral hydronephrosis due to obstructive prostate cancer; s/p bilateral PCN placement 06/13/24  Subjective:  Pt feeling improved today. Back pain better, slightly sore on the left flank at site of left PCN - reassured patient this will get better in coming days. Sitting up in chair, eating , says appetite is better today. States he was told he may be going to [facility] floor or other rehab facility.   R PCN last 24h, L PCN 350 mL last 24h  Allergies: Patient has no known allergies.  Medications: Prior to Admission medications   Medication Sig Start Date End Date Taking? Authorizing Provider  escitalopram  (LEXAPRO ) 10 MG tablet Take 10 mg by mouth in the morning. 05/07/22  Yes [provider]  melatonin 5 MG TABS Take 5 mg by mouth at bedtime as needed (for sleep).   Yes [provider]  Multiple Vitamin (MULTIVITAMIN) tablet Take 1 tablet by mouth daily with breakfast.   Yes [provider]  MYRBETRIQ  25 MG TB24 tablet Take 25 mg by mouth in the morning. 05/07/22  Yes [provider]  ondansetron  (ZOFRAN ) 4 MG tablet Take 1 tablet (4 mg total) by mouth every 6 (six) hours as needed for nausea. 02/24/24  Yes Cheryle Page, MD  TYLENOL  500 MG tablet Take 500 mg by mouth every 6 (six) hours as needed (for pain).   Yes [provider]  polyethylene glycol (MIRALAX  / GLYCOLAX ) 17 g packet Take 17 g by mouth daily. Patient not taking: Reported on 06/11/2024 02/25/24   Cheryle Page, MD     Vital Signs: BP (!) 141/84 (BP Location: Left Arm)   Pulse 92   Temp 98.1 F (36.7 C) (Oral)   Resp 16   Ht 5' 1 (1.549 m)   Wt 135 lb 2.3 oz (61.3 kg)   SpO2 96%   BMI 25.53 kg/m   Physical Exam Vitals and nursing  note reviewed.  Constitutional:      General: He is not in acute distress. Cardiovascular:     Rate and Rhythm: Normal rate.  Pulmonary:     Effort: Pulmonary effort is normal.  Abdominal:     Palpations: Abdomen is soft.  Skin:    General: Skin is warm and dry.     Comments: + mild ttp of left PCN. No overlying abnormality. Slightly blood tinged urine in bag  + R PCN with no ttp. Yellow urine in bag. No overlying abnormality.  Both dressed appropriately   Neurological:     Mental Status: He is alert and oriented to person, place, and time. Mental status is at baseline.     Imaging: IR NEPHROSTOMY PLACEMENT BILATERAL Result Date: 06/13/2024 INDICATION: Nephrostomy placement Briefly, 89 year old male with a history of prostate cancer with obstructing hydronephrosis. EXAM: ULTRASOUND AND FLUOROSCOPIC GUIDED PLACEMENT OF BILATERAL NEPHROSTOMY TUBES COMPARISON:  CT AP, 06/11/2024. MEDICATIONS: Rocephin 1 gm IV; The antibiotic was administered in an appropriate time frame prior to skin puncture. ANESTHESIA/SEDATION: Moderate (conscious) sedation was employed during this procedure. A total of Versed  1.5 mg and Fentanyl  50 mcg was administered intravenously. Moderate Sedation Time: 48 minutes. The patient's level of consciousness and vital signs were monitored continuously by radiology nursing throughout the procedure under my direct supervision. CONTRAST:  20 mL Isovue 300 - administered into the renal collecting system FLUOROSCOPY: Radiation Exposure Index and estimated peak skin dose (PSD); Reference air kerma (RAK), 14.4 mGy. COMPLICATIONS: None immediate. PROCEDURE: The procedure, risks, benefits, and alternatives were explained to the patient and/or patient's representative, questions were encouraged and answered and informed consent was obtained. A timeout was performed prior to the initiation of the procedure. The operative site was prepped and draped in the usual sterile fashion and a  sterile drape was applied covering the operative field. A sterile gown and sterile gloves were used for the procedure. Local anesthesia was provided with 1% Lidocaine  with epinephrine . The procedure began of the patient's LEFT side. Ultrasound was used to localize the LEFT kidney. Under direct ultrasound guidance, a 20 gauge needle was advanced into the renal collecting system. An ultrasound image documentation was performed. Access within the collecting system was confirmed with the efflux of urine followed by limited contrast injection. Under intermittent fluoroscopic guidance, an 0.018 wire was advanced into the collecting system and the tract was dilated with an Accustick stent. Next, over a short Amplatz wire, the track was further dilated ultimately allowing placement of a 10 Fr percutaneous nephrostomy catheter with end coiled and locked within the LEFT renal pelvis. The procedure was then repeated on the patient's RIGHT side, allowing placement of a 10 Fr percutaneous nephrostomy catheter with end coiled and locked within the RIGHT renal pelvis. Contrast was injected and several spot fluoroscopic images were obtained in various obliquities. The catheters were secured at the skin entrance site with an interrupted suture and a stat lock device and connected to a gravity bag. Dressings were applied. The patient tolerated procedure well without immediate postprocedural complication. FINDINGS: *Ultrasound scanning demonstrates a moderately dilated BILATERAL collecting systems. *Under a combination of ultrasound and fluoroscopic guidance, a posterior inferior calices were targeted allowing placement of a 10 Fr percutaneous nephrostomy catheter with end coiled and locked within the renal pelves. Contrast injection confirmed appropriate positioning. *Small volume extraluminal extravasation about the RIGHT renal pelvis, similar appearance to comparison CT and suspicious for caliceal rupture. IMPRESSION: Successful  ultrasound and fluoroscopic guided placement of a BILATERAL 10 Fr percutaneous nephrostomy drainage catheters. RECOMMENDATIONS: The patient will return to [facility] ([facility]) for routine drainage catheter evaluation and exchange in 8 weeks. Thom Hall, MD [facility] Radiology Electronically Signed   By: Thom Hall M.D.   On: 06/13/2024 16:44   DG C-Arm 1-60 Min-No Report Result Date: 06/13/2024 Fluoroscopy was utilized by the requesting physician.  No radiographic interpretation.   CT L-SPINE NO CHARGE Result Date: 06/11/2024 CLINICAL DATA:  left flank pain starting yesterday. Sent here by [facility] after UA dip. Has stage four metastatic prostate cancer and has been losing weight. Denies any current urinary symptoms. EXAM: CT LUMBAR SPINE WITHOUT CONTRAST TECHNIQUE: Multidetector CT imaging of the lumbar spine was performed without intravenous contrast administration. Multiplanar CT image reconstructions were also generated. RADIATION DOSE REDUCTION: This exam was performed according to the departmental dose-optimization program which includes automated exposure control, adjustment of the mA and/or kV according to patient size and/or use of iterative reconstruction technique. COMPARISON:  CT abdomen pelvis 02/20/2024 FINDINGS: Segmentation: 5 lumbar type vertebrae. Alignment: Similar-appearing levoscoliosis of the lumbar spine centered at the L3-L4 level. Vertebrae: Diffusely decreased bone density. Similar-appearing scattered blastic osseous metastasis of the lumbar spine. Redemonstration of lytic and blastic osseous lesions of the sacrum. No acute fracture or focal pathologic process. Paraspinal and  other soft tissues: Negative. Disc levels: Multilevel intervertebral disc space narrowing and vacuum phenomenon. IMPRESSION: 1. Similar-appearing scattered blastic osseous metastasis of the lumbar spine. Redemonstration of lytic and blastic  osseous lesions of the sacrum. 2. No acute displaced fracture or traumatic listhesis of the lumbar spine. 3. Diffusely decreased bone density. Electronically Signed   By: Morgane  Naveau M.D.   On: 06/11/2024 19:16   CT ABDOMEN PELVIS WO CONTRAST Result Date: 06/11/2024 CLINICAL DATA:  Abdominal pain, acute, nonlocalized left flank pain EXAM: CT ABDOMEN AND PELVIS WITHOUT CONTRAST TECHNIQUE: Multidetector CT imaging of the abdomen and pelvis was performed following the standard protocol without IV contrast. RADIATION DOSE REDUCTION: This exam was performed according to the departmental dose-optimization program which includes automated exposure control, adjustment of the mA and/or kV according to patient size and/or use of iterative reconstruction technique. COMPARISON:  CT abdomen pelvis 02/20/2024 FINDINGS: Lower chest: Trace left pleural effusion.  Tiny hiatal hernia. Hepatobiliary: No focal liver abnormality. No gallstones, gallbladder wall thickening, or pericholecystic fluid. No biliary dilatation. Pancreas: Diffuse coarse calcifications. Diffusely atrophic. No focal lesion. Otherwise normal pancreatic contour. No surrounding inflammatory changes. No main pancreatic ductal dilatation. ductal dilatation. Spleen: Normal in size without focal abnormality. Adrenals/Urinary Tract: No adrenal nodule bilaterally. Interval development of bilateral mild to moderate hydroureteronephrosis. No nephrolithiasis. No ureterolithiasis. The urinary bladder is unremarkable. Stomach/Bowel: Stomach is within normal limits. No evidence of bowel wall thickening or dilatation. Diffuse colonic diverticulosis. Appendix appears normal. Vascular/Lymphatic: Phleboliths noted along the pelvis. No abdominal aorta or iliac aneurysm. Moderate atherosclerotic plaque of the aorta and its branches. Difficult to measure on this noncontrast study persistent retroperitoneal and pelvic lymphadenopathy. Reproductive: Prostate is unremarkable with  radiation seeds noted. Other: Interval development of diffuse mild mesenteric edema. Interval development of trace volume intraperitoneal free fluid. No intraperitoneal free gas. No organized fluid collection. Musculoskeletal: Status post left inguinal hernia repair with mesh. No recurrent hernia. No abdominal wall hernia or abnormality. Lytic and blastic metastatic lesions of the sacrum again noted. Please see separately dictated CT lumbar spine 06/11/2024. No acute displaced fracture. IMPRESSION: 1. Interval development of bilateral mild to moderate hydroureteronephrosis. No nephroureterolithiasis bilaterally. This may reflect the changes of obstructive uropathy or reflux. 2. Trace left pleural effusion. 3. Interval development of mild mesenteric edema and trace volume simple free fluid ascites. 4. Tiny hiatal hernia. 5. Chronic pancreatitis with no findings of acute pancreatitis. 6. Colonic diverticulosis with no acute diverticulitis. 7. Difficult to measure on this noncontrast study persistent retroperitoneal and pelvic lymphadenopathy. Persistent lytic and blastic metastatic lesions in a patient with known prostate cancer. Electronically Signed   By: Morgane  Naveau M.D.   On: 06/11/2024 19:10   CT ABDOMEN PELVIS WO CONTRAST Addendum Date: 06/11/2024 ADDENDUM REPORT: 06/11/2024 17:49 ADDENDUM: PA Leita Chancy was notified to disregard this report for Mcgwire Dasaro due to incorrect images placed on patient's record by CT technologist. Critical Value/emergent results were called by telephone on 06/11/2024 at 5:43 pm to provider LEITA MATTER , who verbally acknowledged these results and is taking care of patient with images provided. New report will be generated within images are moved to the correct patient. Electronically Signed   By: Leita Birmingham M.D.   On: 06/11/2024 17:49   Addendum Date: 06/11/2024 ADDENDUM REPORT: 06/11/2024 17:31 ADDENDUM: Per CT technologist Summer Tanbouz the CT images placed on  patient's record is a for a different patient. Physicians will be notified of results and images placed in the correct patient's record.  Electronically Signed   By: Leita Birmingham M.D.   On: 06/11/2024 17:31   Addendum Date: 06/11/2024 ADDENDUM REPORT: 06/11/2024 16:01 ADDENDUM: Critical Value/emergent results were called by telephone at the time of interpretation on 06/11/2024 at 4:01 pm to provider LEITA CHANCY , who verbally acknowledged these results. Electronically Signed   By: Leita Birmingham M.D.   On: 06/11/2024 16:01   Result Date: 06/11/2024 CLINICAL DATA:  Left lower quadrant abdominal pain. Left flank pain starting yesterday. History of stage IV metastatic prostate cancer. EXAM: CT ABDOMEN AND PELVIS WITHOUT CONTRAST TECHNIQUE: Multidetector CT imaging of the abdomen and pelvis was performed following the standard protocol without IV contrast. RADIATION DOSE REDUCTION: This exam was performed according to the departmental dose-optimization program which includes automated exposure control, adjustment of the mA and/or kV according to patient size and/or use of iterative reconstruction technique. COMPARISON:  02/20/2024. FINDINGS: Lower chest: Multi-vessel coronary artery calcifications are noted. Mild atelectasis is present at the lung bases. Hepatobiliary: No focal liver abnormality is seen. No biliary ductal dilatation. Stones are seen within the gallbladder. Pancreas: Unremarkable. No pancreatic ductal dilatation or surrounding inflammatory changes. Spleen: Normal in size without focal abnormality. Adrenals/Urinary Tract: There is thickening of the adrenal glands bilaterally without evidence of discrete nodule., possible hyperplasia. A nonobstructive renal calculus is noted on the left. Arterial calcifications are also seen on the left. No hydronephrosis bilaterally. There is mild thickening of the anterior bladder wall with associated inflammatory changes. Stomach/Bowel: The stomach is within normal  limits. No bowel obstruction. There is pneumatosis involving a segment of small bowel the mid left abdomen, axial image 46 with associated foci of free air is in the left abdomen. There is a ventral abdominal wall hernia containing nonobstructed small bowel. The appendix is not well delineated. Vascular/Lymphatic: Aortic atherosclerosis. No enlarged abdominal or pelvic lymph nodes. Reproductive: Prostate gland is mildly enlarged. Other: No abdominopelvic ascites. Musculoskeletal: Degenerative changes are present in the thoracolumbar spine. Please see CT lumbar spine for additional information. IMPRESSION: 1. Segment of small bowel in the the left abdomen with pneumatosis and associated foci of free air, uncertain ideology and perforation cannot be excluded. Surgical consultation is recommended. 2. Bladder wall thickening with associated inflammatory changes, possible infectious or inflammatory cystitis. 3. Low anterior abdominal wall hernia containing nonobstructed small bowel. 4. Nonobstructive left renal calculus. 5. Aortic atherosclerosis. Electronically Signed: By: Leita Birmingham M.D. On: 06/11/2024 15:57   DG Chest Port 1 View Result Date: 06/11/2024 CLINICAL DATA:  Questionable sepsis-evaluate for abnormality. EXAM: PORTABLE CHEST 1 VIEW COMPARISON:  Radiographs 02/21/2024 and 09/11/2011. Abdominal CT 06/11/2024. FINDINGS: 1625 hours. Low lung volumes with mildly increased atelectasis at both lung bases. The heart size and mediastinal contours are stable with aortic and brachycephalic tortuosity. There is no confluent airspace disease, pneumothorax or significant pleural effusion. Thoracolumbar scoliosis without evidence of acute osseous abnormality. IMPRESSION: Low lung volumes with mildly increased bibasilar atelectasis. No evidence of pneumonia. Electronically Signed   By: Elsie Perone M.D.   On: 06/11/2024 16:57   CT L-SPINE NO CHARGE Result Date: 06/11/2024 CLINICAL DATA:  Left flank pain. Stage  IV metastatic prostate cancer. EXAM: CT LUMBAR SPINE WITHOUT CONTRAST TECHNIQUE: Multidetector CT imaging of the lumbar spine was performed without intravenous contrast administration. Multiplanar CT image reconstructions were also generated. RADIATION DOSE REDUCTION: This exam was performed according to the departmental dose-optimization program which includes automated exposure control, adjustment of the mA and/or kV according to patient size and/or use of iterative  reconstruction technique. COMPARISON:  None Available. FINDINGS: Segmentation: 5 lumbar type vertebrae. Alignment: Normal. Vertebrae: No acute fracture is seen. Osteopenia is noted. No focal lytic or destructive lesion is seen. Paraspinal and other soft tissues: No acute abnormality. Please see CT abdomen and pelvis for additional information. Disc levels: There is multilevel intervertebral disc space narrowing, degenerative endplate changes, disc herniations and facet arthropathy resulting in mild-to-moderate spinal canal and neural foraminal stenosis. There is severe spinal canal stenosis at L3-L4. IMPRESSION: 1. No acute fracture or lytic or destructive lesion. 2. Multilevel degenerative changes in the lumbar spine as described above. Electronically Signed   By: Leita Birmingham M.D.   On: 06/11/2024 16:05    Labs:  CBC: Recent Labs    04/06/24 1500 06/01/24 1454 06/11/24 1233 06/12/24 0425  WBC 2.7* 3.0* 4.8 4.8  HGB 8.9* 9.8* 10.1* 8.8*  HCT 27.3* 29.9* 31.5* 28.1*  PLT 110* 114* 105* 92*    COAGS: Recent Labs    02/21/24 0105 06/13/24 0905  INR 1.2 1.3*    BMP: Recent Labs    06/12/24 1709 06/13/24 0510 06/13/24 1710 06/14/24 0905  NA 142 142 143 146*  K 3.9 4.2 4.1 3.4*  CL 104 106 108 113*  CO2 17* 16* 15* 18*  GLUCOSE 157* 146* 143* 121*  BUN 57* 77* 71* 72*  CALCIUM 8.3* 8.5* 8.5* 8.5*  CREATININE 4.58* 5.22* 5.29* 3.87*  GFRNONAA 11* 10* 10* 14*    LIVER FUNCTION TESTS: Recent Labs    02/21/24 0328  04/06/24 1500 06/01/24 1454 06/11/24 1233  BILITOT 1.3* 0.4 0.5 0.9  AST 29 19 27  38  ALT 11 5 8 10   ALKPHOS 58 56 67 67  PROT 7.3 6.7 7.1 7.4  ALBUMIN 3.6 3.5 3.8 4.1    Assessment and Plan:  S/p bilateral PCN placed yesterday 06/13/24 for obstructive hydroureteronephrosis - drains appear well, mild soreness of left PCN site. No overlying abnormalities at either site - R PCN 795 mL, L PCN 350 mL last 24h - back pain improved, appetite better today - pt reports looking at going to [facility] or other rehab facility, unsure of when. Please notify IR prior to discharge to arrange proper follow up.  IR will continue to follow - please call with questions or concerns.    Electronically Signed: Kimble VEAR Clas, PA-C 06/14/2024, 2:13 PM   I spent a total of 15 Minutes at the the patient's bedside AND on the patient's hospital floor or unit, greater than 50% of which was counseling/coordinating care for bilateral PCN follow up

## 2024-06-14 NOTE — Progress Notes (Signed)
 PROGRESS NOTE    WESTEN DININO  FMW:[id number] DOB: 1928/12/27 DOA: 06/11/2024 PCP: Elliot Charm, MD    Brief Narrative:  89 year old with history of hypertension, metastatic prostate cancer presents to the emergency room with left flank pain, long history of dysuria, poor appetite.  In the emergency room hemodynamically stable.  He was found to have elevated creatinine from baseline.  Received 250 mL bolus fluid and admitted to the hospital. Patient continued have worsening renal functions and anuria.  Underwent bilateral nephrostomy tube placement.  Renal functions improving now.  Subjective: Patient seen and examined.  Denies any complaints.  Nephrostomy tube draining about 1500 mL urine.  Creatinine trending down.  Denies any excess pain.  Nausea is getting better.  Assessment & Plan:   Acute kidney injury: Baseline creatinine about 1.17.  Presented with a creatinine of 3.19-4.25-4.48-5.24. Potassium is normal.  CT scan consistent with bilateral hydronephrosis Foley catheter with anuria Seen by urology, bilateral nephrostomy tube placed with clinical improvement. Nephrology continues to follow.  On low-dose maintenance fluid today. Mobilize with PT OT.  Essential hypertension blood pressures elevated.  Started on amlodipine .  Pressures improving.   Metastatic prostate cancer: Symptomatic management.  Mobilize with PT OT.  Nephrostomy tube care.  Refer to a SNF for rehab.    DVT prophylaxis: heparin injection 5,000 Units Start: 06/14/24 0600   Code Status: DNR/DNI Family Communication: None today. Disposition Plan: Status is: Inpatient.  Inpatient procedures.  Needs SNF.   Consultants:  Nephrology Urology Interventional radiology  Procedures:  Bilateral nephrostomy.  Antimicrobials:  None     Objective: Vitals:   06/13/24 1743 06/13/24 2203 06/14/24 0500 06/14/24 0618  BP: (!) 144/87 (!) 152/80  (!) 161/90  Pulse: 84 94  (!) 104  Resp: 16 15   17   Temp: (!) 97.5 F (36.4 C) 98 F (36.7 C)  98.2 F (36.8 C)  TempSrc: Oral Oral  Oral  SpO2: 100% 94%  95%  Weight:   61.3 kg   Height:        Intake/Output Summary (Last 24 hours) at 06/14/2024 1209 Last data filed at 06/14/2024 1000 Gross per 24 hour  Intake 1688.96 ml  Output 2095 ml  Net -406.04 ml   Filed Weights   06/11/24 2101 06/12/24 1559 06/14/24 0500  Weight: 52.8 kg 59.8 kg 61.3 kg    Examination:  General exam: Appears calm and comfortable.  Frail. Respiratory system: Clear to auscultation. Respiratory effort normal. Cardiovascular system: S1 & S2 heard, RRR. No JVD, murmurs, rubs, gallops or clicks. No pedal edema. Gastrointestinal system: Abdomen is nondistended, soft and nontender. No organomegaly or masses felt. Normal bowel sounds heard. Right-sided nephrostomy tube with clear urine Left-sided nephrostomy with some blood mixed urine. Central nervous system: Alert and oriented. No focal neurological deficits.  Generalized weakness. Extremities: Symmetric 5 x 5 power. Skin: No rashes, lesions or ulcers Psychiatry: Judgement and insight appear normal. Mood & affect appropriate.     Data Reviewed: I have personally reviewed following labs and imaging studies  CBC: Recent Labs  Lab 06/11/24 1233 06/12/24 0425  WBC 4.8 4.8  NEUTROABS 3.9  --   HGB 10.1* 8.8*  HCT 31.5* 28.1*  MCV 93.8 93.4  PLT 105* 92*   Basic Metabolic Panel: Recent Labs  Lab 06/12/24 0939 06/12/24 1709 06/13/24 0510 06/13/24 1710 06/14/24 0905  NA 140 142 142 143 146*  K 3.8 3.9 4.2 4.1 3.4*  CL 105 104 106 108 113*  CO2 19* 17*  16* 15* 18*  GLUCOSE 101* 157* 146* 143* 121*  BUN 54* 57* 77* 71* 72*  CREATININE 4.48* 4.58* 5.22* 5.29* 3.87*  CALCIUM 8.6* 8.3* 8.5* 8.5* 8.5*   GFR: Estimated Creatinine Clearance: 8.8 mL/min (A) (by C-G formula based on SCr of 3.87 mg/dL (H)). Liver Function Tests: Recent Labs  Lab 06/11/24 1233  AST 38  ALT 10  ALKPHOS 67   BILITOT 0.9  PROT 7.4  ALBUMIN 4.1   No results for input(s): LIPASE, AMYLASE in the last 168 hours. No results for input(s): AMMONIA in the last 168 hours. Coagulation Profile: Recent Labs  Lab 06/13/24 0905  INR 1.3*   Cardiac Enzymes: No results for input(s): CKTOTAL, CKMB, CKMBINDEX, TROPONINI in the last 168 hours. BNP (last 3 results) No results for input(s): PROBNP in the last 8760 hours. HbA1C: No results for input(s): HGBA1C in the last 72 hours. CBG: Recent Labs  Lab 06/12/24 2135 06/13/24 1406  GLUCAP 173* 127*   Lipid Profile: No results for input(s): CHOL, HDL, LDLCALC, TRIG, CHOLHDL, LDLDIRECT in the last 72 hours. Thyroid  Function Tests: No results for input(s): TSH, T4TOTAL, FREET4, T3FREE, THYROIDAB in the last 72 hours. Anemia Panel: No results for input(s): VITAMINB12, FOLATE, FERRITIN, TIBC, IRON, RETICCTPCT in the last 72 hours. Sepsis Labs: No results for input(s): PROCALCITON, LATICACIDVEN in the last 168 hours.  No results found for this or any previous visit (from the past 240 hours).       Radiology Studies: IR NEPHROSTOMY PLACEMENT BILATERAL Result Date: 06/13/2024 INDICATION: Nephrostomy placement 89 year old male with a history of prostate cancer with obstructing hydronephrosis. EXAM: ULTRASOUND AND FLUOROSCOPIC GUIDED PLACEMENT OF BILATERAL NEPHROSTOMY TUBES COMPARISON:  CT AP, 06/11/2024. MEDICATIONS: Rocephin 1 gm IV; The antibiotic was administered in an appropriate time frame prior to skin puncture. ANESTHESIA/SEDATION: Moderate (conscious) sedation was employed during this procedure. A total of Versed  1.5 mg and Fentanyl  50 mcg was administered intravenously. Moderate Sedation Time: 48 minutes. The patient's level of consciousness and vital signs were monitored continuously by radiology nursing throughout the procedure under my direct supervision. CONTRAST:  20 mL Isovue 300  - administered into the renal collecting system FLUOROSCOPY: Radiation Exposure Index and estimated peak skin dose (PSD); Reference air kerma (RAK), 14.4 mGy. COMPLICATIONS: None immediate. PROCEDURE: The procedure, risks, benefits, and alternatives were explained to the patient and/or patient's representative, questions were encouraged and answered and informed consent was obtained. A timeout was performed prior to the initiation of the procedure. The operative site was prepped and draped in the usual sterile fashion and a sterile drape was applied covering the operative field. A sterile gown and sterile gloves were used for the procedure. Local anesthesia was provided with 1% Lidocaine  with epinephrine . The procedure began of the patient's LEFT side. Ultrasound was used to localize the LEFT kidney. Under direct ultrasound guidance, a 20 gauge needle was advanced into the renal collecting system. An ultrasound image documentation was performed. Access within the collecting system was confirmed with the efflux of urine followed by limited contrast injection. Under intermittent fluoroscopic guidance, an 0.018 wire was advanced into the collecting system and the tract was dilated with an Accustick stent. Next, over a short Amplatz wire, the track was further dilated ultimately allowing placement of a 10 Fr percutaneous nephrostomy catheter with end coiled and locked within the LEFT renal pelvis. The procedure was then repeated on the patient's RIGHT side, allowing placement of a 10 Fr percutaneous nephrostomy catheter with end coiled and  locked within the RIGHT renal pelvis. Contrast was injected and several spot fluoroscopic images were obtained in various obliquities. The catheters were secured at the skin entrance site with an interrupted suture and a stat lock device and connected to a gravity bag. Dressings were applied. The patient tolerated procedure well without immediate postprocedural complication. FINDINGS:  *Ultrasound scanning demonstrates a moderately dilated BILATERAL collecting systems. *Under a combination of ultrasound and fluoroscopic guidance, a posterior inferior calices were targeted allowing placement of a 10 Fr percutaneous nephrostomy catheter with end coiled and locked within the renal pelves. Contrast injection confirmed appropriate positioning. *Small volume extraluminal extravasation about the RIGHT renal pelvis, similar appearance to comparison CT and suspicious for caliceal rupture. IMPRESSION: Successful ultrasound and fluoroscopic guided placement of a BILATERAL 10 Fr percutaneous nephrostomy drainage catheters. RECOMMENDATIONS: The patient will return to Vascular Interventional Radiology (VIR) for routine drainage catheter evaluation and exchange in 8 weeks. Thom Hall, MD [facility] Electronically Signed   By: Thom Hall M.D.   On: 06/13/2024 16:44   DG C-Arm 1-60 Min-No Report Result Date: 06/13/2024 Fluoroscopy was utilized by the requesting physician.  No radiographic interpretation.        Scheduled Meds:  amLODipine   5 mg Oral Daily   Chlorhexidine  Gluconate Cloth  6 each Topical Daily   escitalopram   10 mg Oral Daily   heparin injection (subcutaneous)  5,000 Units Subcutaneous Q8H   mirabegron  ER  25 mg Oral Daily   sodium chloride  flush  5 mL Intracatheter Q8H   Continuous Infusions:  sodium chloride  50 mL/hr at 06/14/24 0221   promethazine (PHENERGAN) injection (IM or IVPB) 12.5 mg (06/12/24 1337)     LOS: 2 days    Time spent: 55 minutes    Renato Applebaum, MD [facility]

## 2024-06-14 NOTE — Plan of Care (Signed)
   Problem: Clinical Measurements: Goal: Diagnostic test results will improve Outcome: Progressing

## 2024-06-14 NOTE — TOC Progression Note (Addendum)
 Transition of Care [facility]) - Progression Note    Patient Details  Name: Todd Holmes MRN: [id number] Date of Birth: 05-05-1929  [facility]) CM/SW Contact  Alfonse JONELLE Rex, RN Phone Number: 06/14/2024, 2:40 PM  Clinical Narrative: Met with patient at bedside to introduce role of INPT Care Mgmt and review for dc planning, PT recommendation for short term rehab/SNF, pt agreeable, NCM called to pt's cousin, Bernice Beat, agreeable with SNF, prefers [place].  Per Bernice she will be leaving the county to go to [place]  10/6, return 10/12, attending a funeral. NCM will notify SNF.  FL2 updated, Referral to [facility] for short term rehab/SNF, await bed offer.       Expected Discharge Plan: Skilled Nursing Facility Barriers to Discharge: Continued Medical Work up               Expected Discharge Plan and Services       Living arrangements for the past 2 months: Single Family Home                                       Social Drivers of Health (SDOH) Interventions SDOH Screenings   Food Insecurity: No Food Insecurity (06/11/2024)  Housing: Low Risk  (06/11/2024)  Transportation Needs: No Transportation Needs (06/11/2024)  Utilities: Not At Risk (06/11/2024)  Depression (PHQ2-9): Low Risk  (04/06/2024)  Social Connections: Unknown (06/12/2024)  Tobacco Use: Low Risk  (06/12/2024)    Readmission Risk Interventions    06/14/2024    2:38 PM 02/24/2024    9:53 AM 02/21/2024    1:28 PM  Readmission Risk Prevention Plan  Post Dischage Appt   Complete  Medication Screening   Complete  Transportation Screening Complete Complete Complete  PCP or Specialist Appt within 5-7 Days  Complete   PCP or Specialist Appt within 3-5 Days Complete    Home Care Screening  Complete   Medication Review (RN CM)  Complete   HRI or Home Care Consult Complete    Social Work Consult for Recovery Care Planning/Counseling Complete    Palliative Care Screening Not Applicable     Medication Review Oceanographer) Complete

## 2024-06-15 ENCOUNTER — Other Ambulatory Visit: Payer: Self-pay | Admitting: Physician Assistant

## 2024-06-15 DIAGNOSIS — N179 Acute kidney failure, unspecified: Secondary | ICD-10-CM | POA: Diagnosis not present

## 2024-06-15 DIAGNOSIS — Z192 Hormone resistant malignancy status: Secondary | ICD-10-CM

## 2024-06-15 LAB — BASIC METABOLIC PANEL WITH GFR
Anion gap: 11 (ref 5–15)
BUN: 48 mg/dL — ABNORMAL HIGH (ref 8–23)
CO2: 21 mmol/L — ABNORMAL LOW (ref 22–32)
Calcium: 8 mg/dL — ABNORMAL LOW (ref 8.9–10.3)
Chloride: 116 mmol/L — ABNORMAL HIGH (ref 98–111)
Creatinine, Ser: 1.84 mg/dL — ABNORMAL HIGH (ref 0.61–1.24)
GFR, Estimated: 34 mL/min — ABNORMAL LOW (ref >60)
Glucose, Bld: 80 mg/dL (ref 70–99)
Potassium: 3.1 mmol/L — ABNORMAL LOW (ref 3.5–5.1)
Sodium: 148 mmol/L — ABNORMAL HIGH (ref 135–145)

## 2024-06-15 LAB — MAGNESIUM: Magnesium: 2.3 mg/dL (ref 1.7–2.4)

## 2024-06-15 LAB — PHOSPHORUS: Phosphorus: 2.5 mg/dL (ref 2.5–4.6)

## 2024-06-15 MED ORDER — DEXTROSE 5 % IV SOLN: INTRAVENOUS | Status: DC

## 2024-06-15 MED ORDER — BACITRACIN-POLYMYXIN B 500-10000 UNIT/GM OP OINT
TOPICAL_OINTMENT | Freq: Two times a day (BID) | OPHTHALMIC | Status: DC
  Filled 2024-06-15: qty 3.5

## 2024-06-15 MED ORDER — POTASSIUM CHLORIDE CRYS ER 20 MEQ PO TBCR
40.0000 meq | EXTENDED_RELEASE_TABLET | Freq: Once | ORAL | Status: AC
  Administered 2024-06-15: 40 meq via ORAL
  Filled 2024-06-15: qty 2

## 2024-06-15 NOTE — TOC Progression Note (Addendum)
 Transition of Care [facility]) - Progression Note    Patient Details  Name: Todd Holmes MRN: [id number] Date of Birth: 1928/10/17  Transition [facility]) CM/SW Contact  Alfonse JONELLE Rex, RN Phone Number: 06/15/2024, 8:20 AM  Clinical Narrative:  SNF auth initiated via [employer]. Auth ID [id number], shara pending.  -11:18am SNF auth approved.  Plan Auth ID [id number], shara [id number]. Days approved 10/2 to 10/6, team notified.     Expected Discharge Plan: Skilled Nursing Facility Barriers to Discharge: Continued Medical Work up               Expected Discharge Plan and Services       Living arrangements for the past 2 months: Single Family Home                                       Social Drivers of Health (SDOH) Interventions SDOH Screenings   Food Insecurity: No Food Insecurity (06/11/2024)  Housing: Low Risk  (06/11/2024)  Transportation Needs: No Transportation Needs (06/11/2024)  Utilities: Not At Risk (06/11/2024)  Depression (PHQ2-9): Low Risk  (04/06/2024)  Social Connections: Unknown (06/12/2024)  Tobacco Use: Low Risk  (06/12/2024)    Readmission Risk Interventions    06/14/2024    2:38 PM 02/24/2024    9:53 AM 02/21/2024    1:28 PM  Readmission Risk Prevention Plan  Post Dischage Appt   Complete  Medication Screening   Complete  Transportation Screening Complete Complete Complete  PCP or Specialist Appt within 5-7 Days  Complete   PCP or Specialist Appt within 3-5 Days Complete    Home Care Screening  Complete   Medication Review (RN CM)  Complete   HRI or Home Care Consult Complete    Social Work Consult for Recovery Care Planning/Counseling Complete    Palliative Care Screening Not Applicable    Medication Review Oceanographer) Complete

## 2024-06-15 NOTE — Progress Notes (Signed)
  Subjective: No complaints. Tolerating PCNs.  Objective: Vital signs in last 24 hours: Temp:  [97.9 F (36.6 C)-98.8 F (37.1 C)] 98.8 F (37.1 C) (10/02 1327) Pulse Rate:  [76-88] 86 (10/02 1327) Resp:  [14-19] 14 (10/02 0808) BP: (118-139)/(62-76) 118/62 (10/02 1327) SpO2:  [96 %-97 %] 97 % (10/02 1327) Weight:  [61.5 kg] 61.5 kg (10/02 0709)  Intake/Output from previous day: 10/01 0701 - 10/02 0700 In: 1894.7 [P.O.:660; I.V.:1214.7] Out: 2425 [Urine:2425] Intake/Output this shift: Total I/O In: 1064.4 [P.O.:540; I.V.:524.4] Out: 550 [Urine:550]  Physical Exam:  General: Alert and oriented CV: RRR Lungs: Clear Abdomen: Soft, ND, NT Ext: NT, No erythema  Lab Results: No results for input(s): HGB, HCT in the last 72 hours. BMET Recent Labs    06/14/24 0905 06/15/24 0431  NA 146* 148*  K 3.4* 3.1*  CL 113* 116*  CO2 18* 21*  GLUCOSE 121* 80  BUN 72* 48*  CREATININE 3.87* 1.84*  CALCIUM 8.5* 8.0*     Studies/Results: No results found.  Assessment/Plan: # Stage IV prostate cancer # Bilateral ureteral obstruction w/ hydronephrosis # ARF # Nausea   Bilateral PCNT placed for bilateral ureteral obstruction 2/2 prostate cancer.  Patient tolerating well with excellent UOP.  Clear yellow urine on the right and tea colored on the left.  These will likely be long-term palliation.  Follow-up with interventional radiology for scheduled exchanges.   Continue to trend labs. Creatinine continues to improve.   Okay to discharge from urologic perspective.  Please call with questions   LOS: 3 days   Matt R. Rhapsody Wolven MD 06/15/2024, 5:51 PM [facility]  Pager: [phone]

## 2024-06-15 NOTE — Progress Notes (Signed)
 PROGRESS NOTE    Todd Holmes  FMW:[id number] DOB: 1928-10-25 DOA: 06/11/2024 PCP: Elliot Charm, MD    Brief Narrative:  89 year old with history of hypertension, metastatic prostate cancer presents to the emergency room with left flank pain, long history of dysuria, poor appetite.  In the emergency room hemodynamically stable.  He was found to have elevated creatinine from baseline.  Received 250 mL bolus fluid and admitted to the hospital.  Subjective: Patient seen and examined.  Patient was addictability on the recliner.  Denied any active issues, no nausea vomiting or diarrhea, no abdominal pain.   Assessment & Plan:   Acute kidney injury: Baseline creatinine about 1.17.  Presented with a creatinine of 3.19-4.25-4.48-5.24. Potassium is normal.  Currently no indication for urgent hemodialysis. Continues to have nausea probably due to uremia. Adequate symptom control.  Low-dose maintenance IV fluids. Continue Foley catheter today for decompression. S/p Bilateral nephrostomy tube by IR don eon 9/30, urology also following. Continue close monitoring. Nephrology was following, signed off on 10/2 sCr 5.24>1.84 gradually improving   # Hypernatremia, mild Na 148 Started D5w as per nephrology Check BMP daily   # Hypokalemia, potassium repleted. Check electrolytes daily and replete as needed.   # Essential hypertension blood pressures elevated.  Started on amlodipine .  Also using hydralazine.  Will add scheduled hydralazine along with amlodipine .  As needed blood pressure medications.  Avoid sudden drop.   Metastatic prostate cancer: Symptomatic management.   # Conjunctivitis, eyes looks red and left eye crusting noticed on 10/2. Started Neosporin ointment   DVT prophylaxis: heparin injection 5,000 Units Start: 06/14/24 0600   Code Status: DNR/DNI Family Communication: None at the bedside.  Surgery communicated with the family. Disposition Plan: Status is:  Inpatient.  Inpatient procedures.   Consultants:  Nephrology Urology Interventional radiology  Procedures:  S/p bilateral nephrostomy tube by IR done on 9/30.  Antimicrobials:  None     Objective: Vitals:   06/15/24 0618 06/15/24 0709 06/15/24 0808 06/15/24 1327  BP: 120/67  136/76 118/62  Pulse: 88  85 86  Resp: 16  14   Temp: 98.7 F (37.1 C)   98.8 F (37.1 C)  TempSrc: Oral   Oral  SpO2: 97%  96% 97%  Weight:  61.5 kg    Height:        Intake/Output Summary (Last 24 hours) at 06/15/2024 1526 Last data filed at 06/15/2024 1410 Gross per 24 hour  Intake 2189.84 ml  Output 1475 ml  Net 714.84 ml   Filed Weights   06/12/24 1559 06/14/24 0500 06/15/24 0709  Weight: 59.8 kg 61.3 kg 61.5 kg    Examination:  General exam: Appears calm and comfortable.  Slightly anxious today. Respiratory system: Clear to auscultation. Respiratory effort normal. Cardiovascular system: S1 & S2 heard, RRR. No JVD, murmurs, rubs, gallops or clicks. No pedal edema. Gastrointestinal system: Abdomen is nondistended, soft and nontender. No organomegaly or masses felt. Normal bowel sounds heard. Foley catheter with trace urine.  Looks clear. Central nervous system: Alert and oriented. No focal neurological deficits.  Generalized weakness. Extremities: Symmetric 5 x 5 power. Skin: No rashes, lesions or ulcers Psychiatry: Judgement and insight appear normal. Mood & affect appropriate.     Data Reviewed: I have personally reviewed following labs and imaging studies  CBC: Recent Labs  Lab 06/11/24 1233 06/12/24 0425  WBC 4.8 4.8  NEUTROABS 3.9  --   HGB 10.1* 8.8*  HCT 31.5* 28.1*  MCV 93.8 93.4  PLT  105* 92*   Basic Metabolic Panel: Recent Labs  Lab 06/12/24 1709 06/13/24 0510 06/13/24 1710 06/14/24 0905 06/15/24 0431  NA 142 142 143 146* 148*  K 3.9 4.2 4.1 3.4* 3.1*  CL 104 106 108 113* 116*  CO2 17* 16* 15* 18* 21*  GLUCOSE 157* 146* 143* 121* 80  BUN 57* 77* 71*  72* 48*  CREATININE 4.58* 5.22* 5.29* 3.87* 1.84*  CALCIUM 8.3* 8.5* 8.5* 8.5* 8.0*  MG  --   --   --   --  2.3  PHOS  --   --   --   --  2.5   GFR: Estimated Creatinine Clearance: 18.6 mL/min (A) (by C-G formula based on SCr of 1.84 mg/dL (H)). Liver Function Tests: Recent Labs  Lab 06/11/24 1233  AST 38  ALT 10  ALKPHOS 67  BILITOT 0.9  PROT 7.4  ALBUMIN 4.1   No results for input(s): LIPASE, AMYLASE in the last 168 hours. No results for input(s): AMMONIA in the last 168 hours. Coagulation Profile: Recent Labs  Lab 06/13/24 0905  INR 1.3*   Cardiac Enzymes: No results for input(s): CKTOTAL, CKMB, CKMBINDEX, TROPONINI in the last 168 hours. BNP (last 3 results) No results for input(s): PROBNP in the last 8760 hours. HbA1C: No results for input(s): HGBA1C in the last 72 hours. CBG: Recent Labs  Lab 06/12/24 2135 06/13/24 1406  GLUCAP 173* 127*   Lipid Profile: No results for input(s): CHOL, HDL, LDLCALC, TRIG, CHOLHDL, LDLDIRECT in the last 72 hours. Thyroid  Function Tests: No results for input(s): TSH, T4TOTAL, FREET4, T3FREE, THYROIDAB in the last 72 hours. Anemia Panel: No results for input(s): VITAMINB12, FOLATE, FERRITIN, TIBC, IRON, RETICCTPCT in the last 72 hours. Sepsis Labs: No results for input(s): PROCALCITON, LATICACIDVEN in the last 168 hours.  No results found for this or any previous visit (from the past 240 hours).       Radiology Studies: DG C-Arm 1-60 Min-No Report Result Date: 06/13/2024 Fluoroscopy was utilized by the requesting physician.  No radiographic interpretation.        Scheduled Meds:  amLODipine   5 mg Oral Daily   Chlorhexidine  Gluconate Cloth  6 each Topical Daily   escitalopram   10 mg Oral Daily   heparin injection (subcutaneous)  5,000 Units Subcutaneous Q8H   mirabegron  ER  25 mg Oral Daily   sodium chloride  flush  5 mL Intracatheter Q8H   Continuous  Infusions:  dextrose 75 mL/hr at 06/15/24 1410   promethazine (PHENERGAN) injection (IM or IVPB) 12.5 mg (06/12/24 1337)     LOS: 3 days    Time spent: 40 minutes    Elvan Sor, MD [facility]

## 2024-06-15 NOTE — Progress Notes (Signed)
 [facility] Progress Note  Subjective:  Good UOP 2.6 L yesterday Creat down to 1.8 today Na+ up to 148 this am  Vitals:   06/14/24 2229 06/15/24 0618 06/15/24 0709 06/15/24 0808  BP: 139/71 120/67  136/76  Pulse: 76 88  85  Resp: 19 16  14   Temp: 97.9 F (36.6 C) 98.7 F (37.1 C)    TempSrc: Oral Oral    SpO2: 97% 97%  96%  Weight:   61.5 kg   Height:        Exam: Gen alert, no distress, on RA Chest clear bilat to bases RRR no MRG Abd soft ntnd no mass or ascites +bs Ext mild ankle edema bilat Neuro is alert, Ox 3 , nf     Home bp meds: none   Date                             Creat               eGFR (ml/min) 2012- July 20250.87- 1.13> 60 ml/min 06/01/24                        1.17 06/11/24                        3.19 06/12/24                        4.25          UA 9/28-  large Hb, neg protein, > 50 rbcs, 0-5 wbc/epi CT abd 9/28 noncontrast - Urinary Tract:Interval development of bilateral mild to moderate hydroureteronephrosis. No nephrolithiasis. No ureterolithiasis.The urinary bladder is unremarkable. Na+ 140  K+ 3.8  bun 54, creat 4.5   Hb 8.8  plt 92  WBC 4K.        Assessment/ Plan: Acute renal failure: b/l from sept 2025 was 1.17, egfr > 60 ml/min. Creatinine on admission was 3.1 yest and 4.2 today ths in the setting of bilat flank pain and stage IV prostate cancer. CT showed bilat new hydronephrosis 9/28. UA showed hematuria, no proteinuria or wbc's. Creat did not improve after IVFs and foley cath placement. Suspected AKI is due to obstruction. IR placed bilat PCN tubes on 9/30. UOP has been very good and creat is down to 1.8 this am. AKI recovering, no need for renal f/u. Will sign off.  Hypernatremia: Na+ 148 this am, I dc'd the LR at 50/hr and added D5W at 75 cc/hr. PMD can adjust this depending on f/u labs.  Stage IV prostate cancer HTN: not on any BP meds at home        Myer Fret MD  [facility] 06/15/2024, 10:17 AM  Recent Labs  Lab  06/11/24 1233 06/12/24 0425 06/12/24 0939 06/14/24 0905 06/15/24 0431  HGB 10.1* 8.8*  --   --   --   ALBUMIN 4.1  --   --   --   --   CALCIUM 9.2 8.5*   < > 8.5* 8.0*  CREATININE 3.19* 4.25*   < > 3.87* 1.84*  K 3.7 3.7   < > 3.4* 3.1*   < > = values in this interval not displayed.   No results for input(s): IRON, TIBC, FERRITIN in the last 168 hours. Inpatient medications:  amLODipine   5 mg Oral Daily   Chlorhexidine  Gluconate Cloth  6 each Topical Daily   escitalopram   10 mg Oral Daily   heparin injection (subcutaneous)  5,000 Units Subcutaneous Q8H   mirabegron  ER  25 mg Oral Daily   sodium chloride  flush  5 mL Intracatheter Q8H    dextrose 75 mL/hr at 06/15/24 0959   promethazine (PHENERGAN) injection (IM or IVPB) 12.5 mg (06/12/24 1337)   acetaminophen  **OR** acetaminophen , albuterol, hydrALAZINE, HYDROmorphone (DILAUDID) injection, ondansetron  **OR** ondansetron  (ZOFRAN ) IV, promethazine (PHENERGAN) injection (IM or IVPB), traMADol , traZODone

## 2024-06-16 DIAGNOSIS — N179 Acute kidney failure, unspecified: Secondary | ICD-10-CM | POA: Diagnosis not present

## 2024-06-16 LAB — BASIC METABOLIC PANEL WITH GFR
Anion gap: 8 (ref 5–15)
BUN: 29 mg/dL — ABNORMAL HIGH (ref 8–23)
CO2: 23 mmol/L (ref 22–32)
Calcium: 7.7 mg/dL — ABNORMAL LOW (ref 8.9–10.3)
Chloride: 110 mmol/L (ref 98–111)
Creatinine, Ser: 1.07 mg/dL (ref 0.61–1.24)
GFR, Estimated: >60 mL/min (ref >60)
Glucose, Bld: 110 mg/dL — ABNORMAL HIGH (ref 70–99)
Potassium: 3.3 mmol/L — ABNORMAL LOW (ref 3.5–5.1)
Sodium: 142 mmol/L (ref 135–145)

## 2024-06-16 LAB — CBC
HCT: 23.7 % — ABNORMAL LOW (ref 39.0–52.0)
Hemoglobin: 7.6 g/dL — ABNORMAL LOW (ref 13.0–17.0)
MCH: 29.6 pg (ref 26.0–34.0)
MCHC: 32.1 g/dL (ref 30.0–36.0)
MCV: 92.2 fL (ref 80.0–100.0)
Platelets: 77 K/uL — ABNORMAL LOW (ref 150–400)
RBC: 2.57 MIL/uL — ABNORMAL LOW (ref 4.22–5.81)
RDW: 14.6 % (ref 11.5–15.5)
WBC: 3.8 K/uL — ABNORMAL LOW (ref 4.0–10.5)
nRBC: 0.5 % — ABNORMAL HIGH (ref 0.0–0.2)

## 2024-06-16 LAB — MAGNESIUM: Magnesium: 2 mg/dL (ref 1.7–2.4)

## 2024-06-16 LAB — PHOSPHORUS: Phosphorus: 1.7 mg/dL — ABNORMAL LOW (ref 2.5–4.6)

## 2024-06-16 MED ORDER — POTASSIUM PHOSPHATES 15 MMOLE/5ML IV SOLN
30.0000 mmol | Freq: Once | INTRAVENOUS | Status: AC
  Administered 2024-06-16: 30 mmol via INTRAVENOUS
  Filled 2024-06-16: qty 10

## 2024-06-16 NOTE — Progress Notes (Signed)
 Referring Physician(s): Dr. Renato Applebaum, Dr. Donnice Siad  Supervising Physician: Hughes Simmonds  Patient Status:  [facility] - In-pt  Chief Complaint: Bilateral hydronephrosis due to obstructive prostate cancer; s/p bilateral PCN placement 06/13/24 by Dr Hughes  Subjective: Pt sitting in chair. Reports improved back pain overall, but does report mild discomfort on left flank at site of left PCN. States he will be going to rehab whenever he is discharged from the hospital.   Left PCN output: 400 mL in last 24 hours Right PCN output: 550 mL in last 24 hours   Allergies: Patient has no known allergies.  Medications: Prior to Admission medications   Medication Sig Start Date End Date Taking? Authorizing Provider  escitalopram  (LEXAPRO ) 10 MG tablet Take 10 mg by mouth in the morning. 05/07/22  Yes [provider]  melatonin 5 MG TABS Take 5 mg by mouth at bedtime as needed (for sleep).   Yes [provider]  Multiple Vitamin (MULTIVITAMIN) tablet Take 1 tablet by mouth daily with breakfast.   Yes [provider]  MYRBETRIQ  25 MG TB24 tablet Take 25 mg by mouth in the morning. 05/07/22  Yes [provider]  ondansetron  (ZOFRAN ) 4 MG tablet Take 1 tablet (4 mg total) by mouth every 6 (six) hours as needed for nausea. 02/24/24  Yes Cheryle Page, MD  TYLENOL  500 MG tablet Take 500 mg by mouth every 6 (six) hours as needed (for pain).   Yes [provider]  polyethylene glycol (MIRALAX  / GLYCOLAX ) 17 g packet Take 17 g by mouth daily. Patient not taking: Reported on 06/11/2024 02/25/24   Cheryle Page, MD     Vital Signs: BP 125/81 (BP Location: Left Arm)   Pulse 79   Temp 98.4 F (36.9 C)   Resp 18   Ht 5' 1 (1.549 m)   Wt 130 lb 8.2 oz (59.2 kg)   SpO2 97%   BMI 24.66 kg/m   Physical Exam Cardiovascular:     Rate and Rhythm: Normal rate and regular rhythm.  Pulmonary:     Effort: Pulmonary effort is normal. No respiratory distress.      Breath sounds: Normal breath sounds.  Abdominal:     General: Bowel sounds are normal.     Palpations: Abdomen is soft.  Musculoskeletal:     Right lower leg: Edema present.     Left lower leg: Edema present.  Skin:    General: Skin is warm and dry.     Comments: Bilateral PCN Left PCN - mild tenderness to palpation at tube insertion site; blood-tinged output in bag Right PCN - no tenderness to palpation; blood-tinged output in bag Both sites dressed appropriately  Neurological:     Mental Status: He is alert and oriented to person, place, and time.  Psychiatric:        Mood and Affect: Mood normal.        Behavior: Behavior normal.    Imaging: IR NEPHROSTOMY PLACEMENT BILATERAL Result Date: 06/13/2024 INDICATION: Nephrostomy placement Briefly, 89 year old male with a history of prostate cancer with obstructing hydronephrosis. EXAM: ULTRASOUND AND FLUOROSCOPIC GUIDED PLACEMENT OF BILATERAL NEPHROSTOMY TUBES COMPARISON:  CT AP, 06/11/2024. MEDICATIONS: Rocephin 1 gm IV; The antibiotic was administered in an appropriate time frame prior to skin puncture. ANESTHESIA/SEDATION: Moderate (conscious) sedation was employed during this procedure. A total of Versed  1.5 mg and Fentanyl  50 mcg was administered intravenously. Moderate Sedation Time: 48 minutes. The patient's level of consciousness and vital signs were  monitored continuously by radiology nursing throughout the procedure under my direct supervision. CONTRAST:  20 mL Isovue 300 - administered into the renal collecting system FLUOROSCOPY: Radiation Exposure Index and estimated peak skin dose (PSD); Reference air kerma (RAK), 14.4 mGy. COMPLICATIONS: None immediate. PROCEDURE: The procedure, risks, benefits, and alternatives were explained to the patient and/or patient's representative, questions were encouraged and answered and informed consent was obtained. A timeout was performed prior to the initiation of the procedure. The operative  site was prepped and draped in the usual sterile fashion and a sterile drape was applied covering the operative field. A sterile gown and sterile gloves were used for the procedure. Local anesthesia was provided with 1% Lidocaine  with epinephrine . The procedure began of the patient's LEFT side. Ultrasound was used to localize the LEFT kidney. Under direct ultrasound guidance, a 20 gauge needle was advanced into the renal collecting system. An ultrasound image documentation was performed. Access within the collecting system was confirmed with the efflux of urine followed by limited contrast injection. Under intermittent fluoroscopic guidance, an 0.018 wire was advanced into the collecting system and the tract was dilated with an Accustick stent. Next, over a short Amplatz wire, the track was further dilated ultimately allowing placement of a 10 Fr percutaneous nephrostomy catheter with end coiled and locked within the LEFT renal pelvis. The procedure was then repeated on the patient's RIGHT side, allowing placement of a 10 Fr percutaneous nephrostomy catheter with end coiled and locked within the RIGHT renal pelvis. Contrast was injected and several spot fluoroscopic images were obtained in various obliquities. The catheters were secured at the skin entrance site with an interrupted suture and a stat lock device and connected to a gravity bag. Dressings were applied. The patient tolerated procedure well without immediate postprocedural complication. FINDINGS: *Ultrasound scanning demonstrates a moderately dilated BILATERAL collecting systems. *Under a combination of ultrasound and fluoroscopic guidance, a posterior inferior calices were targeted allowing placement of a 10 Fr percutaneous nephrostomy catheter with end coiled and locked within the renal pelves. Contrast injection confirmed appropriate positioning. *Small volume extraluminal extravasation about the RIGHT renal pelvis, similar appearance to comparison CT  and suspicious for caliceal rupture. IMPRESSION: Successful ultrasound and fluoroscopic guided placement of a BILATERAL 10 Fr percutaneous nephrostomy drainage catheters. RECOMMENDATIONS: The patient will return to Vascular Interventional Radiology (VIR) for routine drainage catheter evaluation and exchange in 8 weeks. Thom Hall, MD [facility] Radiology Electronically Signed   By: Thom Hall M.D.   On: 06/13/2024 16:44   DG C-Arm 1-60 Min-No Report Result Date: 06/13/2024 Fluoroscopy was utilized by the requesting physician.  No radiographic interpretation.    Labs:  CBC: Recent Labs    06/01/24 1454 06/11/24 1233 06/12/24 0425 06/16/24 0426  WBC 3.0* 4.8 4.8 3.8*  HGB 9.8* 10.1* 8.8* 7.6*  HCT 29.9* 31.5* 28.1* 23.7*  PLT 114* 105* 92* 77*    COAGS: Recent Labs    02/21/24 0105 06/13/24 0905  INR 1.2 1.3*    BMP: Recent Labs    06/13/24 1710 06/14/24 0905 06/15/24 0431 06/16/24 0426  NA 143 146* 148* 142  K 4.1 3.4* 3.1* 3.3*  CL 108 113* 116* 110  CO2 15* 18* 21* 23  GLUCOSE 143* 121* 80 110*  BUN 71* 72* 48* 29*  CALCIUM 8.5* 8.5* 8.0* 7.7*  CREATININE 5.29* 3.87* 1.84* 1.07  GFRNONAA 10* 14* 34* >60    LIVER FUNCTION TESTS: Recent Labs    02/21/24 0328  04/06/24 1500 06/01/24 1454 06/11/24 1233  BILITOT 1.3* 0.4 0.5 0.9  AST 29 19 27  38  ALT 11 5 8 10   ALKPHOS 58 56 67 67  PROT 7.3 6.7 7.1 7.4  ALBUMIN 3.6 3.5 3.8 4.1    Assessment and Plan: S/p bilateral PCN placed 06/13/24 by Dr Hughes for obstructive hydroureteronephrosis Left PCN output: 400 mL in last 24 hours Right PCN output: 550 mL in last 24 hours Sutures in place Drains dressed appropriately Back pain improving  Creatinine normal Continue to monitor hemoglobin, continue flushing PCNs as ordered Further plans as per urology   IR will continue to follow. Please call with questions or concerns.   Please notify IR prior to  discharge to arrange proper follow up.   Electronically Signed: Ardythe Klute B Garald Rhew, NP 06/16/2024, 2:06 PM   I spent a total of 15 Minutes at the the patient's bedside AND on the patient's hospital floor or unit, greater than 50% of which was counseling/coordinating care for bilateral PCN.

## 2024-06-16 NOTE — Progress Notes (Signed)
 Physical Therapy Treatment Patient Details Name: Todd Holmes MRN: [id number] DOB: 1929-04-26 Today's Date: 06/16/2024   History of Present Illness MAXXON SCHWANKE is a 75 male admitted due to AKI. PMH: HTN, prostate cancer, anemia, arthritis, memory disorder, tremor, hernia repair    PT Comments  AxO x 2 improving cognition and following all commands.  Stated he was born in [place].  Lives with his Cousin and Niece. Was amb IND with in the home furniture walking. Pt was OOB in recliner.  Assisted with amb was difficult and limited.  General transfer comment: 50% cues for hand placement, assist to rise and steady exhibiting slight posterior lean and instability with turns. General Gait Details: Pt required + 2 assist for safety to amb a limited distance of 28 feet with walker.  Pt exhibits short, shuffled steps and increased instability esp with turns and back steps to recliner.  Max c/o fatigue with c/o B LE weakness.  HIGH FALL RISK. Recliner follow for safety.   LPT has rec Pt will need ST Rehab at SNF to address mobility and functional decline prior to safely returning home.    If plan is discharge home, recommend the following: A little help with walking and/or transfers;A little help with bathing/dressing/bathroom;Assist for transportation;Help with stairs or ramp for entrance;Assistance with cooking/housework   Can travel by private vehicle     Yes  Equipment Recommendations  Rolling walker (2 wheels)    Recommendations for Other Services       Precautions / Restrictions Precautions Precautions: Fall Precaution/Restrictions Comments: BILATERAL PERCUTANEOUS NEPHROSTOMY TUBES Restrictions Weight Bearing Restrictions Per Provider Order: No     Mobility  Bed Mobility               General bed mobility comments: OOB in recliner    Transfers Overall transfer level: Needs assistance Equipment used: Rolling walker (2 wheels) Transfers: Sit to/from Stand Sit to  Stand: Min assist, Mod assist, +2 physical assistance           General transfer comment: 50% cues for hand placement, assist to rise and steady exhibiting slight posterior lean and instability with turns.    Ambulation/Gait Ambulation/Gait assistance: Min assist, Mod assist, +2 physical assistance Gait Distance (Feet): 28 Feet Assistive device: Rolling walker (2 wheels) Gait Pattern/deviations: Step-to pattern, Decreased step length - right, Decreased step length - left, Narrow base of support, Shuffle Gait velocity: decreased     General Gait Details: Pt required + 2 assist for safety to amb a limited distance of 28 feet with walker.  Pt exhibits short, shuffled steps and increased instability esp with turns and back steps to recliner.  Max c/o fatigue with c/o B LE weakness.  HIGH FALL RISK.   Stairs             Wheelchair Mobility     Tilt Bed    Modified Rankin (Stroke Patients Only)       Balance                                            Communication    Cognition Arousal: Alert Behavior During Therapy: WFL for tasks assessed/performed   PT - Cognitive impairments: No family/caregiver present to determine baseline, Memory                       PT -  Cognition Comments: AxO x 2 improving cognition and following all commands.  Stated he was born in [place].  Lives with his Cousin and Niece. Following commands: Intact Following commands impaired: Only follows one step commands consistently    Cueing Cueing Techniques: Verbal cues  Exercises      General Comments        Pertinent Vitals/Pain Pain Assessment Pain Assessment: Faces Faces Pain Scale: Hurts little more Pain Location: B lateral posterior flank (near stents) Pain Descriptors / Indicators: Aching, Grimacing, Discomfort Pain Intervention(s): Monitored during session    Home Living                          Prior Function            PT Goals  (current goals can now be found in the care plan section) Progress towards PT goals: Progressing toward goals    Frequency    Min 2X/week      PT Plan      Co-evaluation              AM-PAC PT 6 Clicks Mobility   Outcome Measure  Help needed turning from your back to your side while in a flat bed without using bedrails?: A Little Help needed moving from lying on your back to sitting on the side of a flat bed without using bedrails?: A Lot Help needed moving to and from a bed to a chair (including a wheelchair)?: A Lot Help needed standing up from a chair using your arms (e.g., wheelchair or bedside chair)?: A Lot Help needed to walk in hospital room?: A Lot Help needed climbing 3-5 steps with a railing? : Total 6 Click Score: 12    End of Session Equipment Utilized During Treatment: Gait belt Activity Tolerance: Patient tolerated treatment well Patient left: in chair;with chair alarm set Nurse Communication: Mobility status PT Visit Diagnosis: Unsteadiness on feet (R26.81);Other abnormalities of gait and mobility (R26.89)     Time: 8956-8941 PT Time Calculation (min) (ACUTE ONLY): 15 min  Charges:    $Gait Training: 8-22 mins PT General Charges $$ ACUTE PT VISIT: 1 Visit                     Katheryn Leap  PTA Acute  Rehabilitation Services Office M-F          [phone]

## 2024-06-16 NOTE — Progress Notes (Signed)
 PROGRESS NOTE    Todd Holmes  FMW:[id number] DOB: [birth year] DOA: 06/11/2024 PCP: Elliot Charm, MD    Brief Narrative:  89 year old with history of hypertension, metastatic prostate cancer presents to the emergency room with left flank pain, long history of dysuria, poor appetite.  In the emergency room hemodynamically stable.  He was found to have elevated creatinine from baseline.  Received 250 mL bolus fluid and admitted to the hospital.  Subjective: Patient seen and examined.  Sitting comfortably on the recliner.  Pain is under control, denied any active issues.   Assessment & Plan:   Acute kidney injury: Baseline creatinine about 1.17.  Presented with a creatinine of 3.19-4.25-4.48-5.24 Potassium is normal.  Currently no indication for urgent hemodialysis. Continues to have nausea probably due to uremia. Adequate symptom control.  Low-dose maintenance IV fluids. Continue Foley catheter today for decompression. S/p Bilateral nephrostomy tube by IR don eon 9/30, urology also following. Continue close monitoring. Nephrology was following, signed off on 10/2 sCr 5.24>1.84>>1.07 creatinine improved, AKI resolved today    # Hypernatremia, mild.  Resolved Na 148>>142 resolved S/p D5w as per nephrology Check BMP daily   # Hypokalemia, potassium repleted. # Hypophosphatemia, Phos repleted. Check electrolytes daily and replete as needed.   # Essential hypertension blood pressures elevated.  Started on amlodipine .  Also using hydralazine.  Will add scheduled hydralazine along with amlodipine .  As needed blood pressure medications.  Avoid sudden drop.   # Metastatic prostate cancer: Symptomatic management.   # Conjunctivitis, eyes looks red and left eye crusting noticed on 10/2. Started Neosporin ointment  # Anemia and thrombocytopenia possible due to underlying malignancy Monitor H&H    DVT prophylaxis: heparin injection 5,000 Units Start: 06/14/24  0600   Code Status: DNR/DNI Family Communication: None at the bedside.  Surgery communicated with the family. Disposition Plan: Status is: Inpatient.  Inpatient procedures. 10/3 DC to SNF tomorrow a.m. on 10/4 As per [facility] insurance Auth approved from 10/2--10/6  Consultants:  Nephrology Urology Interventional radiology  Procedures:  S/p bilateral nephrostomy tube by IR done on 9/30.  Antimicrobials:  None    Objective: Vitals:   06/15/24 2145 06/16/24 0500 06/16/24 0506 06/16/24 1328  BP: 135/71  136/73 125/81  Pulse: 67  65 79  Resp: 15  16 18   Temp: 98.5 F (36.9 C)  98 F (36.7 C) 98.4 F (36.9 C)  TempSrc: Oral  Oral   SpO2: 98%  99% 97%  Weight:  59.2 kg    Height:        Intake/Output Summary (Last 24 hours) at 06/16/2024 1459 Last data filed at 06/16/2024 1354 Gross per 24 hour  Intake 2607.31 ml  Output 1420 ml  Net 1187.31 ml   Filed Weights   06/14/24 0500 06/15/24 0709 06/16/24 0500  Weight: 61.3 kg 61.5 kg 59.2 kg    Examination:  General exam: Appears calm and comfortable.  Slightly anxious today. Respiratory system: Clear to auscultation. Respiratory effort normal. Cardiovascular system: S1 & S2 heard, RRR. No JVD, murmurs, rubs, gallops or clicks. No pedal edema. Gastrointestinal system: Abdomen is nondistended, soft and nontender. No organomegaly or masses felt. Normal bowel sounds heard. Foley catheter with trace urine.  Looks clear. Central nervous system: Alert and oriented. No focal neurological deficits.  Generalized weakness. Extremities: Symmetric 5 x 5 power. Skin: No rashes, lesions or ulcers Psychiatry: Judgement and insight appear normal. Mood & affect appropriate.     Data Reviewed: I have personally reviewed following labs  and imaging studies  CBC: Recent Labs  Lab 06/11/24 1233 06/12/24 0425 06/16/24 0426  WBC 4.8 4.8 3.8*  NEUTROABS 3.9  --   --   HGB 10.1* 8.8* 7.6*  HCT 31.5* 28.1* 23.7*  MCV 93.8 93.4 92.2   PLT 105* 92* 77*   Basic Metabolic Panel: Recent Labs  Lab 06/13/24 0510 06/13/24 1710 06/14/24 0905 06/15/24 0431 06/16/24 0426  NA 142 143 146* 148* 142  K 4.2 4.1 3.4* 3.1* 3.3*  CL 106 108 113* 116* 110  CO2 16* 15* 18* 21* 23  GLUCOSE 146* 143* 121* 80 110*  BUN 77* 71* 72* 48* 29*  CREATININE 5.22* 5.29* 3.87* 1.84* 1.07  CALCIUM 8.5* 8.5* 8.5* 8.0* 7.7*  MG  --   --   --  2.3 2.0  PHOS  --   --   --  2.5 1.7*   GFR: Estimated Creatinine Clearance: 31.9 mL/min (by C-G formula based on SCr of 1.07 mg/dL). Liver Function Tests: Recent Labs  Lab 06/11/24 1233  AST 38  ALT 10  ALKPHOS 67  BILITOT 0.9  PROT 7.4  ALBUMIN 4.1   No results for input(s): LIPASE, AMYLASE in the last 168 hours. No results for input(s): AMMONIA in the last 168 hours. Coagulation Profile: Recent Labs  Lab 06/13/24 0905  INR 1.3*   Cardiac Enzymes: No results for input(s): CKTOTAL, CKMB, CKMBINDEX, TROPONINI in the last 168 hours. BNP (last 3 results) No results for input(s): PROBNP in the last 8760 hours. HbA1C: No results for input(s): HGBA1C in the last 72 hours. CBG: Recent Labs  Lab 06/12/24 2135 06/13/24 1406  GLUCAP 173* 127*   Lipid Profile: No results for input(s): CHOL, HDL, LDLCALC, TRIG, CHOLHDL, LDLDIRECT in the last 72 hours. Thyroid  Function Tests: No results for input(s): TSH, T4TOTAL, FREET4, T3FREE, THYROIDAB in the last 72 hours. Anemia Panel: No results for input(s): VITAMINB12, FOLATE, FERRITIN, TIBC, IRON, RETICCTPCT in the last 72 hours. Sepsis Labs: No results for input(s): PROCALCITON, LATICACIDVEN in the last 168 hours.  No results found for this or any previous visit (from the past 240 hours).    Radiology Studies: No results found.   Scheduled Meds:  amLODipine   5 mg Oral Daily   bacitracin-polymyxin b   Both Eyes BID   Chlorhexidine  Gluconate Cloth  6 each Topical Daily    escitalopram   10 mg Oral Daily   heparin injection (subcutaneous)  5,000 Units Subcutaneous Q8H   mirabegron  ER  25 mg Oral Daily   sodium chloride  flush  5 mL Intracatheter Q8H   Continuous Infusions:  potassium PHOSPHATE IVPB (in mmol) 30 mmol (06/16/24 1050)   promethazine (PHENERGAN) injection (IM or IVPB) 12.5 mg (06/12/24 1337)     LOS: 4 days    Time spent: 40 minutes    Elvan Sor, MD [facility]

## 2024-06-16 NOTE — Discharge Instructions (Signed)
 For nephrostomy tube care please keep record of tube output, change gauze dressing every 2-3 days and avoid getting insertion site wet; place water proof dressing over insertion site when showering ; call [phone] with any questions regarding nephrostomy tubes

## 2024-06-16 NOTE — Plan of Care (Signed)
   Problem: Education: Goal: Knowledge of General Education information will improve Description Including pain rating scale, medication(s)/side effects and non-pharmacologic comfort measures Outcome: Progressing   Problem: Health Behavior/Discharge Planning: Goal: Ability to manage health-related needs will improve Outcome: Progressing

## 2024-06-16 NOTE — Plan of Care (Signed)
 ?  Problem: Clinical Measurements: ?Goal: Will remain free from infection ?Outcome: Progressing ?  ?

## 2024-06-17 DIAGNOSIS — N179 Acute kidney failure, unspecified: Secondary | ICD-10-CM | POA: Diagnosis not present

## 2024-06-17 LAB — BASIC METABOLIC PANEL WITH GFR
Anion gap: 8 (ref 5–15)
BUN: 17 mg/dL (ref 8–23)
CO2: 24 mmol/L (ref 22–32)
Calcium: 7.6 mg/dL — ABNORMAL LOW (ref 8.9–10.3)
Chloride: 107 mmol/L (ref 98–111)
Creatinine, Ser: 0.89 mg/dL (ref 0.61–1.24)
GFR, Estimated: >60 mL/min (ref >60)
Glucose, Bld: 100 mg/dL — ABNORMAL HIGH (ref 70–99)
Potassium: 3.5 mmol/L (ref 3.5–5.1)
Sodium: 139 mmol/L (ref 135–145)

## 2024-06-17 LAB — CBC
HCT: 25.2 % — ABNORMAL LOW (ref 39.0–52.0)
Hemoglobin: 8.1 g/dL — ABNORMAL LOW (ref 13.0–17.0)
MCH: 29.5 pg (ref 26.0–34.0)
MCHC: 32.1 g/dL (ref 30.0–36.0)
MCV: 91.6 fL (ref 80.0–100.0)
Platelets: 92 K/uL — ABNORMAL LOW (ref 150–400)
RBC: 2.75 MIL/uL — ABNORMAL LOW (ref 4.22–5.81)
RDW: 14.5 % (ref 11.5–15.5)
WBC: 4 K/uL (ref 4.0–10.5)
nRBC: 0.5 % — ABNORMAL HIGH (ref 0.0–0.2)

## 2024-06-17 LAB — MAGNESIUM: Magnesium: 1.7 mg/dL (ref 1.7–2.4)

## 2024-06-17 LAB — PHOSPHORUS: Phosphorus: 2.2 mg/dL — ABNORMAL LOW (ref 2.5–4.6)

## 2024-06-17 MED ORDER — K PHOS MONO-SOD PHOS DI & MONO 155-852-130 MG PO TABS
500.0000 mg | ORAL_TABLET | Freq: Two times a day (BID) | ORAL | Status: AC
Start: 2024-06-17 — End: 2024-06-18
  Administered 2024-06-17 – 2024-06-18 (×3): 500 mg via ORAL
  Filled 2024-06-17 (×3): qty 2

## 2024-06-17 MED ORDER — K PHOS MONO-SOD PHOS DI & MONO 155-852-130 MG PO TABS: 500.0000 mg | ORAL_TABLET | Freq: Two times a day (BID) | ORAL | Status: DC

## 2024-06-17 NOTE — Plan of Care (Signed)
   Problem: Education: Goal: Knowledge of General Education information will improve Description Including pain rating scale, medication(s)/side effects and non-pharmacologic comfort measures Outcome: Progressing   Problem: Health Behavior/Discharge Planning: Goal: Ability to manage health-related needs will improve Outcome: Progressing

## 2024-06-17 NOTE — TOC Progression Note (Signed)
 Transition of Care [facility]) - Progression Note    Patient Details  Name: Todd Holmes MRN: [id number] Date of Birth: Jan 21, 1929  Transition of Care [facility]) CM/SW Contact  Heather DELENA Saltness, LCSW Phone Number: 06/17/2024, 10:42 AM  Clinical Narrative:    CSW spoke with [facility] and Rehab admissions staff who report pt can admit to SNF on Monday 10/6.   Expected Discharge Plan: Skilled Nursing Facility Barriers to Discharge: Continued Medical Work up    Expected Discharge Plan and Services  [facility]     Living arrangements for the past 2 months: Single Family Home                 Social Drivers of Health (SDOH) Interventions SDOH Screenings   Food Insecurity: No Food Insecurity (06/11/2024)  Housing: Low Risk  (06/11/2024)  Transportation Needs: No Transportation Needs (06/11/2024)  Utilities: Not At Risk (06/11/2024)  Depression (PHQ2-9): Low Risk  (04/06/2024)  Social Connections: Unknown (06/12/2024)  Tobacco Use: Low Risk  (06/12/2024)    Readmission Risk Interventions    06/14/2024    2:38 PM 02/24/2024    9:53 AM 02/21/2024    1:28 PM  Readmission Risk Prevention Plan  Post Dischage Appt   Complete  Medication Screening   Complete  Transportation Screening Complete Complete Complete  PCP or Specialist Appt within 5-7 Days  Complete   PCP or Specialist Appt within 3-5 Days Complete    Home Care Screening  Complete   Medication Review (RN CM)  Complete   HRI or Home Care Consult Complete    Social Work Consult for Recovery Care Planning/Counseling Complete    Palliative Care Screening Not Applicable    Medication Review Oceanographer) Complete      Signed: Heather Saltness, MSW, LCSW Clinical Social Worker Inpatient Care Management 06/17/2024 10:43 AM

## 2024-06-17 NOTE — Progress Notes (Signed)
 PROGRESS NOTE  Todd Holmes FMW:[id number] DOB: [birth year] DOA: 06/11/2024 PCP: Elliot Charm, MD   LOS: 5 days   Brief narrative:   89 year old male with past medical history of hypertension, metastatic prostate cancer presented to the emergency room with left flank pain,dysuria, poor appetite.  In the emergency room hemodynamically stable.  Patient was found to have elevated creatinine from baseline.  Received 250 mL bolus fluid and was then admitted to the hospital.    Assessment/Plan: Principal Problem:   AKI (acute kidney injury)  Acute kidney injury: Baseline creatinine about 1.17.  Presented with a creatinine of up to 5.2.  Received IV fluids.,  Initially was on Foley catheter for decompression.  Status post bilateral nephrostomy tube placement on 06/13/2024.  Urology following.  Creatinine has improved to 1.0 at this time.  Nephrology was on board and has signed off.  Patient is positive balance for 5 to 6/mL.  Has had 2625 mL output from urine.   Hypernatremia, mild.  Resolved Resolved.    Hypokalemia, improved after repletion.  Latest potassium of 3.5.  Check levels in AM   Hypophosphatemia, phosphorus was replenished.  Still slightly low at 2.2.  Continue to replenish     Essential hypertension.  On amlodipine  and hydralazine.  Blood pressure better at this time.    Metastatic prostate cancer: Continue symptomatic management.   Left conjunctivitis, on Neosporin ointment  Anemia and thrombocytopenia secondary to underlying malignancy.  Will continue to follow  Debility deconditioning and weakness.  PT OT has recommended skilled nursing facility placement at this time.  DVT prophylaxis: heparin injection 5,000 Units Start: 06/14/24 0600   Disposition: Skilled nursing facility.  Medically stable for disposition.  Status is: Inpatient Remains inpatient appropriate because: Need for rehabitation.    Code Status:     Code Status: Limited: Do not attempt  resuscitation (DNR) -DNR-LIMITED -Do Not Intubate/DNI   Family Communication: None at bedside  Consultants: Nephrology Urology Interventional radiology  Procedures: Bilateral nephrostomy tube placement by IR on 06/13/2024  Anti-infectives:  None current  Anti-infectives (From admission, onward)    Start     Dose/Rate Route Frequency Ordered Stop   06/13/24 1345  cefTRIAXone (ROCEPHIN) 1 g in sodium chloride  0.9 % 100 mL IVPB        1 g 200 mL/hr over 30 Minutes Intravenous  Once 06/13/24 0921 06/13/24 1450   06/11/24 1615  metroNIDAZOLE (FLAGYL) IVPB 500 mg  Status:  Discontinued        500 mg 100 mL/hr over 60 Minutes Intravenous  Once 06/11/24 1608 06/11/24 1715   06/11/24 1615  ceFEPIme (MAXIPIME) 2 g in sodium chloride  0.9 % 100 mL IVPB  Status:  Discontinued        2 g 200 mL/hr over 30 Minutes Intravenous  Once 06/11/24 1613 06/11/24 1715        Subjective: Today, patient was seen and examined at bedside.  Patient states that he feels okay.  Denies any nausea vomiting fever chills or rigor.  Objective: Vitals:   06/16/24 2017 06/17/24 0627  BP: 128/66 (!) 139/96  Pulse: 76 79  Resp: 18 18  Temp: 98 F (36.7 C) 98.2 F (36.8 C)  SpO2: 99% 97%    Intake/Output Summary (Last 24 hours) at 06/17/2024 1512 Last data filed at 06/17/2024 0911 Gross per 24 hour  Intake 1160 ml  Output 1525 ml  Net -365 ml   Filed Weights   06/14/24 0500 06/15/24 0709 06/16/24 0500  Weight:  61.3 kg 61.5 kg 59.2 kg   Body mass index is 24.66 kg/m.   Physical Exam: GENERAL: Patient is alert awake and oriented. Not in obvious distress.  Thinly built, elderly male, HENT: No scleral pallor or icterus. Pupils equally reactive to light. Oral mucosa is moist NECK: is supple, no gross swelling noted. CHEST: Clear to auscultation.   Diminished breath sounds bilaterally. CVS: S1 and S2 heard, no murmur. Regular rate and rhythm.  ABDOMEN: Soft, non-tender, bowel sounds are present.   Bilateral percutaneous nephrostomy tube in place. EXTREMITIES: No edema. CNS: Cranial nerves are intact. No focal motor deficits. SKIN: warm and dry without rashes.  Data Review: I have personally reviewed the following laboratory data and studies,  CBC: Recent Labs  Lab 06/11/24 1233 06/12/24 0425 06/16/24 0426 06/17/24 0522  WBC 4.8 4.8 3.8* 4.0  NEUTROABS 3.9  --   --   --   HGB 10.1* 8.8* 7.6* 8.1*  HCT 31.5* 28.1* 23.7* 25.2*  MCV 93.8 93.4 92.2 91.6  PLT 105* 92* 77* 92*   Basic Metabolic Panel: Recent Labs  Lab 06/13/24 1710 06/14/24 0905 06/15/24 0431 06/16/24 0426 06/17/24 0522  NA 143 146* 148* 142 139  K 4.1 3.4* 3.1* 3.3* 3.5  CL 108 113* 116* 110 107  CO2 15* 18* 21* 23 24  GLUCOSE 143* 121* 80 110* 100*  BUN 71* 72* 48* 29* 17  CREATININE 5.29* 3.87* 1.84* 1.07 0.89  CALCIUM 8.5* 8.5* 8.0* 7.7* 7.6*  MG  --   --  2.3 2.0 1.7  PHOS  --   --  2.5 1.7* 2.2*   Liver Function Tests: Recent Labs  Lab 06/11/24 1233  AST 38  ALT 10  ALKPHOS 67  BILITOT 0.9  PROT 7.4  ALBUMIN 4.1   No results for input(s): LIPASE, AMYLASE in the last 168 hours. No results for input(s): AMMONIA in the last 168 hours. Cardiac Enzymes: No results for input(s): CKTOTAL, CKMB, CKMBINDEX, TROPONINI in the last 168 hours. BNP (last 3 results) No results for input(s): BNP in the last 8760 hours.  ProBNP (last 3 results) No results for input(s): PROBNP in the last 8760 hours.  CBG: Recent Labs  Lab 06/12/24 2135 06/13/24 1406  GLUCAP 173* 127*   No results found for this or any previous visit (from the past 240 hours).   Studies: No results found.    Nassir Neidert, MD  [facility] 06/17/2024  If 7PM-7AM, please contact night-coverage

## 2024-06-17 NOTE — Progress Notes (Signed)
 Mobility Specialist - Progress Note   06/17/24 0945  Mobility  Activity Ambulated with assistance  Level of Assistance Minimal assist, patient does 75% or more  Assistive Device Front wheel walker  Distance Ambulated (ft) 180 ft  Range of Motion/Exercises Active  Activity Response Tolerated well  Mobility Referral Yes  Mobility visit 1 Mobility  Mobility Specialist Start Time (ACUTE ONLY) 0930  Mobility Specialist Stop Time (ACUTE ONLY) 0945  Mobility Specialist Time Calculation (min) (ACUTE ONLY) 15 min   Pt was found in bed and agreeable to mobilize. Min-A for bed mobility and CG for ambulation. No complaints. Returned to recliner chair with all needs met. Call bell in reach and chair alarm on. RN notified.   Erminio Leos,  Mobility Specialist Can be reached via Secure Chat

## 2024-06-17 NOTE — Hospital Course (Signed)
  89 year old male with past medical history of hypertension, metastatic prostate cancer presented to the emergency room with left flank pain,dysuria, poor appetite.  In the emergency room hemodynamically stable.  Patient was found to have elevated creatinine from baseline.  Received 250 mL bolus fluid and was then admitted to the hospital.   Assessment & Plan:   Acute kidney injury: Baseline creatinine about 1.17.  Presented with a creatinine of up to 5.2.  Received IV fluids.,  Foley catheter for decompression.  Status post bilateral nephrostomy tube placement on 06/13/2024.  Urology following.  Creatinine has improved to 1.0 at this time.  Nephrology was on board and has signed off.   Hypernatremia, mild.  Resolved Resolved.    Hypokalemia, improved after repletion.  Latest potassium of 3.5   Hypophosphatemia, phosphorus was replenished.  Still slightly low at 2.2.  Continue to replenish    Essential hypertension.  On amlodipine  and hydralazine.  Blood pressure better at this time.    Metastatic prostate cancer: Continue symptomatic management.   Left conjunctivitis, on Neosporin ointment  Anemia and thrombocytopenia secondary to underlying malignancy.  Will continue to follow  Debility deconditioning and weakness.  PT OT has recommended skilled nursing facility placement at this time.

## 2024-06-18 DIAGNOSIS — N179 Acute kidney failure, unspecified: Secondary | ICD-10-CM | POA: Diagnosis not present

## 2024-06-18 LAB — CBC
HCT: 24.1 % — ABNORMAL LOW (ref 39.0–52.0)
Hemoglobin: 7.5 g/dL — ABNORMAL LOW (ref 13.0–17.0)
MCH: 29.1 pg (ref 26.0–34.0)
MCHC: 31.1 g/dL (ref 30.0–36.0)
MCV: 93.4 fL (ref 80.0–100.0)
Platelets: 90 K/uL — ABNORMAL LOW (ref 150–400)
RBC: 2.58 MIL/uL — ABNORMAL LOW (ref 4.22–5.81)
RDW: 14.6 % (ref 11.5–15.5)
WBC: 3.5 K/uL — ABNORMAL LOW (ref 4.0–10.5)
nRBC: 0.6 % — ABNORMAL HIGH (ref 0.0–0.2)

## 2024-06-18 LAB — PHOSPHORUS: Phosphorus: 3 mg/dL (ref 2.5–4.6)

## 2024-06-18 LAB — BASIC METABOLIC PANEL WITH GFR
Anion gap: 9 (ref 5–15)
BUN: 16 mg/dL (ref 8–23)
CO2: 25 mmol/L (ref 22–32)
Calcium: 7.6 mg/dL — ABNORMAL LOW (ref 8.9–10.3)
Chloride: 108 mmol/L (ref 98–111)
Creatinine, Ser: 0.83 mg/dL (ref 0.61–1.24)
GFR, Estimated: >60 mL/min (ref >60)
Glucose, Bld: 98 mg/dL (ref 70–99)
Potassium: 3.5 mmol/L (ref 3.5–5.1)
Sodium: 142 mmol/L (ref 135–145)

## 2024-06-18 LAB — MAGNESIUM: Magnesium: 1.8 mg/dL (ref 1.7–2.4)

## 2024-06-18 NOTE — Plan of Care (Signed)
   Problem: Clinical Measurements: Goal: Will remain free from infection Outcome: Progressing Goal: Diagnostic test results will improve Outcome: Progressing

## 2024-06-18 NOTE — Progress Notes (Addendum)
 PROGRESS NOTE  Todd Holmes FMW:[id number] DOB: 05-10-1929 DOA: 06/11/2024 PCP: Elliot Charm, MD   LOS: 6 days   Brief narrative:  89 year old male with past medical history of hypertension, metastatic prostate cancer presented to the emergency room with left flank pain,dysuria, poor appetite.  In the emergency room hemodynamically stable.  Patient was found to have elevated creatinine from baseline.  Received 250 mL bolus fluid and was then admitted to the hospital.    Assessment/Plan: Principal Problem:   AKI (acute kidney injury)  Acute kidney injury: Baseline creatinine about 1.17.  Presented with a creatinine of up to 5.2.  Received IV fluids.,  Initially was on Foley catheter for decompression.  Status post bilateral nephrostomy tube placement on 06/13/2024.  Urology following.  Creatinine has improved to 10.8 at this time.  Nephrology was on board and has signed off.  Patient is positive balance for .  Has had 1350 mL output from urine.  Will need to notify IR for PCN tubes for follow-up.   Hypernatremia, mild.  Resolved Resolved.    Hypokalemia, improved after repletion.  Latest potassium of 3.5.    Hypophosphatemia, phosphorus was replenished.  Last level at 3.0    Essential hypertension.  On amlodipine  and hydralazine.  Blood pressure better at this time.    Metastatic prostate cancer: Continue symptomatic management.   Left conjunctivitis, on Neosporin ointment  Anemia and thrombocytopenia secondary to underlying malignancy.  Will continue to follow. Latest hemoglobin of 7.5 and platelets of 90.  Debility, deconditioning and weakness.  PT OT has recommended skilled nursing facility placement at this time.  DVT prophylaxis: heparin injection 5,000 Units Start: 06/14/24 0600   Disposition: Skilled nursing facility.  Medically stable for disposition.  Status is: Inpatient Remains inpatient appropriate because: Need for rehabitation.    Code Status:      Code Status: Limited: Do not attempt resuscitation (DNR) -DNR-LIMITED -Do Not Intubate/DNI   Family Communication: spoke with cousin and updated her about the clinical condition of the patient.  Consultants: Nephrology Urology Interventional radiology  Procedures: Bilateral nephrostomy tube placement by IR on 06/13/2024  Anti-infectives:  None current  Anti-infectives (From admission, onward)    Start     Dose/Rate Route Frequency Ordered Stop   06/13/24 1345  cefTRIAXone (ROCEPHIN) 1 g in sodium chloride  0.9 % 100 mL IVPB        1 g 200 mL/hr over 30 Minutes Intravenous  Once 06/13/24 0921 06/13/24 1450   06/11/24 1615  metroNIDAZOLE (FLAGYL) IVPB 500 mg  Status:  Discontinued        500 mg 100 mL/hr over 60 Minutes Intravenous  Once 06/11/24 1608 06/11/24 1715   06/11/24 1615  ceFEPIme (MAXIPIME) 2 g in sodium chloride  0.9 % 100 mL IVPB  Status:  Discontinued        2 g 200 mL/hr over 30 Minutes Intravenous  Once 06/11/24 1613 06/11/24 1715       Subjective: Today, patient was seen and examined at bedside.  Patient denies pain, nausea, vomiting, fever, chills or rigor.  Feels okay.   Objective: Vitals:   06/17/24 2100 06/18/24 0523  BP: 132/61 133/66  Pulse: 68 70  Resp: 18 16  Temp: 98.3 F (36.8 C) 98.1 F (36.7 C)  SpO2: 99% 98%    Intake/Output Summary (Last 24 hours) at 06/18/2024 1051 Last data filed at 06/18/2024 1000 Gross per 24 hour  Intake 1290 ml  Output 1350 ml  Net -60 ml  Filed Weights   06/15/24 0709 06/16/24 0500 06/18/24 0500  Weight: 61.5 kg 59.2 kg 61.1 kg   Body mass index is 25.45 kg/m.   Physical Exam:  GENERAL: Patient is alert awake and oriented. Not in obvious distress.  Thinly built, elderly male, Communicative HENT: No scleral pallor or icterus. Pupils equally reactive to light. Oral mucosa is moist NECK: is supple, no gross swelling noted. CHEST: Clear to auscultation.   Diminished breath sounds bilaterally. CVS: S1  and S2 heard, no murmur. Regular rate and rhythm.  ABDOMEN: Soft, non-tender, bowel sounds are present.  Bilateral percutaneous nephrostomy tube in place with urine output.SABRA EXTREMITIES: No edema. CNS: Cranial nerves are intact. No focal motor deficits. SKIN: warm and dry without rashes.  Data Review: I have personally reviewed the following laboratory data and studies,  CBC: Recent Labs  Lab 06/11/24 1233 06/12/24 0425 06/16/24 0426 06/17/24 0522 06/18/24 0547  WBC 4.8 4.8 3.8* 4.0 3.5*  NEUTROABS 3.9  --   --   --   --   HGB 10.1* 8.8* 7.6* 8.1* 7.5*  HCT 31.5* 28.1* 23.7* 25.2* 24.1*  MCV 93.8 93.4 92.2 91.6 93.4  PLT 105* 92* 77* 92* 90*   Basic Metabolic Panel: Recent Labs  Lab 06/14/24 0905 06/15/24 0431 06/16/24 0426 06/17/24 0522 06/18/24 0547  NA 146* 148* 142 139 142  K 3.4* 3.1* 3.3* 3.5 3.5  CL 113* 116* 110 107 108  CO2 18* 21* 23 24 25   GLUCOSE 121* 80 110* 100* 98  BUN 72* 48* 29* 17 16  CREATININE 3.87* 1.84* 1.07 0.89 0.83  CALCIUM 8.5* 8.0* 7.7* 7.6* 7.6*  MG  --  2.3 2.0 1.7 1.8  PHOS  --  2.5 1.7* 2.2* 3.0   Liver Function Tests: Recent Labs  Lab 06/11/24 1233  AST 38  ALT 10  ALKPHOS 67  BILITOT 0.9  PROT 7.4  ALBUMIN 4.1   No results for input(s): LIPASE, AMYLASE in the last 168 hours. No results for input(s): AMMONIA in the last 168 hours. Cardiac Enzymes: No results for input(s): CKTOTAL, CKMB, CKMBINDEX, TROPONINI in the last 168 hours. BNP (last 3 results) No results for input(s): BNP in the last 8760 hours.  ProBNP (last 3 results) No results for input(s): PROBNP in the last 8760 hours.  CBG: Recent Labs  Lab 06/12/24 2135 06/13/24 1406  GLUCAP 173* 127*   No results found for this or any previous visit (from the past 240 hours).   Studies: No results found.    Inetha Maret, MD  [facility] 06/18/2024  If 7PM-7AM, please contact night-coverage

## 2024-06-18 NOTE — Plan of Care (Signed)

## 2024-06-19 DIAGNOSIS — N179 Acute kidney failure, unspecified: Secondary | ICD-10-CM | POA: Diagnosis not present

## 2024-06-19 LAB — CBC
HCT: 22.5 % — ABNORMAL LOW (ref 39.0–52.0)
Hemoglobin: 7.3 g/dL — ABNORMAL LOW (ref 13.0–17.0)
MCH: 29.7 pg (ref 26.0–34.0)
MCHC: 32.4 g/dL (ref 30.0–36.0)
MCV: 91.5 fL (ref 80.0–100.0)
Platelets: 100 K/uL — ABNORMAL LOW (ref 150–400)
RBC: 2.46 MIL/uL — ABNORMAL LOW (ref 4.22–5.81)
RDW: 14.6 % (ref 11.5–15.5)
WBC: 3.9 K/uL — ABNORMAL LOW (ref 4.0–10.5)
nRBC: 0 % (ref 0.0–0.2)

## 2024-06-19 LAB — BASIC METABOLIC PANEL WITH GFR
Anion gap: 8 (ref 5–15)
BUN: 15 mg/dL (ref 8–23)
CO2: 26 mmol/L (ref 22–32)
Calcium: 7.8 mg/dL — ABNORMAL LOW (ref 8.9–10.3)
Chloride: 107 mmol/L (ref 98–111)
Creatinine, Ser: 1.06 mg/dL (ref 0.61–1.24)
GFR, Estimated: >60 mL/min (ref >60)
Glucose, Bld: 89 mg/dL (ref 70–99)
Potassium: 3.6 mmol/L (ref 3.5–5.1)
Sodium: 141 mmol/L (ref 135–145)

## 2024-06-19 MED ORDER — BACITRACIN-POLYMYXIN B 500-10000 UNIT/GM OP OINT: TOPICAL_OINTMENT | Freq: Two times a day (BID) | OPHTHALMIC | 0 refills | Status: DC

## 2024-06-19 MED ORDER — TRAMADOL HCL 50 MG PO TABS: 50.0000 mg | ORAL_TABLET | Freq: Four times a day (QID) | ORAL | 0 refills | Status: DC | PRN

## 2024-06-19 MED ORDER — AMLODIPINE BESYLATE 5 MG PO TABS: 5.0000 mg | ORAL_TABLET | Freq: Every day | ORAL | Status: DC

## 2024-06-19 NOTE — Plan of Care (Signed)

## 2024-06-19 NOTE — TOC Transition Note (Signed)
 Transition of Care [facility]) - Discharge Note   Patient Details  Name: Todd Holmes MRN: [id number] Date of Birth: 1929/02/28  [facility] CM/SW Contact:  Alfonse JONELLE Rex, RN Phone Number: 06/19/2024, 12:20 PM   Clinical Narrative:   DC to [facility] for short term rehab, RM 1003. [facility] for transport. NCM called to pt's cousin, Bernice, to notify. Erika notified NCM last week she would be leaving the county on 06/19/24 returning on 06/25/24.  No further TOC needs identified at this time.     Final next level of care: Skilled Nursing Facility Barriers to Discharge: Barriers Resolved   Patient Goals and CMS Choice Patient states their goals for this hospitalization and ongoing recovery are:: return home CMS Medicare.gov Compare Post Acute Care list provided to:: Patient Represenative (must comment) pecolia bernice (Cousin)  [phone] (Mobile)) Choice offered to / list presented to : Patient [place] ownership interest in [facility].provided to:: Patient    Discharge Placement              Patient chooses bed at: [facility] Patient to be transferred to facility by: [facility] Name of family member notified: Bernice Beat (cousin), left vm, informed NCM she would be leaving the country on 06/19/24 Patient and family notified of of transfer: 06/19/24  Discharge Plan and Services Additional resources added to the After Visit Summary for                                       Social Drivers of Health (SDOH) Interventions SDOH Screenings   Food Insecurity: No Food Insecurity (06/11/2024)  Housing: Low Risk  (06/11/2024)  Transportation Needs: No Transportation Needs (06/11/2024)  Utilities: Not At Risk (06/11/2024)  Depression (PHQ2-9): Low Risk  (04/06/2024)  Social Connections: Unknown (06/12/2024)  Tobacco Use: Low Risk  (06/12/2024)     Readmission Risk Interventions    06/19/2024   12:19 PM 06/14/2024    2:38 PM 02/24/2024    9:53 AM   Readmission Risk Prevention Plan  Transportation Screening Complete Complete Complete  PCP or Specialist Appt within 5-7 Days   Complete  PCP or Specialist Appt within 3-5 Days Complete Complete   Home Care Screening   Complete  Medication Review (RN CM)   Complete  HRI or Home Care Consult Complete Complete   Social Work Consult for Recovery Care Planning/Counseling Complete Complete   Palliative Care Screening Not Applicable Not Applicable   Medication Review Ocean[employer]) Complete Complete

## 2024-06-19 NOTE — Progress Notes (Signed)
 Attempted to call report to [facility]. Transferred to phone with no answer.

## 2024-06-19 NOTE — Progress Notes (Signed)
 Mobility Specialist - Progress Note   06/19/24 1000  Mobility  Activity Ambulated with assistance  Level of Assistance Standby assist, set-up cues, supervision of patient - no hands on  Assistive Device Front wheel walker  Distance Ambulated (ft) 160 ft  Range of Motion/Exercises Active  Activity Response Tolerated well  Mobility Referral Yes  Mobility visit 1 Mobility  Mobility Specialist Start Time (ACUTE ONLY) 0955  Mobility Specialist Stop Time (ACUTE ONLY) 1010  Mobility Specialist Time Calculation (min) (ACUTE ONLY) 15 min   Received in bed and agreed to mobility. Had no issues throughout session, returned to chair with all needs met, alarm on.  Cyndee Ada Mobility Specialist

## 2024-06-19 NOTE — Discharge Summary (Signed)
 Physician Discharge Summary  Todd Holmes FMW:[id number] DOB: [birth year] DOA: 06/11/2024  PCP: Elliot Charm, MD  Admit date: 06/11/2024 Discharge date: 06/19/2024  Admitted From: Home  Discharge disposition: SNF   Recommendations for Outpatient Follow-Up:   Follow up with your primary care provider at the skilled nursing facility in 3 to 5 days Check CBC, BMP, magnesium in the next visit Patient will need to follow-up with interventional radiology as outpatient for management of PCN tube.  Discharge Diagnosis:   Principal Problem:   AKI (acute kidney injury)  Discharge Condition: Improved.  Diet recommendation: Regular.  Wound care: None.  Code status: DNR   History of Present Illness:   89 year old male with past medical history of hypertension, metastatic prostate cancer presented to the emergency room with left flank pain,dysuria, poor appetite. In the emergency room hemodynamically stable. Patient was found to have elevated creatinine from baseline. Received 250 mL bolus fluid and was then admitted to the hospital.   Hospital Course:   Following conditions were addressed during hospitalization as listed below,  Acute kidney injury: Baseline creatinine about 1.17.  Presented with a creatinine of up to 5.2.  Received IV fluids.,  Initially was on Foley catheter for decompression.  Status post bilateral nephrostomy tube placement on 06/13/2024.  Urology followed the patient during hospitalization.  Patient will need to follow-up with IR as outpatient.  Nephrology also saw the patient and has signed off.  IR has been notified prior to discharge.    Hypernatremia, mild.  Resolved   Hypokalemia, improved after repletion.  Latest potassium of 3.6    Hypophosphatemia, phosphorus was replenished.  Last level at 3.0     Essential hypertension.  On amlodipine  and hydralazine.  Blood pressure better at this time.    Metastatic prostate cancer: Continue  symptomatic management.   Left conjunctivitis, on Neosporin ointment continue on discharge.   Anemia and thrombocytopenia secondary to underlying malignancy.  Will continue to follow. Latest hemoglobin of 7.5 and platelets of 100.  Thrombocytopenia improving.   Debility, deconditioning and weakness.  PT OT has recommended skilled nursing facility placement at this time.  Disposition.  At this time, patient is stable for disposition to skilled nursing facility.  Medical Consultants:   Nephrology Urology Interventional radiology  Procedures:    Bilateral percutaneous nephrostomy tube placement 06/13/2024 Subjective:   Today, patient was seen and examined at bedside.  Denies any nausea vomiting fever chills or rigor.  Discharge Exam:   Vitals:   06/19/24 0559 06/19/24 1225  BP: 128/70 133/68  Pulse: 73 76  Resp: 15 17  Temp: (!) 97.5 F (36.4 C) 97.9 F (36.6 C)  SpO2: 94% 97%   Vitals:   06/18/24 2100 06/19/24 0500 06/19/24 0559 06/19/24 1225  BP: 128/67  128/70 133/68  Pulse: 77  73 76  Resp: 16  15 17   Temp: 98.6 F (37 C)  (!) 97.5 F (36.4 C) 97.9 F (36.6 C)  TempSrc: Oral  Oral Oral  SpO2: 96%  94% 97%  Weight:  61.5 kg    Height:       Body mass index is 25.62 kg/m.  General: Alert awake, not in obvious distress, elderly male HENT: pupils equally reacting to light,  No scleral pallor or icterus noted. Oral mucosa is moist.  Chest:  Clear breath sounds.  Diminished breath sounds bilaterally. No crackles or wheezes.  CVS: S1 &S2 heard. No murmur.  Regular rate and rhythm. Abdomen: Soft, nontender, nondistended.  Bowel  sounds are heard.  Bilateral percutaneous nephrostomy tube in place. Extremities: No cyanosis, clubbing or edema.  Peripheral pulses are palpable. Psych: Alert, awake and oriented, normal mood CNS:  No cranial nerve deficits.  Power equal in all extremities.   Skin: Warm and dry.  No rashes noted.  The results of significant diagnostics  from this hospitalization (including imaging, microbiology, ancillary and laboratory) are listed below for reference.     Diagnostic Studies:   CT L-SPINE NO CHARGE Addendum Date: 06/17/2024 ADDENDUM REPORT: 06/17/2024 14:00 ADDENDUM: Per CT technologist Summer Tanbouz the CT images placed on patient's record is for different patient and was done so in error. Physicians were notified of results at images placed in the correct patient's record. PA Leita Chancy was notified to disregard there is report for Greco Gastelum due to incorrect images paste on the patient's record by CT technologist and I 05/03/2024 at 5:43 p.m. Electronically Signed   By: Leita Birmingham M.D.   On: 06/17/2024 14:00   Result Date: 06/17/2024 CLINICAL DATA:  Left flank pain. Stage IV metastatic prostate cancer. EXAM: CT LUMBAR SPINE WITHOUT CONTRAST TECHNIQUE: Multidetector CT imaging of the lumbar spine was performed without intravenous contrast administration. Multiplanar CT image reconstructions were also generated. RADIATION DOSE REDUCTION: This exam was performed according to the departmental dose-optimization program which includes automated exposure control, adjustment of the mA and/or kV according to patient size and/or use of iterative reconstruction technique. COMPARISON:  None Available. FINDINGS: Segmentation: 5 lumbar type vertebrae. Alignment: Normal. Vertebrae: No acute fracture is seen. Osteopenia is noted. No focal lytic or destructive lesion is seen. Paraspinal and other soft tissues: No acute abnormality. Please see CT abdomen and pelvis for additional information. Disc levels: There is multilevel intervertebral disc space narrowing, degenerative endplate changes, disc herniations and facet arthropathy resulting in mild-to-moderate spinal canal and neural foraminal stenosis. There is severe spinal canal stenosis at L3-L4. IMPRESSION: 1. No acute fracture or lytic or destructive lesion. 2. Multilevel degenerative  changes in the lumbar spine as described above. Electronically Signed: By: Leita Birmingham M.D. On: 06/11/2024 16:05   CT L-SPINE NO CHARGE Result Date: 06/11/2024 CLINICAL DATA:  left flank pain starting yesterday. Sent here by UC after UA dip. Has stage four metastatic prostate cancer and has been losing weight. Denies any current urinary symptoms. EXAM: CT LUMBAR SPINE WITHOUT CONTRAST TECHNIQUE: Multidetector CT imaging of the lumbar spine was performed without intravenous contrast administration. Multiplanar CT image reconstructions were also generated. RADIATION DOSE REDUCTION: This exam was performed according to the departmental dose-optimization program which includes automated exposure control, adjustment of the mA and/or kV according to patient size and/or use of iterative reconstruction technique. COMPARISON:  CT abdomen pelvis 02/20/2024 FINDINGS: Segmentation: 5 lumbar type vertebrae. Alignment: Similar-appearing levoscoliosis of the lumbar spine centered at the L3-L4 level. Vertebrae: Diffusely decreased bone density. Similar-appearing scattered blastic osseous metastasis of the lumbar spine. Redemonstration of lytic and blastic osseous lesions of the sacrum. No acute fracture or focal pathologic process. Paraspinal and other soft tissues: Negative. Disc levels: Multilevel intervertebral disc space narrowing and vacuum phenomenon. IMPRESSION: 1. Similar-appearing scattered blastic osseous metastasis of the lumbar spine. Redemonstration of lytic and blastic osseous lesions of the sacrum. 2. No acute displaced fracture or traumatic listhesis of the lumbar spine. 3. Diffusely decreased bone density. Electronically Signed   By: Morgane  Naveau M.D.   On: 06/11/2024 19:16   CT ABDOMEN PELVIS WO CONTRAST Result Date: 06/11/2024 CLINICAL DATA:  Abdominal pain, acute,  nonlocalized left flank pain EXAM: CT ABDOMEN AND PELVIS WITHOUT CONTRAST TECHNIQUE: Multidetector CT imaging of the abdomen and pelvis was  performed following the standard protocol without IV contrast. RADIATION DOSE REDUCTION: This exam was performed according to the departmental dose-optimization program which includes automated exposure control, adjustment of the mA and/or kV according to patient size and/or use of iterative reconstruction technique. COMPARISON:  CT abdomen pelvis 02/20/2024 FINDINGS: Lower chest: Trace left pleural effusion.  Tiny hiatal hernia. Hepatobiliary: No focal liver abnormality. No gallstones, gallbladder wall thickening, or pericholecystic fluid. No biliary dilatation. Pancreas: Diffuse coarse calcifications. Diffusely atrophic. No focal lesion. Otherwise normal pancreatic contour. No surrounding inflammatory changes. No main pancreatic ductal dilatation. ductal dilatation. Spleen: Normal in size without focal abnormality. Adrenals/Urinary Tract: No adrenal nodule bilaterally. Interval development of bilateral mild to moderate hydroureteronephrosis. No nephrolithiasis. No ureterolithiasis. The urinary bladder is unremarkable. Stomach/Bowel: Stomach is within normal limits. No evidence of bowel wall thickening or dilatation. Diffuse colonic diverticulosis. Appendix appears normal. Vascular/Lymphatic: Phleboliths noted along the pelvis. No abdominal aorta or iliac aneurysm. Moderate atherosclerotic plaque of the aorta and its branches. Difficult to measure on this noncontrast study persistent retroperitoneal and pelvic lymphadenopathy. Reproductive: Prostate is unremarkable with radiation seeds noted. Other: Interval development of diffuse mild mesenteric edema. Interval development of trace volume intraperitoneal free fluid. No intraperitoneal free gas. No organized fluid collection. Musculoskeletal: Status post left inguinal hernia repair with mesh. No recurrent hernia. No abdominal wall hernia or abnormality. Lytic and blastic metastatic lesions of the sacrum again noted. Please see separately dictated CT lumbar spine  06/11/2024. No acute displaced fracture. IMPRESSION: 1. Interval development of bilateral mild to moderate hydroureteronephrosis. No nephroureterolithiasis bilaterally. This may reflect the changes of obstructive uropathy or reflux. 2. Trace left pleural effusion. 3. Interval development of mild mesenteric edema and trace volume simple free fluid ascites. 4. Tiny hiatal hernia. 5. Chronic pancreatitis with no findings of acute pancreatitis. 6. Colonic diverticulosis with no acute diverticulitis. 7. Difficult to measure on this noncontrast study persistent retroperitoneal and pelvic lymphadenopathy. Persistent lytic and blastic metastatic lesions in a patient with known prostate cancer. Electronically Signed   By: Morgane  Naveau M.D.   On: 06/11/2024 19:10   CT ABDOMEN PELVIS WO CONTRAST Addendum Date: 06/11/2024 ADDENDUM REPORT: 06/11/2024 17:49 ADDENDUM: PA Leita Chancy was notified to disregard this report for Arianna Delsanto due to incorrect images placed on patient's record by CT technologist. Critical Value/emergent results were called by telephone on 06/11/2024 at 5:43 pm to provider LEITA MATTER , who verbally acknowledged these results and is taking care of patient with images provided. New report will be generated within images are moved to the correct patient. Electronically Signed   By: Leita Birmingham M.D.   On: 06/11/2024 17:49   Addendum Date: 06/11/2024 ADDENDUM REPORT: 06/11/2024 17:31 ADDENDUM: Per CT technologist Summer Tanbouz the CT images placed on patient's record is a for a different patient. Physicians will be notified of results and images placed in the correct patient's record. Electronically Signed   By: Leita Birmingham M.D.   On: 06/11/2024 17:31   Addendum Date: 06/11/2024 ADDENDUM REPORT: 06/11/2024 16:01 ADDENDUM: Critical Value/emergent results were called by telephone at the time of interpretation on 06/11/2024 at 4:01 pm to provider LEITA CHANCY , who verbally acknowledged these  results. Electronically Signed   By: Leita Birmingham M.D.   On: 06/11/2024 16:01   Result Date: 06/11/2024 CLINICAL DATA:  Left lower quadrant abdominal pain. Left flank pain starting  yesterday. History of stage IV metastatic prostate cancer. EXAM: CT ABDOMEN AND PELVIS WITHOUT CONTRAST TECHNIQUE: Multidetector CT imaging of the abdomen and pelvis was performed following the standard protocol without IV contrast. RADIATION DOSE REDUCTION: This exam was performed according to the departmental dose-optimization program which includes automated exposure control, adjustment of the mA and/or kV according to patient size and/or use of iterative reconstruction technique. COMPARISON:  02/20/2024. FINDINGS: Lower chest: Multi-vessel coronary artery calcifications are noted. Mild atelectasis is present at the lung bases. Hepatobiliary: No focal liver abnormality is seen. No biliary ductal dilatation. Stones are seen within the gallbladder. Pancreas: Unremarkable. No pancreatic ductal dilatation or surrounding inflammatory changes. Spleen: Normal in size without focal abnormality. Adrenals/Urinary Tract: There is thickening of the adrenal glands bilaterally without evidence of discrete nodule., possible hyperplasia. A nonobstructive renal calculus is noted on the left. Arterial calcifications are also seen on the left. No hydronephrosis bilaterally. There is mild thickening of the anterior bladder wall with associated inflammatory changes. Stomach/Bowel: The stomach is within normal limits. No bowel obstruction. There is pneumatosis involving a segment of small bowel the mid left abdomen, axial image 46 with associated foci of free air is in the left abdomen. There is a ventral abdominal wall hernia containing nonobstructed small bowel. The appendix is not well delineated. Vascular/Lymphatic: Aortic atherosclerosis. No enlarged abdominal or pelvic lymph nodes. Reproductive: Prostate gland is mildly enlarged. Other: No  abdominopelvic ascites. Musculoskeletal: Degenerative changes are present in the thoracolumbar spine. Please see CT lumbar spine for additional information. IMPRESSION: 1. Segment of small bowel in the the left abdomen with pneumatosis and associated foci of free air, uncertain ideology and perforation cannot be excluded. Surgical consultation is recommended. 2. Bladder wall thickening with associated inflammatory changes, possible infectious or inflammatory cystitis. 3. Low anterior abdominal wall hernia containing nonobstructed small bowel. 4. Nonobstructive left renal calculus. 5. Aortic atherosclerosis. Electronically Signed: By: Leita Birmingham M.D. On: 06/11/2024 15:57   DG Chest Port 1 View Result Date: 06/11/2024 CLINICAL DATA:  Questionable sepsis-evaluate for abnormality. EXAM: PORTABLE CHEST 1 VIEW COMPARISON:  Radiographs 02/21/2024 and 09/11/2011. Abdominal CT 06/11/2024. FINDINGS: 1625 hours. Low lung volumes with mildly increased atelectasis at both lung bases. The heart size and mediastinal contours are stable with aortic and brachycephalic tortuosity. There is no confluent airspace disease, pneumothorax or significant pleural effusion. Thoracolumbar scoliosis without evidence of acute osseous abnormality. IMPRESSION: Low lung volumes with mildly increased bibasilar atelectasis. No evidence of pneumonia. Electronically Signed   By: Elsie Perone M.D.   On: 06/11/2024 16:57     Labs:   Basic Metabolic Panel: Recent Labs  Lab 06/15/24 0431 06/16/24 0426 06/17/24 0522 06/18/24 0547 06/19/24 0438  NA 148* 142 139 142 141  K 3.1* 3.3* 3.5 3.5 3.6  CL 116* 110 107 108 107  CO2 21* 23 24 25 26   GLUCOSE 80 110* 100* 98 89  BUN 48* 29* 17 16 15   CREATININE 1.84* 1.07 0.89 0.83 1.06  CALCIUM 8.0* 7.7* 7.6* 7.6* 7.8*  MG 2.3 2.0 1.7 1.8  --   PHOS 2.5 1.7* 2.2* 3.0  --    GFR Estimated Creatinine Clearance: 32.2 mL/min (by C-G formula based on SCr of 1.06 mg/dL). Liver Function  Tests: No results for input(s): AST, ALT, ALKPHOS, BILITOT, PROT, ALBUMIN in the last 168 hours. No results for input(s): LIPASE, AMYLASE in the last 168 hours. No results for input(s): AMMONIA in the last 168 hours. Coagulation profile Recent Labs  Lab 06/13/24  9094  INR 1.3*    CBC: Recent Labs  Lab 06/16/24 0426 06/17/24 0522 06/18/24 0547 06/19/24 0438  WBC 3.8* 4.0 3.5* 3.9*  HGB 7.6* 8.1* 7.5* 7.3*  HCT 23.7* 25.2* 24.1* 22.5*  MCV 92.2 91.6 93.4 91.5  PLT 77* 92* 90* 100*   Cardiac Enzymes: No results for input(s): CKTOTAL, CKMB, CKMBINDEX, TROPONINI in the last 168 hours. BNP: Invalid input(s): POCBNP CBG: Recent Labs  Lab 06/12/24 2135 06/13/24 1406  GLUCAP 173* 127*   D-Dimer No results for input(s): DDIMER in the last 72 hours. Hgb A1c No results for input(s): HGBA1C in the last 72 hours. Lipid Profile No results for input(s): CHOL, HDL, LDLCALC, TRIG, CHOLHDL, LDLDIRECT in the last 72 hours. Thyroid  function studies No results for input(s): TSH, T4TOTAL, T3FREE, THYROIDAB in the last 72 hours.  Invalid input(s): FREET3 Anemia work up No results for input(s): VITAMINB12, FOLATE, FERRITIN, TIBC, IRON, RETICCTPCT in the last 72 hours. Microbiology No results found for this or any previous visit (from the past 240 hours).   Discharge Instructions:   Discharge Instructions     Diet general   Complete by: As directed    Discharge instructions   Complete by: As directed    Follow-up with your primary care provider at the skilled nursing facility in 3 to 5 days for recheck blood work at that time.  Seek medical attention for worsening symptoms.   Increase activity slowly   Complete by: As directed    No wound care   Complete by: As directed       Allergies as of 06/19/2024   No Known Allergies      Medication List     STOP taking these medications    polyethylene glycol 17  g packet Commonly known as: MIRALAX  / GLYCOLAX        TAKE these medications    amLODipine  5 MG tablet Commonly known as: NORVASC  Take 1 tablet (5 mg total) by mouth daily.   bacitracin-polymyxin b ophthalmic ointment Commonly known as: POLYSPORIN Place into both eyes 2 (two) times daily. apply to eye every 12 hours while awake   escitalopram  10 MG tablet Commonly known as: LEXAPRO  Take 10 mg by mouth in the morning.   melatonin 5 MG Tabs Take 5 mg by mouth at bedtime as needed (for sleep).   multivitamin tablet Take 1 tablet by mouth daily with breakfast.   Myrbetriq  25 MG Tb24 tablet Generic drug: mirabegron  ER Take 25 mg by mouth in the morning.   ondansetron  4 MG tablet Commonly known as: ZOFRAN  Take 1 tablet (4 mg total) by mouth every 6 (six) hours as needed for nausea.   traMADol  50 MG tablet Commonly known as: ULTRAM  Take 1 tablet (50 mg total) by mouth every 6 (six) hours as needed for moderate pain (pain score 4-6).   TYLENOL  500 MG tablet Generic drug: acetaminophen  Take 500 mg by mouth every 6 (six) hours as needed (for pain).        Contact information for after-discharge care     Destination     [facility]   Service: Skilled Nursing Contact information: [address] [place]  [address] [phone]                      Time coordinating discharge: 39 minutes  Signed:  Yeshaya Vath  [facility] 06/19/2024, 12:30 PM

## 2024-06-19 NOTE — Progress Notes (Signed)
 Second attempt at report to [facility]. No answer.

## 2024-06-21 ENCOUNTER — Telehealth: Payer: Self-pay | Admitting: Hematology and Oncology

## 2024-06-21 ENCOUNTER — Ambulatory Visit: Payer: Self-pay | Admitting: Physician Assistant

## 2024-06-21 NOTE — Telephone Encounter (Signed)
 Patient appointments scheduled per 10/7/202 staff message. Voicemail left fot patient and reminders mailed to address on file.

## 2024-06-21 NOTE — Telephone Encounter (Signed)
-----   Message from [place] R sent at 06/20/2024 11:05 AM EDT ----- Caryl- please schedule labs and a follow up with Dr.Dorsey in 4 weeks.  ----- Message ----- From: Neomi Johnston ONEIDA DEVONNA Sent: 06/19/2024  10:09 PM EDT To: Lum LOISE Rands  Can you schedule labs and follow up with Dr. Federico in 4 weeks?  ----- Message ----- From: Federico Norleen ONEIDA MADISON, MD Sent: 06/13/2024  10:15 AM EDT To: Johnston ONEIDA Neomi, PA-C  OK to repeat PSMA scan, please request clinic visit with me 1 week after.  ----- Message ----- From: Neomi Johnston ONEIDA, [place] Sent: 06/04/2024   3:03 PM EDT To: Almarie DELENA Arabia, RN; Norleen ONEIDA Federico IV, MD  His PSA level is rising after completion of Pluvicto  therapy. Do you want to repeat PET PSMA to rule out progression? ----- Message ----- From: Interface, Lab In [place] Sent: 06/01/2024   3:19 PM EDT To: Johnston ONEIDA Neomi, PA-C

## 2024-06-28 ENCOUNTER — Encounter (HOSPITAL_COMMUNITY)
Admission: RE | Admit: 2024-06-28 | Discharge: 2024-06-28 | Disposition: A | Discharge status: Home / Self Care | Source: Ambulatory Visit | Attending: Physician Assistant | Admitting: Physician Assistant

## 2024-06-28 DIAGNOSIS — Z192 Hormone resistant malignancy status: Secondary | ICD-10-CM | POA: Insufficient documentation

## 2024-06-28 DIAGNOSIS — C61 Malignant neoplasm of prostate: Secondary | ICD-10-CM | POA: Insufficient documentation

## 2024-06-28 MED ORDER — FLOTUFOLASTAT F 18 GALLIUM 296-5846 MBQ/ML IV SOLN
8.1100 | Freq: Once | INTRAVENOUS | Status: AC
Start: 2024-06-28 — End: 2024-06-28
  Administered 2024-06-28: 8.11 via INTRAVENOUS

## 2024-06-30 ENCOUNTER — Telehealth: Payer: Self-pay | Admitting: *Deleted

## 2024-06-30 NOTE — Telephone Encounter (Signed)
 TCT patient regarding appts changes. Spoke with his cousin, Bernice Endo. She states he is in rehab @ [facility] Advised that we need to move up his appts to next week due to some changes that were seen on his PET Scan this week. Advised her of this new date and time for appts. Call made to [facility] where pt currently resides for rehab. Spoke with Diane in transportation and advised of new appt date and time. She will arrange his transportation.

## 2024-07-04 ENCOUNTER — Inpatient Hospital Stay: Admitting: Hematology and Oncology

## 2024-07-04 ENCOUNTER — Inpatient Hospital Stay: Attending: Physician Assistant

## 2024-07-04 VITALS — BP 102/62 | HR 78 | Temp 97.3°F | Resp 15 | Wt 109.8 lb

## 2024-07-04 DIAGNOSIS — Z807 Family history of other malignant neoplasms of lymphoid, hematopoietic and related tissues: Secondary | ICD-10-CM | POA: Diagnosis not present

## 2024-07-04 DIAGNOSIS — C7951 Secondary malignant neoplasm of bone: Secondary | ICD-10-CM | POA: Diagnosis present

## 2024-07-04 DIAGNOSIS — E895 Postprocedural testicular hypofunction: Secondary | ICD-10-CM | POA: Insufficient documentation

## 2024-07-04 DIAGNOSIS — Z79899 Other long term (current) drug therapy: Secondary | ICD-10-CM | POA: Insufficient documentation

## 2024-07-04 DIAGNOSIS — Z79818 Long term (current) use of other agents affecting estrogen receptors and estrogen levels: Secondary | ICD-10-CM | POA: Diagnosis not present

## 2024-07-04 DIAGNOSIS — C61 Malignant neoplasm of prostate: Secondary | ICD-10-CM

## 2024-07-04 DIAGNOSIS — C778 Secondary and unspecified malignant neoplasm of lymph nodes of multiple regions: Secondary | ICD-10-CM | POA: Insufficient documentation

## 2024-07-04 DIAGNOSIS — Z192 Hormone resistant malignancy status: Secondary | ICD-10-CM | POA: Diagnosis not present

## 2024-07-04 LAB — CBC WITH DIFFERENTIAL (CANCER CENTER ONLY)
Abs Immature Granulocytes: 0.02 K/uL (ref 0.00–0.07)
Basophils Absolute: 0 K/uL (ref 0.0–0.1)
Basophils Relative: 1 %
Eosinophils Absolute: 0.1 K/uL (ref 0.0–0.5)
Eosinophils Relative: 3 %
HCT: 27.3 % — ABNORMAL LOW (ref 39.0–52.0)
Hemoglobin: 9 g/dL — ABNORMAL LOW (ref 13.0–17.0)
Immature Granulocytes: 1 %
Lymphocytes Relative: 13 %
Lymphs Abs: 0.5 K/uL — ABNORMAL LOW (ref 0.7–4.0)
MCH: 30.2 pg (ref 26.0–34.0)
MCHC: 33 g/dL (ref 30.0–36.0)
MCV: 91.6 fL (ref 80.0–100.0)
Monocytes Absolute: 0.5 K/uL (ref 0.1–1.0)
Monocytes Relative: 15 %
Neutro Abs: 2.4 K/uL (ref 1.7–7.7)
Neutrophils Relative %: 67 %
Platelet Count: 152 K/uL (ref 150–400)
RBC: 2.98 MIL/uL — ABNORMAL LOW (ref 4.22–5.81)
RDW: 15.3 % (ref 11.5–15.5)
WBC Count: 3.5 K/uL — ABNORMAL LOW (ref 4.0–10.5)
nRBC: 0 % (ref 0.0–0.2)

## 2024-07-04 LAB — PSA: Prostatic Specific Antigen: >1500 ng/mL — ABNORMAL HIGH (ref 0.00–4.00)

## 2024-07-04 LAB — CMP (CANCER CENTER ONLY)
ALT: 6 U/L (ref 0–44)
AST: 22 U/L (ref 15–41)
Albumin: 3.4 g/dL — ABNORMAL LOW (ref 3.5–5.0)
Alkaline Phosphatase: 64 U/L (ref 38–126)
Anion gap: 7 (ref 5–15)
BUN: 26 mg/dL — ABNORMAL HIGH (ref 8–23)
CO2: 28 mmol/L (ref 22–32)
Calcium: 9.3 mg/dL (ref 8.9–10.3)
Chloride: 104 mmol/L (ref 98–111)
Creatinine: 1.14 mg/dL (ref 0.61–1.24)
GFR, Estimated: 60 mL/min — ABNORMAL LOW (ref >60)
Glucose, Bld: 119 mg/dL — ABNORMAL HIGH (ref 70–99)
Potassium: 4.4 mmol/L (ref 3.5–5.1)
Sodium: 139 mmol/L (ref 135–145)
Total Bilirubin: 0.6 mg/dL (ref 0.0–1.2)
Total Protein: 6.8 g/dL (ref 6.5–8.1)

## 2024-07-04 NOTE — Progress Notes (Signed)
 [facility] [phone]   [phone]  PROGRESS NOTE  Patient Care Team: Todd Charm, MD as PCP - General (Internal Medicine) Todd Pont, RN as Oncology Nurse Navigator  Hematological/Oncological History # Castration-Resistant Advanced Prostate Cancer  11/2021: started eligard  and Xtandi 160 mg PO daily.  08/12/2022: last visit with Dr. Amadeo 11/11/2022: transition care to Dr. Federico  01/20/2022: approximate start date of Zytiga  1000 mg PO daily with prednisone  5 mg 03/01/2023: PET PSMA showed progression of disease of metastatic adenopathy in the LEFT supraclavicular nodal station, LEFT paratracheal nodal station,pelvic adenopathy and retroperitoneal nodal stations.Interval progression of prostate cancer skeletal metastasis with several new lesions.  05/11/2023: Pluvicto  treatment (1 out of planned 6) 06/17/2023: Pluvicto  treatment (2 out of planned 6) 07/29/2023: Pluvicto  treatment (3 out of planned 6) 09/14/2023: Pluvicto  treatment (4 out of planned 6) 11/02/2023: Pluvicto  treatment (5 out of planned 6) 12/16/2023: Pluvicto  treatment (6 out of planned 6)  Interval History:  Todd Holmes 89 y.o. male with medical history significant for Castration-Resistant Advanced Prostate Cancer  presents for a follow up visit. He has completed his final pluvicto  treatment in April 2025. He is accompanied by his cousin for this visit.    Todd Holmes reports in the interim since her last visit he had nephrostomy tubes placed and also underwent a hernia operation.  He reports that he is not currently having any pain and his appetite remains good, though his weight is dropping.  He is down 10 pounds since May.  He reports that he does not have any fevers, chills, sweats and denies any runny nose, sore throat, cough.  He reports that he does have some occasional pink or blood in the nephrostomy tubes but it is not often.  He reports his energy levels are low.   He is at rehab and is walking fast with his walker.  He notes that he otherwise has been at his baseline level of health in the interim since her last visit.  A full 10 point ROS otherwise negative.  The bulk of our discussion focused on next steps given the progression of disease on PSMA and the rise of his PSA.  I discussed today would be hesitant to pursue chemotherapy and my preference would be for him to pursue hospice based care.  He and his cousin voiced understanding of this and were agreeable to establishing with hospice care.  We will place the referral today as requested.   MEDICAL HISTORY:  Past Medical History:  Diagnosis Date   Arthritis    knees   Cancer (HCC)    prostate, tx 2005- radiation    Hypertension    Keloid    keloid across chest, pt. unsure of how he encountered it   Memory disorder 11/28/2014   Neuromuscular disorder (HCC)    benign- tremor- occas., treated /w nadolol   Tremor 11/28/2014    SURGICAL HISTORY: Past Surgical History:  Procedure Laterality Date   CATARACT EXTRACTION W/PHACO  09/23/2011   Procedure: CATARACT EXTRACTION PHACO AND INTRAOCULAR LENS PLACEMENT (IOC);  Surgeon: Gaither Quan, MD;  Location: [facility] OR;  Service: Ophthalmology;  Laterality: Right;   EYE SURGERY Bilateral    L cataract removed & IOL   HERNIA REPAIR     bilateral hernia repair, inguinal, 1980's     INGUINAL HERNIA REPAIR Left 02/21/2024   Procedure: LAPAROSCOPIC REPAIR OF INCARCERATED LEFT INGUINAL HERNIA WITH MESH;  Surgeon: Kinsinger, Herlene Righter, MD;  Location: [facility] ORS;  Service:  General;  Laterality: Left;   IR NEPHROSTOMY PLACEMENT RIGHT  06/13/2024    SOCIAL HISTORY: Social History   Socioeconomic History   Marital status: Single    Spouse name: Not on file   Number of children: 0   Years of education: 14   Highest education level: Not on file  Occupational History   Occupation: retired  Tobacco Use   Smoking status: Never   Smokeless tobacco: Never  Substance  and Sexual Activity   Alcohol  use: No   Drug use: No   Sexual activity: Not on file  Other Topics Concern   Not on file  Social History Narrative   Patient is right handed.   Patient does not drink caffeine.   Social Drivers of Corporate investment banker Strain: Not on file  Food Insecurity: No Food Insecurity (06/11/2024)   Hunger Vital Sign    Worried About Running Out of Food in the Last Year: Never true    Ran Out of Food in the Last Year: Never true  Transportation Needs: No Transportation Needs (06/11/2024)   PRAPARE - Administrator, Civil Service (Medical): No    Lack of Transportation (Non-Medical): No  Physical Activity: Not on file  Stress: Not on file  Social Connections: Unknown (06/12/2024)   Social Connection and Isolation Panel    Frequency of Communication with Friends and Family: Not on file    Frequency of Social Gatherings with Friends and Family: Not on file    Attends Religious Services: Not on file    Active Member of Clubs or Organizations: Yes    Attends Banker Meetings: Not on file    Marital Status: Not on file  Intimate Partner Violence: Not At Risk (06/11/2024)   Humiliation, Afraid, Rape, and Kick questionnaire    Fear of Current or Ex-Partner: No    Emotionally Abused: No    Physically Abused: No    Sexually Abused: No    FAMILY HISTORY: Family History  Problem Relation Age of Onset   Anesthesia problems Neg Hx    Hypotension Neg Hx    Malignant hyperthermia Neg Hx    Pseudochol deficiency Neg Hx    Cancer Mother    Stroke Father    Lymphoma Brother     ALLERGIES:  has no known allergies.  MEDICATIONS:  Current Outpatient Medications  Medication Sig Dispense Refill   amLODipine  (NORVASC ) 5 MG tablet Take 1 tablet (5 mg total) by mouth daily.     bacitracin-polymyxin b (POLYSPORIN) ophthalmic ointment Place into both eyes 2 (two) times daily. apply to eye every 12 hours while awake 3.5 g 0   escitalopram   (LEXAPRO ) 10 MG tablet Take 10 mg by mouth in the morning.     melatonin 5 MG TABS Take 5 mg by mouth at bedtime as needed (for sleep).     Multiple Vitamin (MULTIVITAMIN) tablet Take 1 tablet by mouth daily with breakfast.     MYRBETRIQ  25 MG TB24 tablet Take 25 mg by mouth in the morning.     ondansetron  (ZOFRAN ) 4 MG tablet Take 1 tablet (4 mg total) by mouth every 6 (six) hours as needed for nausea. 20 tablet 0   traMADol  (ULTRAM ) 50 MG tablet Take 1 tablet (50 mg total) by mouth every 6 (six) hours as needed for moderate pain (pain score 4-6). 10 tablet 0   TYLENOL  500 MG tablet Take 500 mg by mouth every 6 (six) hours as needed (  for pain).     No current facility-administered medications for this visit.    REVIEW OF SYSTEMS:   Constitutional: ( - ) fevers, ( - )  chills , ( - ) night sweats Eyes: ( - ) blurriness of vision, ( - ) double vision, ( - ) watery eyes Ears, nose, mouth, throat, and face: ( - ) mucositis, ( - ) sore throat Respiratory: ( - ) cough, ( - ) dyspnea, ( - ) wheezes Cardiovascular: ( - ) palpitation, ( - ) chest discomfort, ( - ) lower extremity swelling Gastrointestinal:  ( - ) nausea, ( - ) heartburn, ( - ) change in bowel habits Skin: ( - ) abnormal skin rashes Lymphatics: ( - ) new lymphadenopathy, ( - ) easy bruising Neurological: ( - ) numbness, ( - ) tingling, ( - ) new weaknesses Behavioral/Psych: ( - ) mood change, ( - ) new changes  All other systems were reviewed with the patient and are negative.  PHYSICAL EXAMINATION: Vitals:   07/04/24 1022  BP: 102/62  Pulse: 78  Resp: 15  Temp: (!) 97.3 F (36.3 C)  SpO2: 100%        Constitutional: Oriented to person, place, and time and well-developed, well-nourished, and in no distress.  HENT:  Head: Normocephalic and atraumatic.  Eyes: Conjunctivae are normal. Right eye exhibits no discharge. Left eye exhibits no discharge. No scleral icterus.  Cardiovascular: Normal rate, regular rhythm,  normal heart sounds Pulmonary/Chest: Effort normal and breath sounds normal. No respiratory distress. No wheezes. No rales.  Musculoskeletal: Normal range of motion. Exhibits no edema.  Neurological: Alert and oriented to person, place, and time. Exhibits normal muscle tone. Gait normal. Coordination normal.  Skin: Skin is warm and dry. No rash noted. Not diaphoretic. No erythema. No pallor.  Psychiatric: Mood, memory and judgment normal.    LABORATORY DATA:  I have reviewed the data as listed    Latest Ref Rng & Units 07/04/2024    9:54 AM 06/19/2024    4:38 AM 06/18/2024    5:47 AM  CBC  WBC 4.0 - 10.5 K/uL 3.5  3.9  3.5   Hemoglobin 13.0 - 17.0 g/dL 9.0  7.3  7.5   Hematocrit 39.0 - 52.0 % 27.3  22.5  24.1   Platelets 150 - 400 K/uL 152  100  90        Latest Ref Rng & Units 07/04/2024    9:54 AM 06/19/2024    4:38 AM 06/18/2024    5:47 AM  CMP  Glucose 70 - 99 mg/dL 880  89  98   BUN 8 - 23 mg/dL 26  15  16    Creatinine 0.61 - 1.24 mg/dL 8.85  8.93  9.16   Sodium 135 - 145 mmol/L 139  141  142   Potassium 3.5 - 5.1 mmol/L 4.4  3.6  3.5   Chloride 98 - 111 mmol/L 104  107  108   CO2 22 - 32 mmol/L 28  26  25    Calcium 8.9 - 10.3 mg/dL 9.3  7.8  7.6   Total Protein 6.5 - 8.1 g/dL 6.8     Total Bilirubin 0.0 - 1.2 mg/dL 0.6     Alkaline Phos 38 - 126 U/L 64     AST 15 - 41 U/L 22     ALT 0 - 44 U/L 6       RADIOGRAPHIC STUDIES: NM PET (PSMA) SKULL TO MID THIGH Result Date:  06/29/2024 EXAM: PROSTATE PET SKULL BASE TO MID THIGHS 06/28/2024 05:50:00 PM TECHNIQUE: RADIOPHARMACEUTICAL: 8.11 mCi F-18 flotufolastaf (Posluma) injected intravenously. PET imaging was obtained from skull vertex to mid thighs. Computed tomography was used for attenuation correction and localization. Fusion imaging was obtained. Uptake time  mins. COMPARISON: PET of 03/01/2023 and abdominal pelvic CT of 06/11/2024. CLINICAL HISTORY: Prostate cancer, metastatic, assess treatment response. FINDINGS:  PROSTATE AND PROSTATE BED: Radiation seeds in the prostate. LYMPH NODES: Left supraclavicular nodal metastasis is improved, measuring 11 mm with SUV 13.8 on image 40/4, versus 2.2 cm with SUV 45 on the prior study. New tracer avid mediastinal and right hilar nodes, including a subcarinal node of 9 mm with SUV 20.8 on image 62/4. SABRA Confluent abdominal retroperitoneal nodal metastasis, measuring 3.9 cm with SUV 28.3 on image 117/4, compared to 4.7 cm with SUV 26.0 on the prior study. Diffuse pelvic nodal metastasis, including throughout the peri. rectal space. Example in the deep right pelvis at 2.4 cm with SUV 21.8 on image 142/4, compared to 1.8 cm with SUV 29.4 on the prior study. Infiltrative anterior left pelvic nodal metastasis, new or progressive, measuring 3.6 cm with SUV 13.3 on image 138/4. BONES: Diffuse osseous metastasis. These are progressive, primarily evidenced by increased relatively diffuse spinal lesions. Left posterior iliac lesion measures SUV 44.1 today versus SUV 67.1 on the prior study. OTHER PET FINDINGS: Expected cerebral atrophy for age. New tracer avid hepatic metastasis, example within the anterior right hepatic lobe 1.1 cm with SUV 35.4 on image 96/4. Bilateral nephrostomy catheters in place without significant hydronephrosis. Chronic calcific pancreatitis. Physiologic activity within the salivary glands, liver, spleen, kidneys, bowel, and urinary bladder. OTHER CT FINDINGS: New trace right pleural effusion. Aortic atherosclerosis. IMPRESSION: 1. Since the prior PET of 03/01/2023, mildly progressive disease. 2. New hepatic, mediastinal nodal and significantly progressive osseous metastasis. 3. Some sites of disease are improved, including a left supraclavicular nodal lesion. Low abdominal retroperitoneal nodal disease is decreased in size and hypermetabolism 4. New tiny right pleural effusion. Bilateral nephrostomy catheters in place, without significant hydronephrosis. 5. Incidental  findings, including: Chronic calcific pancreatitis. Aortic atherosclerosis ([id number]) Electronically signed by: Rockey Kilts MD 06/29/2024 08:34 AM EDT RP Workstation: [id number]   CT L-SPINE NO CHARGE Addendum Date: 06/17/2024 ADDENDUM REPORT: 06/17/2024 14:00 ADDENDUM: Per CT technologist Summer Tanbouz the CT images placed on patient's record is for different patient and was done so in error. Physicians were notified of results at images placed in the correct patient's record. PA Leita Chancy was notified to disregard there is report for Ercil Cassis due to incorrect images paste on the patient's record by CT technologist and I 05/03/2024 at 5:43 p.m. Electronically Signed   By: Leita Birmingham M.D.   On: 06/17/2024 14:00   Result Date: 06/17/2024 CLINICAL DATA:  Left flank pain. Stage IV metastatic prostate cancer. EXAM: CT LUMBAR SPINE WITHOUT CONTRAST TECHNIQUE: Multidetector CT imaging of the lumbar spine was performed without intravenous contrast administration. Multiplanar CT image reconstructions were also generated. RADIATION DOSE REDUCTION: This exam was performed according to the departmental dose-optimization program which includes automated exposure control, adjustment of the mA and/or kV according to patient size and/or use of iterative reconstruction technique. COMPARISON:  None Available. FINDINGS: Segmentation: 5 lumbar type vertebrae. Alignment: Normal. Vertebrae: No acute fracture is seen. Osteopenia is noted. No focal lytic or destructive lesion is seen. Paraspinal and other soft tissues: No acute abnormality. Please see CT abdomen and pelvis for additional information. Disc levels: There  is multilevel intervertebral disc space narrowing, degenerative endplate changes, disc herniations and facet arthropathy resulting in mild-to-moderate spinal canal and neural foraminal stenosis. There is severe spinal canal stenosis at L3-L4. IMPRESSION: 1. No acute fracture or lytic or destructive  lesion. 2. Multilevel degenerative changes in the lumbar spine as described above. Electronically Signed: By: Leita Birmingham M.D. On: 06/11/2024 16:05   IR NEPHROSTOMY PLACEMENT BILATERAL Result Date: 06/13/2024 INDICATION: Nephrostomy placement Briefly, 89 year old male with a history of prostate cancer with obstructing hydronephrosis. EXAM: ULTRASOUND AND FLUOROSCOPIC GUIDED PLACEMENT OF BILATERAL NEPHROSTOMY TUBES COMPARISON:  CT AP, 06/11/2024. MEDICATIONS: Rocephin 1 gm IV; The antibiotic was administered in an appropriate time frame prior to skin puncture. ANESTHESIA/SEDATION: Moderate (conscious) sedation was employed during this procedure. A total of Versed  1.5 mg and Fentanyl  50 mcg was administered intravenously. Moderate Sedation Time: 48 minutes. The patient's level of consciousness and vital signs were monitored continuously by radiology nursing throughout the procedure under my direct supervision. CONTRAST:  20 mL Isovue 300 - administered into the renal collecting system FLUOROSCOPY: Radiation Exposure Index and estimated peak skin dose (PSD); Reference air kerma (RAK), 14.4 mGy. COMPLICATIONS: None immediate. PROCEDURE: The procedure, risks, benefits, and alternatives were explained to the patient and/or patient's representative, questions were encouraged and answered and informed consent was obtained. A timeout was performed prior to the initiation of the procedure. The operative site was prepped and draped in the usual sterile fashion and a sterile drape was applied covering the operative field. A sterile gown and sterile gloves were used for the procedure. Local anesthesia was provided with 1% Lidocaine  with epinephrine . The procedure began of the patient's LEFT side. Ultrasound was used to localize the LEFT kidney. Under direct ultrasound guidance, a 20 gauge needle was advanced into the renal collecting system. An ultrasound image documentation was performed. Access within the collecting  system was confirmed with the efflux of urine followed by limited contrast injection. Under intermittent fluoroscopic guidance, an 0.018 wire was advanced into the collecting system and the tract was dilated with an Accustick stent. Next, over a short Amplatz wire, the track was further dilated ultimately allowing placement of a 10 Fr percutaneous nephrostomy catheter with end coiled and locked within the LEFT renal pelvis. The procedure was then repeated on the patient's RIGHT side, allowing placement of a 10 Fr percutaneous nephrostomy catheter with end coiled and locked within the RIGHT renal pelvis. Contrast was injected and several spot fluoroscopic images were obtained in various obliquities. The catheters were secured at the skin entrance site with an interrupted suture and a stat lock device and connected to a gravity bag. Dressings were applied. The patient tolerated procedure well without immediate postprocedural complication. FINDINGS: *Ultrasound scanning demonstrates a moderately dilated BILATERAL collecting systems. *Under a combination of ultrasound and fluoroscopic guidance, a posterior inferior calices were targeted allowing placement of a 10 Fr percutaneous nephrostomy catheter with end coiled and locked within the renal pelves. Contrast injection confirmed appropriate positioning. *Small volume extraluminal extravasation about the RIGHT renal pelvis, similar appearance to comparison CT and suspicious for caliceal rupture. IMPRESSION: Successful ultrasound and fluoroscopic guided placement of a BILATERAL 10 Fr percutaneous nephrostomy drainage catheters. RECOMMENDATIONS: The patient will return to [facility] ([facility]) for routine drainage catheter evaluation and exchange in 8 weeks. Thom Hall, MD [facility] Electronically Signed   By: Thom Hall M.D.   On: 06/13/2024 16:44   DG C-Arm 1-60 Min-No Report Result  Date:  06/13/2024 Fluoroscopy was utilized by the requesting physician.  No radiographic interpretation.   CT L-SPINE NO CHARGE Result Date: 06/11/2024 CLINICAL DATA:  left flank pain starting yesterday. Sent here by UC after UA dip. Has stage four metastatic prostate cancer and has been losing weight. Denies any current urinary symptoms. EXAM: CT LUMBAR SPINE WITHOUT CONTRAST TECHNIQUE: Multidetector CT imaging of the lumbar spine was performed without intravenous contrast administration. Multiplanar CT image reconstructions were also generated. RADIATION DOSE REDUCTION: This exam was performed according to the departmental dose-optimization program which includes automated exposure control, adjustment of the mA and/or kV according to patient size and/or use of iterative reconstruction technique. COMPARISON:  CT abdomen pelvis 02/20/2024 FINDINGS: Segmentation: 5 lumbar type vertebrae. Alignment: Similar-appearing levoscoliosis of the lumbar spine centered at the L3-L4 level. Vertebrae: Diffusely decreased bone density. Similar-appearing scattered blastic osseous metastasis of the lumbar spine. Redemonstration of lytic and blastic osseous lesions of the sacrum. No acute fracture or focal pathologic process. Paraspinal and other soft tissues: Negative. Disc levels: Multilevel intervertebral disc space narrowing and vacuum phenomenon. IMPRESSION: 1. Similar-appearing scattered blastic osseous metastasis of the lumbar spine. Redemonstration of lytic and blastic osseous lesions of the sacrum. 2. No acute displaced fracture or traumatic listhesis of the lumbar spine. 3. Diffusely decreased bone density. Electronically Signed   By: Morgane  Naveau M.D.   On: 06/11/2024 19:16   CT ABDOMEN PELVIS WO CONTRAST Result Date: 06/11/2024 CLINICAL DATA:  Abdominal pain, acute, nonlocalized left flank pain EXAM: CT ABDOMEN AND PELVIS WITHOUT CONTRAST TECHNIQUE: Multidetector CT imaging of the abdomen and pelvis was performed  following the standard protocol without IV contrast. RADIATION DOSE REDUCTION: This exam was performed according to the departmental dose-optimization program which includes automated exposure control, adjustment of the mA and/or kV according to patient size and/or use of iterative reconstruction technique. COMPARISON:  CT abdomen pelvis 02/20/2024 FINDINGS: Lower chest: Trace left pleural effusion.  Tiny hiatal hernia. Hepatobiliary: No focal liver abnormality. No gallstones, gallbladder wall thickening, or pericholecystic fluid. No biliary dilatation. Pancreas: Diffuse coarse calcifications. Diffusely atrophic. No focal lesion. Otherwise normal pancreatic contour. No surrounding inflammatory changes. No main pancreatic ductal dilatation. ductal dilatation. Spleen: Normal in size without focal abnormality. Adrenals/Urinary Tract: No adrenal nodule bilaterally. Interval development of bilateral mild to moderate hydroureteronephrosis. No nephrolithiasis. No ureterolithiasis. The urinary bladder is unremarkable. Stomach/Bowel: Stomach is within normal limits. No evidence of bowel wall thickening or dilatation. Diffuse colonic diverticulosis. Appendix appears normal. Vascular/Lymphatic: Phleboliths noted along the pelvis. No abdominal aorta or iliac aneurysm. Moderate atherosclerotic plaque of the aorta and its branches. Difficult to measure on this noncontrast study persistent retroperitoneal and pelvic lymphadenopathy. Reproductive: Prostate is unremarkable with radiation seeds noted. Other: Interval development of diffuse mild mesenteric edema. Interval development of trace volume intraperitoneal free fluid. No intraperitoneal free gas. No organized fluid collection. Musculoskeletal: Status post left inguinal hernia repair with mesh. No recurrent hernia. No abdominal wall hernia or abnormality. Lytic and blastic metastatic lesions of the sacrum again noted. Please see separately dictated CT lumbar spine 06/11/2024.  No acute displaced fracture. IMPRESSION: 1. Interval development of bilateral mild to moderate hydroureteronephrosis. No nephroureterolithiasis bilaterally. This may reflect the changes of obstructive uropathy or reflux. 2. Trace left pleural effusion. 3. Interval development of mild mesenteric edema and trace volume simple free fluid ascites. 4. Tiny hiatal hernia. 5. Chronic pancreatitis with no findings of acute pancreatitis. 6. Colonic diverticulosis with no acute diverticulitis. 7. Difficult to measure on this noncontrast study persistent  retroperitoneal and pelvic lymphadenopathy. Persistent lytic and blastic metastatic lesions in a patient with known prostate cancer. Electronically Signed   By: Morgane  Naveau M.D.   On: 06/11/2024 19:10   CT ABDOMEN PELVIS WO CONTRAST Addendum Date: 06/11/2024 ADDENDUM REPORT: 06/11/2024 17:49 ADDENDUM: PA Leita Chancy was notified to disregard this report for Osualdo Hansell due to incorrect images placed on patient's record by CT technologist. Critical Value/emergent results were called by telephone on 06/11/2024 at 5:43 pm to provider LEITA MATTER , who verbally acknowledged these results and is taking care of patient with images provided. New report will be generated within images are moved to the correct patient. Electronically Signed   By: Leita Birmingham M.D.   On: 06/11/2024 17:49   Addendum Date: 06/11/2024 ADDENDUM REPORT: 06/11/2024 17:31 ADDENDUM: Per CT technologist Summer Tanbouz the CT images placed on patient's record is a for a different patient. Physicians will be notified of results and images placed in the correct patient's record. Electronically Signed   By: Leita Birmingham M.D.   On: 06/11/2024 17:31   Addendum Date: 06/11/2024 ADDENDUM REPORT: 06/11/2024 16:01 ADDENDUM: Critical Value/emergent results were called by telephone at the time of interpretation on 06/11/2024 at 4:01 pm to provider LEITA CHANCY , who verbally acknowledged these results.  Electronically Signed   By: Leita Birmingham M.D.   On: 06/11/2024 16:01   Result Date: 06/11/2024 CLINICAL DATA:  Left lower quadrant abdominal pain. Left flank pain starting yesterday. History of stage IV metastatic prostate cancer. EXAM: CT ABDOMEN AND PELVIS WITHOUT CONTRAST TECHNIQUE: Multidetector CT imaging of the abdomen and pelvis was performed following the standard protocol without IV contrast. RADIATION DOSE REDUCTION: This exam was performed according to the departmental dose-optimization program which includes automated exposure control, adjustment of the mA and/or kV according to patient size and/or use of iterative reconstruction technique. COMPARISON:  02/20/2024. FINDINGS: Lower chest: Multi-vessel coronary artery calcifications are noted. Mild atelectasis is present at the lung bases. Hepatobiliary: No focal liver abnormality is seen. No biliary ductal dilatation. Stones are seen within the gallbladder. Pancreas: Unremarkable. No pancreatic ductal dilatation or surrounding inflammatory changes. Spleen: Normal in size without focal abnormality. Adrenals/Urinary Tract: There is thickening of the adrenal glands bilaterally without evidence of discrete nodule., possible hyperplasia. A nonobstructive renal calculus is noted on the left. Arterial calcifications are also seen on the left. No hydronephrosis bilaterally. There is mild thickening of the anterior bladder wall with associated inflammatory changes. Stomach/Bowel: The stomach is within normal limits. No bowel obstruction. There is pneumatosis involving a segment of small bowel the mid left abdomen, axial image 46 with associated foci of free air is in the left abdomen. There is a ventral abdominal wall hernia containing nonobstructed small bowel. The appendix is not well delineated. Vascular/Lymphatic: Aortic atherosclerosis. No enlarged abdominal or pelvic lymph nodes. Reproductive: Prostate gland is mildly enlarged. Other: No abdominopelvic  ascites. Musculoskeletal: Degenerative changes are present in the thoracolumbar spine. Please see CT lumbar spine for additional information. IMPRESSION: 1. Segment of small bowel in the the left abdomen with pneumatosis and associated foci of free air, uncertain ideology and perforation cannot be excluded. Surgical consultation is recommended. 2. Bladder wall thickening with associated inflammatory changes, possible infectious or inflammatory cystitis. 3. Low anterior abdominal wall hernia containing nonobstructed small bowel. 4. Nonobstructive left renal calculus. 5. Aortic atherosclerosis. Electronically Signed: By: Leita Birmingham M.D. On: 06/11/2024 15:57   DG Chest Port 1 View Result Date: 06/11/2024 CLINICAL DATA:  Questionable  sepsis-evaluate for abnormality. EXAM: PORTABLE CHEST 1 VIEW COMPARISON:  Radiographs 02/21/2024 and 09/11/2011. Abdominal CT 06/11/2024. FINDINGS: 1625 hours. Low lung volumes with mildly increased atelectasis at both lung bases. The heart size and mediastinal contours are stable with aortic and brachycephalic tortuosity. There is no confluent airspace disease, pneumothorax or significant pleural effusion. Thoracolumbar scoliosis without evidence of acute osseous abnormality. IMPRESSION: Low lung volumes with mildly increased bibasilar atelectasis. No evidence of pneumonia. Electronically Signed   By: Elsie Perone M.D.   On: 06/11/2024 16:57    ASSESSMENT & PLAN ITAY MELLA is a 89 y.o. male with medical history significant for Castration-Resistant Advanced Prostate Cancer  presents for a follow up visit.   # Castration-Resistant Advanced Prostate Cancer  --Continue Eligard  therapy with urology.  Continue to receive as scheduled. --Patient completed a full 6 cycles of Pluvicto  therapy --Labs from today showed WBC 3.5, hemoglobin 9.0, MCV 91.6, platelets 152.  Creatinine and LFTs normal.  --Patient is having progression of disease based on rising PSA and PSMA scan  showing progression. --Today I discussed the options moving forward.  I do anticipate he is less than 6 months of life remaining.  He was agreeable to referral to hospice. -- Return to clinic in 4 weeks.    No orders of the defined types were placed in this encounter.  All questions were answered. The patient knows to call the clinic with any problems, questions or concerns.   I have spent a total of 30 minutes minutes of face-to-face and non-face-to-face time, preparing to see the patient, performing a medically appropriate examination, counseling and educating the patient,  documenting clinical information in the electronic health record, independently interpreting results and communicating results to the patient, and care coordination.    Norleen IVAR Kidney, MD Department of Hematology/Oncology [facility] Phone: [phone] Pager: [phone] Email: [email or web address]    07/04/2024 3:29 PM

## 2024-07-05 ENCOUNTER — Other Ambulatory Visit: Payer: Self-pay | Admitting: *Deleted

## 2024-07-05 ENCOUNTER — Telehealth: Payer: Self-pay | Admitting: *Deleted

## 2024-07-05 DIAGNOSIS — C61 Malignant neoplasm of prostate: Secondary | ICD-10-CM

## 2024-07-05 LAB — TESTOSTERONE: Testosterone: <3 ng/dL — ABNORMAL LOW (ref 264–916)

## 2024-07-05 NOTE — Telephone Encounter (Signed)
 Received verbal order from Dr. Federico for Hospice. Contacted [facility] and provided patient's information to [employer].

## 2024-07-28 ENCOUNTER — Other Ambulatory Visit

## 2024-07-28 ENCOUNTER — Ambulatory Visit: Admitting: Hematology and Oncology

## 2024-08-03 ENCOUNTER — Other Ambulatory Visit: Payer: Self-pay | Admitting: Hematology and Oncology

## 2024-08-03 ENCOUNTER — Inpatient Hospital Stay: Attending: Physician Assistant

## 2024-08-03 ENCOUNTER — Inpatient Hospital Stay: Admitting: Hematology and Oncology

## 2024-08-03 VITALS — BP 105/66 | HR 76 | Temp 97.9°F | Resp 14 | Wt 103.8 lb

## 2024-08-03 DIAGNOSIS — I10 Essential (primary) hypertension: Secondary | ICD-10-CM

## 2024-08-03 DIAGNOSIS — Z192 Hormone resistant malignancy status: Secondary | ICD-10-CM | POA: Diagnosis not present

## 2024-08-03 DIAGNOSIS — C61 Malignant neoplasm of prostate: Secondary | ICD-10-CM | POA: Diagnosis not present

## 2024-08-03 LAB — CBC WITH DIFFERENTIAL (CANCER CENTER ONLY)
Abs Immature Granulocytes: 0.04 K/uL (ref 0.00–0.07)
Basophils Absolute: 0 K/uL (ref 0.0–0.1)
Basophils Relative: 1 %
Eosinophils Absolute: 0 K/uL (ref 0.0–0.5)
Eosinophils Relative: 1 %
HCT: 24.5 % — ABNORMAL LOW (ref 39.0–52.0)
Hemoglobin: 8.2 g/dL — ABNORMAL LOW (ref 13.0–17.0)
Immature Granulocytes: 1 %
Lymphocytes Relative: 15 %
Lymphs Abs: 0.6 K/uL — ABNORMAL LOW (ref 0.7–4.0)
MCH: 29.7 pg (ref 26.0–34.0)
MCHC: 33.5 g/dL (ref 30.0–36.0)
MCV: 88.8 fL (ref 80.0–100.0)
Monocytes Absolute: 0.5 K/uL (ref 0.1–1.0)
Monocytes Relative: 12 %
Neutro Abs: 3 K/uL (ref 1.7–7.7)
Neutrophils Relative %: 70 %
Platelet Count: 167 K/uL (ref 150–400)
RBC: 2.76 MIL/uL — ABNORMAL LOW (ref 4.22–5.81)
RDW: 14.6 % (ref 11.5–15.5)
WBC Count: 4.2 K/uL (ref 4.0–10.5)
nRBC: 0 % (ref 0.0–0.2)

## 2024-08-03 LAB — CMP (CANCER CENTER ONLY)
ALT: 6 U/L (ref 0–44)
AST: 41 U/L (ref 15–41)
Albumin: 3.5 g/dL (ref 3.5–5.0)
Alkaline Phosphatase: 66 U/L (ref 38–126)
Anion gap: 12 (ref 5–15)
BUN: 43 mg/dL — ABNORMAL HIGH (ref 8–23)
CO2: 24 mmol/L (ref 22–32)
Calcium: 9.4 mg/dL (ref 8.9–10.3)
Chloride: 99 mmol/L (ref 98–111)
Creatinine: 1.16 mg/dL (ref 0.61–1.24)
GFR, Estimated: 59 mL/min — ABNORMAL LOW (ref >60)
Glucose, Bld: 114 mg/dL — ABNORMAL HIGH (ref 70–99)
Potassium: 4.3 mmol/L (ref 3.5–5.1)
Sodium: 136 mmol/L (ref 135–145)
Total Bilirubin: 0.4 mg/dL (ref 0.0–1.2)
Total Protein: 7.4 g/dL (ref 6.5–8.1)

## 2024-08-03 LAB — IRON AND IRON BINDING CAPACITY (CC-WL,HP ONLY)
Iron: 23 ug/dL — ABNORMAL LOW (ref 45–182)
Saturation Ratios: 16 % — ABNORMAL LOW (ref 17.9–39.5)
TIBC: 147 ug/dL — ABNORMAL LOW (ref 250–450)
UIBC: 124 ug/dL

## 2024-08-03 LAB — RETIC PANEL
Immature Retic Fract: 25.6 % — ABNORMAL HIGH (ref 2.3–15.9)
RBC.: 2.69 MIL/uL — ABNORMAL LOW (ref 4.22–5.81)
Retic Count, Absolute: 40.1 K/uL (ref 19.0–186.0)
Retic Ct Pct: 1.5 % (ref 0.4–3.1)
Reticulocyte Hemoglobin: 30.3 pg (ref >27.9)

## 2024-08-03 LAB — PSA: Prostatic Specific Antigen: >1500 ng/mL — ABNORMAL HIGH (ref 0.00–4.00)

## 2024-08-03 LAB — FERRITIN: Ferritin: 869 ng/mL — ABNORMAL HIGH (ref 24–336)

## 2024-08-04 LAB — TESTOSTERONE: Testosterone: <3 ng/dL — ABNORMAL LOW (ref 264–916)

## 2024-08-05 MED ORDER — TRAMADOL HCL 50 MG PO TABS: 50.0000 mg | ORAL_TABLET | Freq: Four times a day (QID) | ORAL | 0 refills | Status: DC | PRN

## 2024-08-05 NOTE — Progress Notes (Signed)
 [facility] Telephone:([phone]) [phone]   [phone]  PROGRESS NOTE  Patient Care Team: Elliot Charm, MD as PCP - General (Internal Medicine) Vertell Pont, RN as Oncology Nurse Navigator  Hematological/Oncological History # Castration-Resistant Advanced Prostate Cancer  11/2021: started eligard  and Xtandi 160 mg PO daily.  08/12/2022: last visit with Dr. Amadeo 11/11/2022: transition care to Dr. Federico  01/20/2022: approximate start date of Zytiga  1000 mg PO daily with prednisone  5 mg 03/01/2023: PET PSMA showed progression of disease of metastatic adenopathy in the LEFT supraclavicular nodal station, LEFT paratracheal nodal station,pelvic adenopathy and retroperitoneal nodal stations.Interval progression of prostate cancer skeletal metastasis with several new lesions.  05/11/2023: Pluvicto  treatment (1 out of planned 6) 06/17/2023: Pluvicto  treatment (2 out of planned 6) 07/29/2023: Pluvicto  treatment (3 out of planned 6) 09/14/2023: Pluvicto  treatment (4 out of planned 6) 11/02/2023: Pluvicto  treatment (5 out of planned 6) 12/16/2023: Pluvicto  treatment (6 out of planned 6)  Interval History:  Todd Holmes 89 y.o. male with medical history significant for Castration-Resistant Advanced Prostate Cancer  presents for a follow up visit. He has completed his final pluvicto  treatment in April 2025. He is accompanied by his cousin for this visit.    Todd Holmes reports he continues to have a decline in appetite and energy since our last visit.  Fortunately he is not having any pain with exception of side of his tongue that he bit.  He reports that he has had about 6 pounds of weight loss.  He reports that he continues to decline.  He has had no recent issues with fevers, chills, sweats, nausea, vomiting or diarrhea.  He reports that he is not having any urinary symptoms.  Overall he is not having any focal symptoms but continues a general decline.  He is not having any  difficulty with his urostomy tubes such as pain or bleeding in the tubes.  Full 10 point ROS otherwise negative.  The bulk of our discussion focused on next steps.  Most unfortunately he has not established with hospice care in the interim since our last visit.  It appears that there was miscommunication between his facility and the hospice agency.  My nurse has called the hospice agency to reconnect to the patient.   MEDICAL HISTORY:  Past Medical History:  Diagnosis Date   Arthritis    knees   Cancer (HCC)    prostate, tx 2005- radiation    Hypertension    Keloid    keloid across chest, pt. unsure of how he encountered it   Memory disorder 11/28/2014   Neuromuscular disorder (HCC)    benign- tremor- occas., treated /w nadolol   Tremor 11/28/2014    SURGICAL HISTORY: Past Surgical History:  Procedure Laterality Date   CATARACT EXTRACTION W/PHACO  09/23/2011   Procedure: CATARACT EXTRACTION PHACO AND INTRAOCULAR LENS PLACEMENT (IOC);  Surgeon: Gaither Quan, MD;  Location: [facility] OR;  Service: Ophthalmology;  Laterality: Right;   EYE SURGERY Bilateral    L cataract removed & IOL   HERNIA REPAIR     bilateral hernia repair, inguinal, 1980's     INGUINAL HERNIA REPAIR Left 02/21/2024   Procedure: LAPAROSCOPIC REPAIR OF INCARCERATED LEFT INGUINAL HERNIA WITH MESH;  Surgeon: Kinsinger, Herlene Righter, MD;  Location: [facility] ORS;  Service: General;  Laterality: Left;   IR NEPHROSTOMY PLACEMENT RIGHT  06/13/2024    SOCIAL HISTORY: Social History   Socioeconomic History   Marital status: Single    Spouse name: Not on  file   Number of children: 0   Years of education: 14   Highest education level: Not on file  Occupational History   Occupation: retired  Tobacco Use   Smoking status: Never   Smokeless tobacco: Never  Substance and Sexual Activity   Alcohol  use: No   Drug use: No   Sexual activity: Not on file  Other Topics Concern   Not on file  Social History Narrative   Patient is right  handed.   Patient does not drink caffeine.   Social Drivers of Corporate Investment Banker Strain: Not on file  Food Insecurity: No Food Insecurity (06/11/2024)   Hunger Vital Sign    Worried About Running Out of Food in the Last Year: Never true    Ran Out of Food in the Last Year: Never true  Transportation Needs: No Transportation Needs (06/11/2024)   PRAPARE - Administrator, Civil Service (Medical): No    Lack of Transportation (Non-Medical): No  Physical Activity: Not on file  Stress: Not on file  Social Connections: Unknown (06/12/2024)   Social Connection and Isolation Panel    Frequency of Communication with Friends and Family: Not on file    Frequency of Social Gatherings with Friends and Family: Not on file    Attends Religious Services: Not on file    Active Member of Clubs or Organizations: Yes    Attends Banker Meetings: Not on file    Marital Status: Not on file  Intimate Partner Violence: Not At Risk (06/11/2024)   Humiliation, Afraid, Rape, and Kick questionnaire    Fear of Current or Ex-Partner: No    Emotionally Abused: No    Physically Abused: No    Sexually Abused: No    FAMILY HISTORY: Family History  Problem Relation Age of Onset   Anesthesia problems Neg Hx    Hypotension Neg Hx    Malignant hyperthermia Neg Hx    Pseudochol deficiency Neg Hx    Cancer Mother    Stroke Father    Lymphoma Brother     ALLERGIES:  has no known allergies.  MEDICATIONS:  Current Outpatient Medications  Medication Sig Dispense Refill   amLODipine  (NORVASC ) 5 MG tablet Take 1 tablet (5 mg total) by mouth daily.     bacitracin -polymyxin b  (POLYSPORIN ) ophthalmic ointment Place into both eyes 2 (two) times daily. apply to eye every 12 hours while awake 3.5 g 0   escitalopram  (LEXAPRO ) 10 MG tablet Take 10 mg by mouth in the morning.     melatonin 5 MG TABS Take 5 mg by mouth at bedtime as needed (for sleep).     Multiple Vitamin (MULTIVITAMIN)  tablet Take 1 tablet by mouth daily with breakfast.     MYRBETRIQ  25 MG TB24 tablet Take 25 mg by mouth in the morning.     ondansetron  (ZOFRAN ) 4 MG tablet Take 1 tablet (4 mg total) by mouth every 6 (six) hours as needed for nausea. 20 tablet 0   traMADol  (ULTRAM ) 50 MG tablet Take 1 tablet (50 mg total) by mouth every 6 (six) hours as needed for moderate pain (pain score 4-6). 10 tablet 0   TYLENOL  500 MG tablet Take 500 mg by mouth every 6 (six) hours as needed (for pain).     No current facility-administered medications for this visit.    REVIEW OF SYSTEMS:   Constitutional: ( - ) fevers, ( - )  chills , ( - )  night sweats Eyes: ( - ) blurriness of vision, ( - ) double vision, ( - ) watery eyes Ears, nose, mouth, throat, and face: ( - ) mucositis, ( - ) sore throat Respiratory: ( - ) cough, ( - ) dyspnea, ( - ) wheezes Cardiovascular: ( - ) palpitation, ( - ) chest discomfort, ( - ) lower extremity swelling Gastrointestinal:  ( - ) nausea, ( - ) heartburn, ( - ) change in bowel habits Skin: ( - ) abnormal skin rashes Lymphatics: ( - ) new lymphadenopathy, ( - ) easy bruising Neurological: ( - ) numbness, ( - ) tingling, ( - ) new weaknesses Behavioral/Psych: ( - ) mood change, ( - ) new changes  All other systems were reviewed with the patient and are negative.  PHYSICAL EXAMINATION: Vitals:   08/03/24 1504  BP: 105/66  Pulse: 76  Resp: 14  Temp: 97.9 F (36.6 C)  SpO2: 100%        Constitutional: Oriented to person, place, and time and well-developed, well-nourished, and in no distress.  HENT:  Head: Normocephalic and atraumatic.  Eyes: Conjunctivae are normal. Right eye exhibits no discharge. Left eye exhibits no discharge. No scleral icterus.  Cardiovascular: Normal rate, regular rhythm, normal heart sounds Pulmonary/Chest: Effort normal and breath sounds normal. No respiratory distress. No wheezes. No rales.  Musculoskeletal: Normal range of motion. Exhibits no  edema.  Neurological: Alert and oriented to person, place, and time. Exhibits normal muscle tone. Gait normal. Coordination normal.  Skin: Skin is warm and dry. No rash noted. Not diaphoretic. No erythema. No pallor.  Psychiatric: Mood, memory and judgment normal.    LABORATORY DATA:  I have reviewed the data as listed    Latest Ref Rng & Units 08/03/2024    2:35 PM 07/04/2024    9:54 AM 06/19/2024    4:38 AM  CBC  WBC 4.0 - 10.5 K/uL 4.2  3.5  3.9   Hemoglobin 13.0 - 17.0 g/dL 8.2  9.0  7.3   Hematocrit 39.0 - 52.0 % 24.5  27.3  22.5   Platelets 150 - 400 K/uL 167  152  100        Latest Ref Rng & Units 08/03/2024    2:35 PM 07/04/2024    9:54 AM 06/19/2024    4:38 AM  CMP  Glucose 70 - 99 mg/dL 885  880  89   BUN 8 - 23 mg/dL 43  26  15   Creatinine 0.61 - 1.24 mg/dL 8.83  8.85  8.93   Sodium 135 - 145 mmol/L 136  139  141   Potassium 3.5 - 5.1 mmol/L 4.3  4.4  3.6   Chloride 98 - 111 mmol/L 99  104  107   CO2 22 - 32 mmol/L 24  28  26    Calcium 8.9 - 10.3 mg/dL 9.4  9.3  7.8   Total Protein 6.5 - 8.1 g/dL 7.4  6.8    Total Bilirubin 0.0 - 1.2 mg/dL 0.4  0.6    Alkaline Phos 38 - 126 U/L 66  64    AST 15 - 41 U/L 41  22    ALT 0 - 44 U/L 6  6      RADIOGRAPHIC STUDIES: No results found.   ASSESSMENT & PLAN Todd Holmes is a 89 y.o. male with medical history significant for Castration-Resistant Advanced Prostate Cancer  presents for a follow up visit.   # Castration-Resistant Advanced Prostate Cancer  --  Continue Eligard  therapy with urology.  Continue to receive as scheduled. --Patient completed a full 6 cycles of Pluvicto  therapy --Labs from today showed WBC 4.2, hemoglobin 8.2, MCV 88.8, platelets 167.  Creatinine 1.16 with normal LFTs --Patient is having progression of disease based on rising PSA and PSMA scan showing progression. --Today I discussed the options moving forward.  I do anticipate he is less than 6 months of life remaining.  He was agreeable to  referral to hospice.  This was made at last visit but not followed through.  Will assure this happens in the near future. -- Return to clinic in approximately 6 weeks weeks.    No orders of the defined types were placed in this encounter.  All questions were answered. The patient knows to call the clinic with any problems, questions or concerns.   I have spent a total of 25 minutes minutes of face-to-face and non-face-to-face time, preparing to see the patient, performing a medically appropriate examination, counseling and educating the patient,  documenting clinical information in the electronic health record, independently interpreting results and communicating results to the patient, and care coordination.    Norleen IVAR Kidney, MD Department of Hematology/Oncology [facility] at [facility] Phone: [phone] Pager: [phone] Email: [email or web address]    08/05/2024 5:28 PM

## 2024-08-08 ENCOUNTER — Ambulatory Visit (HOSPITAL_COMMUNITY)
Admission: RE | Admit: 2024-08-08 | Discharge: 2024-08-08 | Disposition: A | Discharge status: Home / Self Care | Attending: Radiology | Admitting: Radiology

## 2024-08-08 ENCOUNTER — Other Ambulatory Visit (HOSPITAL_COMMUNITY): Payer: Self-pay | Admitting: Radiology

## 2024-08-08 DIAGNOSIS — N134 Hydroureter: Secondary | ICD-10-CM

## 2024-08-08 DIAGNOSIS — Z436 Encounter for attention to other artificial openings of urinary tract: Secondary | ICD-10-CM | POA: Insufficient documentation

## 2024-08-08 HISTORY — PX: IR NEPHROSTOMY EXCHANGE LEFT: IMG6069

## 2024-08-08 MED ORDER — LIDOCAINE-EPINEPHRINE 1 %-1:100000 IJ SOLN
INTRAMUSCULAR | Status: AC
  Filled 2024-08-08: qty 1

## 2024-08-08 MED ORDER — LIDOCAINE-EPINEPHRINE 1 %-1:100000 IJ SOLN
20.0000 mL | Freq: Once | INTRAMUSCULAR | Status: AC
  Administered 2024-08-08: 20 mL via INTRADERMAL

## 2024-08-08 MED ORDER — IOHEXOL 300 MG/ML  SOLN
50.0000 mL | Freq: Once | INTRAMUSCULAR | Status: AC | PRN
  Administered 2024-08-08: 30 mL

## 2024-08-30 ENCOUNTER — Emergency Department (HOSPITAL_COMMUNITY)

## 2024-08-30 ENCOUNTER — Encounter (HOSPITAL_COMMUNITY): Payer: Self-pay | Admitting: *Deleted

## 2024-08-30 ENCOUNTER — Other Ambulatory Visit: Payer: Self-pay

## 2024-08-30 ENCOUNTER — Emergency Department (HOSPITAL_COMMUNITY)
Admission: EM | Admit: 2024-08-30 | Discharge: 2024-08-30 | Disposition: A | Discharge status: Home / Self Care | Attending: Emergency Medicine | Admitting: Emergency Medicine

## 2024-08-30 DIAGNOSIS — Z79899 Other long term (current) drug therapy: Secondary | ICD-10-CM | POA: Insufficient documentation

## 2024-08-30 DIAGNOSIS — Y732 Prosthetic and other implants, materials and accessory gastroenterology and urology devices associated with adverse incidents: Secondary | ICD-10-CM | POA: Diagnosis not present

## 2024-08-30 DIAGNOSIS — I10 Essential (primary) hypertension: Secondary | ICD-10-CM | POA: Insufficient documentation

## 2024-08-30 DIAGNOSIS — Z8546 Personal history of malignant neoplasm of prostate: Secondary | ICD-10-CM | POA: Insufficient documentation

## 2024-08-30 DIAGNOSIS — T83022A Displacement of nephrostomy catheter, initial encounter: Secondary | ICD-10-CM | POA: Diagnosis present

## 2024-08-30 HISTORY — PX: IR NEPHROSTOMY EXCHANGE RIGHT: IMG6070

## 2024-08-30 MED ORDER — LIDOCAINE HCL 1 % IJ SOLN
20.0000 mL | Freq: Once | INTRAMUSCULAR | Status: AC
  Administered 2024-08-30: 20:00:00 4 mL via INTRADERMAL

## 2024-08-30 MED ORDER — LIDOCAINE HCL 1 % IJ SOLN
INTRAMUSCULAR | Status: AC
  Filled 2024-08-30: qty 20

## 2024-08-30 MED ORDER — IOHEXOL 300 MG/ML  SOLN
50.0000 mL | Freq: Once | INTRAMUSCULAR | Status: AC | PRN
  Administered 2024-08-30: 20:00:00 20 mL

## 2024-08-30 MED ORDER — SILVER NITRATE-POT NITRATE 75-25 % EX MISC
CUTANEOUS | Status: AC
  Filled 2024-08-30: qty 10

## 2024-08-30 MED ORDER — SILVER NITRATE-POT NITRATE 75-25 % EX MISC
1.0000 | Freq: Once | CUTANEOUS | Status: AC
  Administered 2024-08-30: 20:00:00 1 via TOPICAL

## 2024-08-30 MED ORDER — OXYCODONE HCL 5 MG PO TABS
5.0000 mg | ORAL_TABLET | Freq: Once | ORAL | Status: AC
  Administered 2024-08-30: 19:00:00 5 mg via ORAL
  Filled 2024-08-30: qty 1

## 2024-08-30 NOTE — ED Provider Notes (Signed)
 [place] EMERGENCY DEPARTMENT AT [facility] Provider Note   CSN: [id number] Arrival date & time: 08/30/24  1633     Patient presents with: Nephrostomy tube out   Todd Holmes is a 89 y.o. male on hospice presenting by EMS with cousin (caretaker at bedside), with concern for accidental right sided nephrostomy removal  Bilateral n-tubes placed on 06/13/24 by IR whose consult note reads as follows:  History of HTN, benign tremors, metastatic prostate cancer.Presented to to the ED at [facility] on 9.28.25 with flank pain. Found to have bilateral hydronephrosis in AKI. CT Abd pelvis from 9.28.25 reads nterval development of bilateral mild to moderate hydroureteronephrosis. No nephroureterolithiasis bilaterally. This may reflect the changes of obstructive uropathy or reflux.   HPI     Prior to Admission medications  Medication Sig Start Date End Date Taking? Authorizing Provider  amLODipine  (NORVASC ) 5 MG tablet Take 1 tablet (5 mg total) by mouth daily. 06/19/24   Pokhrel, Laxman, MD  bacitracin -polymyxin b  (POLYSPORIN ) ophthalmic ointment Place into both eyes 2 (two) times daily. apply to eye every 12 hours while awake 06/19/24   Pokhrel, Laxman, MD  escitalopram  (LEXAPRO ) 10 MG tablet Take 10 mg by mouth in the morning. 05/07/22   [provider]  melatonin 5 MG TABS Take 5 mg by mouth at bedtime as needed (for sleep).    [provider]  Multiple Vitamin (MULTIVITAMIN) tablet Take 1 tablet by mouth daily with breakfast.    [provider]  MYRBETRIQ  25 MG TB24 tablet Take 25 mg by mouth in the morning. 05/07/22   [provider]  ondansetron  (ZOFRAN ) 4 MG tablet Take 1 tablet (4 mg total) by mouth every 6 (six) hours as needed for nausea. 02/24/24   Cheryle Page, MD  traMADol  (ULTRAM ) 50 MG tablet Take 1 tablet (50 mg total) by mouth every 6 (six) hours as needed for moderate pain (pain score 4-6). 08/05/24   Dorsey, John T IV, MD  TYLENOL   500 MG tablet Take 500 mg by mouth every 6 (six) hours as needed (for pain).    [provider]    Allergies: Patient has no known allergies.    Review of Systems  Updated Vital Signs BP 109/62 (BP Location: Left Arm)   Pulse 74   Temp 97.9 F (36.6 C) (Oral)   Resp 18   SpO2 96%   Physical Exam Constitutional:      General: He is not in acute distress.    Comments: Thin, frail, cachectic  HENT:     Head: Normocephalic and atraumatic.  Eyes:     Conjunctiva/sclera: Conjunctivae normal.     Pupils: Pupils are equal, round, and reactive to light.  Cardiovascular:     Rate and Rhythm: Normal rate and regular rhythm.  Pulmonary:     Effort: Pulmonary effort is normal. No respiratory distress.  Abdominal:     General: There is no distension.     Tenderness: There is no abdominal tenderness.  Musculoskeletal:     Comments: Right-sided nephrostomy tube completely dislodged and removed, no active hemorrhage or bleeding, left-sided nephrostomy tube with yellow urine  Skin:    General: Skin is warm and dry.  Neurological:     General: No focal deficit present.     Mental Status: He is alert. Mental status is at baseline.  Psychiatric:        Mood and Affect: Mood normal.        Behavior: Behavior normal.     (  all labs ordered are listed, but only abnormal results are displayed) Labs Reviewed - No data to display   EKG: None  Radiology: No results found.   Procedures   Medications Ordered in the ED  oxyCODONE  (Oxy IR/ROXICODONE ) immediate release tablet 5 mg (5 mg Oral Given 08/30/24 1902)    Clinical Course as of 08/30/24 1913  Wed Aug 30, 2024  1808 I paged IR Dr Philip  [MT]  [phone] IR at bedside, to plan for nephrostomy tube replacement tonight - doesn't need labs [MT]  1913 To IR suite now for placement; likely discharge home by PTAR after return [MT]    Clinical Course User Index [MT] Cottie Donnice PARAS, MD                                 Medical  Decision Making Amount and/or Complexity of Data Reviewed Labs: ordered.  Risk Prescription drug management.   Patient is here with accidental dislodgment of right-sided nephrostomy tube.  I did review his external records including the operative report and placement of these tubes in September.  Appears he had obstructive uropathy issues at that time as well as the kidney disease and significantly elevated creatinine levels.  Case was discussed with on-call interventional radiologist, Dr Philip, who advised that he can come replace nephrostomy tube tonight  There is no indication for lab imaging per discussion with the interventional list.  Patient is on home hospice.  Supplemental history provided by the patient's cousin as well as EMS.  The patient is a DNR.  They are not wanting aggressive care or hospitalization at this time.  He will need transport back to his house, PTAR to be arranged.     Final diagnoses:  None    ED Discharge Orders     None          [place] Current, Donnice PARAS, MD 08/30/24 [phone]

## 2024-08-30 NOTE — Progress Notes (Signed)
 [facility]  [facility] Liaison Note  This patient is a current hospice patient with [facility], admitted with terminal diagnosis of prostate cancer.  Patient is a DNR code and Geni Endo [phone] is the patient's primary caregiver.  We will continue to follow for any discharge planning needs and to coordinate continuation of hospice care.  Please don't hesitate to call with any Hospice related questions or concerns.  Thank you for the opportunity to participate in this patient's care.  Todd Jacobs RN BSN [facility] Liaison [phone]

## 2024-08-30 NOTE — ED Triage Notes (Signed)
 BIB EMS due to nephrostomy tube coming out. 98/p-90-95% RA-18  Pt is a Hospice pt.  Pt has DNR with him.

## 2024-08-30 NOTE — Procedures (Signed)
 Interventional Radiology Procedure:   Indications: Dislodged right nephrostomy tube   Procedure: Right nephrostomy tube exchange / replacement  Findings: 10 Fr tube placed in right renal pelvis.  Bleeding granulation tissue treated with silver  nitrate.   Complications: None     EBL: Minimal  Plan:  Continue with routine exchanges of bilateral nephrostomy tubes.   Johny Pitstick R. Philip, MD  Pager: [phone]

## 2024-09-27 ENCOUNTER — Inpatient Hospital Stay: Admitting: Physician Assistant

## 2024-09-27 ENCOUNTER — Inpatient Hospital Stay

## 2024-10-03 ENCOUNTER — Other Ambulatory Visit (HOSPITAL_COMMUNITY)

## 2024-10-15 DEATH — deceased
